# Patient Record
Sex: Female | Born: 1960 | Race: Black or African American | Hispanic: No | Marital: Single | Smoking: Never smoker
Health system: Southern US, Community
[De-identification: ages and names within clinical notes are randomized; demographics above are authoritative.]

## PROBLEM LIST (undated history)

## (undated) DIAGNOSIS — F32A Depression, unspecified: Secondary | ICD-10-CM

## (undated) DIAGNOSIS — D259 Leiomyoma of uterus, unspecified: Secondary | ICD-10-CM

## (undated) DIAGNOSIS — C50919 Malignant neoplasm of unspecified site of unspecified female breast: Secondary | ICD-10-CM

## (undated) DIAGNOSIS — Z9221 Personal history of antineoplastic chemotherapy: Secondary | ICD-10-CM

## (undated) DIAGNOSIS — C50411 Malignant neoplasm of upper-outer quadrant of right female breast: Secondary | ICD-10-CM

## (undated) DIAGNOSIS — F329 Major depressive disorder, single episode, unspecified: Secondary | ICD-10-CM

## (undated) HISTORY — DX: Malignant neoplasm of upper-outer quadrant of right female breast: C50.411

## (undated) HISTORY — DX: Major depressive disorder, single episode, unspecified: F32.9

## (undated) HISTORY — DX: Depression, unspecified: F32.A

## (undated) HISTORY — DX: Leiomyoma of uterus, unspecified: D25.9

## (undated) HISTORY — PX: NECK SURGERY: SHX720

---

## 1998-07-23 ENCOUNTER — Other Ambulatory Visit: Admission: RE | Admit: 1998-07-23 | Discharge: 1998-07-23 | Payer: Self-pay | Admitting: *Deleted

## 1999-10-05 ENCOUNTER — Other Ambulatory Visit: Admission: RE | Admit: 1999-10-05 | Discharge: 1999-10-05 | Payer: Self-pay | Admitting: Obstetrics & Gynecology

## 2000-12-07 ENCOUNTER — Other Ambulatory Visit: Admission: RE | Admit: 2000-12-07 | Discharge: 2000-12-07 | Payer: Self-pay | Admitting: Obstetrics & Gynecology

## 2002-01-31 ENCOUNTER — Encounter: Admission: RE | Admit: 2002-01-31 | Discharge: 2002-01-31 | Payer: Self-pay | Admitting: Obstetrics & Gynecology

## 2002-01-31 ENCOUNTER — Encounter: Payer: Self-pay | Admitting: Obstetrics & Gynecology

## 2003-03-27 ENCOUNTER — Encounter: Admission: RE | Admit: 2003-03-27 | Discharge: 2003-03-27 | Payer: Self-pay | Admitting: Obstetrics & Gynecology

## 2004-05-12 ENCOUNTER — Ambulatory Visit (HOSPITAL_COMMUNITY): Admission: RE | Admit: 2004-05-12 | Discharge: 2004-05-12 | Payer: Self-pay | Admitting: Obstetrics & Gynecology

## 2004-05-14 ENCOUNTER — Encounter: Admission: RE | Admit: 2004-05-14 | Discharge: 2004-05-14 | Payer: Self-pay | Admitting: Family Medicine

## 2004-07-12 ENCOUNTER — Ambulatory Visit (HOSPITAL_COMMUNITY): Admission: RE | Admit: 2004-07-12 | Discharge: 2004-07-13 | Payer: Self-pay | Admitting: Neurosurgery

## 2005-05-16 ENCOUNTER — Ambulatory Visit (HOSPITAL_COMMUNITY): Admission: RE | Admit: 2005-05-16 | Discharge: 2005-05-16 | Payer: Self-pay | Admitting: *Deleted

## 2006-03-08 ENCOUNTER — Ambulatory Visit: Payer: Self-pay | Admitting: Family Medicine

## 2006-03-08 LAB — CONVERTED CEMR LAB
AST: 17 units/L (ref 0–37)
BUN: 9 mg/dL (ref 6–23)
Basophils Absolute: 0 10*3/uL (ref 0.0–0.1)
Basophils Relative: 0.8 % (ref 0.0–1.0)
Bilirubin, Direct: 0.1 mg/dL (ref 0.0–0.3)
Calcium: 9.1 mg/dL (ref 8.4–10.5)
Chloride: 104 meq/L (ref 96–112)
Creatinine, Ser: 0.7 mg/dL (ref 0.4–1.2)
Eosinophils Absolute: 0 10*3/uL (ref 0.0–0.6)
Free T4: 1 ng/dL (ref 0.6–1.6)
HCT: 37.8 % (ref 36.0–46.0)
HDL: 45.9 mg/dL (ref >39.0)
LDL Cholesterol: 130 mg/dL — ABNORMAL HIGH (ref 0–99)
Monocytes Relative: 11.3 % — ABNORMAL HIGH (ref 3.0–11.0)
RBC: 4.42 M/uL (ref 3.87–5.11)
RDW: 14.1 % (ref 11.5–14.6)
T3, Free: 3.2 pg/mL (ref 2.3–4.2)
TSH: 0.65 microintl units/mL (ref 0.35–5.50)
Total CHOL/HDL Ratio: 4.1
Total Protein: 7.5 g/dL (ref 6.0–8.3)
Triglycerides: 65 mg/dL (ref 0–149)

## 2006-03-15 ENCOUNTER — Ambulatory Visit: Payer: Self-pay | Admitting: Family Medicine

## 2006-04-02 ENCOUNTER — Ambulatory Visit: Payer: Self-pay | Admitting: Family Medicine

## 2006-04-05 ENCOUNTER — Ambulatory Visit: Payer: Self-pay | Admitting: Family Medicine

## 2006-05-21 ENCOUNTER — Ambulatory Visit (HOSPITAL_COMMUNITY): Admission: RE | Admit: 2006-05-21 | Discharge: 2006-05-21 | Payer: Self-pay | Admitting: Family Medicine

## 2007-04-23 ENCOUNTER — Telehealth (INDEPENDENT_AMBULATORY_CARE_PROVIDER_SITE_OTHER): Payer: Self-pay | Admitting: *Deleted

## 2007-05-23 ENCOUNTER — Ambulatory Visit: Payer: Self-pay | Admitting: Family Medicine

## 2007-05-23 DIAGNOSIS — F329 Major depressive disorder, single episode, unspecified: Secondary | ICD-10-CM

## 2007-05-23 DIAGNOSIS — F32A Depression, unspecified: Secondary | ICD-10-CM | POA: Insufficient documentation

## 2007-05-28 ENCOUNTER — Ambulatory Visit (HOSPITAL_COMMUNITY): Admission: RE | Admit: 2007-05-28 | Discharge: 2007-05-28 | Payer: Self-pay | Admitting: Obstetrics & Gynecology

## 2007-06-17 ENCOUNTER — Ambulatory Visit: Payer: Self-pay | Admitting: Family Medicine

## 2007-06-17 DIAGNOSIS — R259 Unspecified abnormal involuntary movements: Secondary | ICD-10-CM | POA: Insufficient documentation

## 2008-03-11 DIAGNOSIS — IMO0001 Reserved for inherently not codable concepts without codable children: Secondary | ICD-10-CM

## 2008-03-17 ENCOUNTER — Ambulatory Visit: Payer: Self-pay | Admitting: Family Medicine

## 2008-05-29 ENCOUNTER — Ambulatory Visit (HOSPITAL_COMMUNITY): Admission: RE | Admit: 2008-05-29 | Discharge: 2008-05-29 | Payer: Self-pay | Admitting: Obstetrics & Gynecology

## 2008-11-09 ENCOUNTER — Ambulatory Visit: Payer: Self-pay | Admitting: Family Medicine

## 2008-11-09 LAB — CONVERTED CEMR LAB
ALT: 11 units/L (ref 0–35)
Albumin: 3.6 g/dL (ref 3.5–5.2)
Alkaline Phosphatase: 40 units/L (ref 39–117)
BUN: 8 mg/dL (ref 6–23)
Bilirubin Urine: NEGATIVE
Bilirubin, Direct: 0.1 mg/dL (ref 0.0–0.3)
CO2: 28 meq/L (ref 19–32)
Calcium: 8.6 mg/dL (ref 8.4–10.5)
Chloride: 104 meq/L (ref 96–112)
Cholesterol: 196 mg/dL (ref 0–200)
Eosinophils Absolute: 0 10*3/uL (ref 0.0–0.7)
GFR calc non Af Amer: 114.9 mL/min (ref >60)
Glucose, Bld: 90 mg/dL (ref 70–99)
HCT: 36.2 % (ref 36.0–46.0)
Lymphocytes Relative: 22.4 % (ref 12.0–46.0)
Lymphs Abs: 0.7 10*3/uL (ref 0.7–4.0)
Specific Gravity, Urine: 1.025 (ref 1.000–1.030)
Total Bilirubin: 0.5 mg/dL (ref 0.3–1.2)
Total CHOL/HDL Ratio: 5
WBC: 3.1 10*3/uL — ABNORMAL LOW (ref 4.5–10.5)
pH: 6.5 (ref 5.0–8.0)

## 2008-11-19 ENCOUNTER — Ambulatory Visit: Payer: Self-pay | Admitting: Family Medicine

## 2008-11-19 DIAGNOSIS — D259 Leiomyoma of uterus, unspecified: Secondary | ICD-10-CM

## 2008-11-19 DIAGNOSIS — K625 Hemorrhage of anus and rectum: Secondary | ICD-10-CM

## 2008-11-20 ENCOUNTER — Encounter (INDEPENDENT_AMBULATORY_CARE_PROVIDER_SITE_OTHER): Payer: Self-pay | Admitting: *Deleted

## 2008-12-16 ENCOUNTER — Encounter (INDEPENDENT_AMBULATORY_CARE_PROVIDER_SITE_OTHER): Payer: Self-pay | Admitting: *Deleted

## 2008-12-16 ENCOUNTER — Ambulatory Visit: Payer: Self-pay | Admitting: Gastroenterology

## 2008-12-16 DIAGNOSIS — K921 Melena: Secondary | ICD-10-CM

## 2008-12-16 DIAGNOSIS — K5900 Constipation, unspecified: Secondary | ICD-10-CM | POA: Insufficient documentation

## 2009-01-06 ENCOUNTER — Ambulatory Visit: Payer: Self-pay | Admitting: Gastroenterology

## 2009-06-15 ENCOUNTER — Ambulatory Visit (HOSPITAL_COMMUNITY): Admission: RE | Admit: 2009-06-15 | Discharge: 2009-06-15 | Payer: Self-pay | Admitting: Family Medicine

## 2009-06-15 LAB — HM MAMMOGRAPHY

## 2009-10-08 ENCOUNTER — Ambulatory Visit: Payer: Self-pay | Admitting: Family Medicine

## 2009-10-08 DIAGNOSIS — F988 Other specified behavioral and emotional disorders with onset usually occurring in childhood and adolescence: Secondary | ICD-10-CM | POA: Insufficient documentation

## 2009-10-14 ENCOUNTER — Telehealth: Payer: Self-pay | Admitting: Family Medicine

## 2009-10-15 ENCOUNTER — Telehealth: Payer: Self-pay | Admitting: Family Medicine

## 2009-10-20 ENCOUNTER — Encounter: Payer: Self-pay | Admitting: Family Medicine

## 2009-11-22 ENCOUNTER — Telehealth: Payer: Self-pay | Admitting: Family Medicine

## 2009-11-25 ENCOUNTER — Telehealth: Payer: Self-pay | Admitting: Family Medicine

## 2009-12-14 ENCOUNTER — Ambulatory Visit: Payer: Self-pay | Admitting: Family Medicine

## 2009-12-14 DIAGNOSIS — M5382 Other specified dorsopathies, cervical region: Secondary | ICD-10-CM | POA: Insufficient documentation

## 2009-12-17 ENCOUNTER — Encounter
Admission: RE | Admit: 2009-12-17 | Discharge: 2010-01-12 | Payer: Self-pay | Source: Home / Self Care | Attending: Family Medicine | Admitting: Family Medicine

## 2009-12-20 ENCOUNTER — Encounter: Payer: Self-pay | Admitting: Family Medicine

## 2009-12-28 ENCOUNTER — Ambulatory Visit: Payer: Self-pay | Admitting: Family Medicine

## 2010-01-19 ENCOUNTER — Telehealth: Payer: Self-pay | Admitting: Family Medicine

## 2010-01-19 ENCOUNTER — Encounter
Admission: RE | Admit: 2010-01-19 | Discharge: 2010-01-19 | Payer: Self-pay | Source: Home / Self Care | Attending: Family Medicine | Admitting: Family Medicine

## 2010-01-20 ENCOUNTER — Telehealth: Payer: Self-pay | Admitting: Family Medicine

## 2010-02-15 NOTE — Progress Notes (Signed)
Summary: Pt says [facility]. Will pick up written   Phone Note Call from Patient Call back at Home Phone [phone]   Caller: Patient Summary of Call: [facility] [address] and no script for Ce[facility]a had been recvd. Pls print out script for Celexa, and pt will pick up at same time she picks up script for the Adderall.  Initial call taken by: Lucy Antigua,  November 25, 2009 10:30 AM    Prescriptions: CELEXA 40 MG  TABS (CITALOPRAM HYDROBROMIDE) 1 tab @ bedtime  #100 Tablet x 3   Entered by:   Kern Reap CMA (AAMA)   Authorized by:   Roderick Pee MD   Signed by:   Kern Reap CMA (AAMA) on 11/25/2009   Method used:   Electronically to        [id number]       [address]       [place], [place]  [address]       Ph: [phone] or [phone]       Fax: [phone]   RxID:   [id number]

## 2010-02-15 NOTE — Progress Notes (Signed)
Summary: REFILL REQUEST  Phone Note Refill Request Message from:  Patient     [phone] on November 22, 2009 12:23 PM  Refills Requested: Medication #1:  CELEXA 40 MG  TABS 1 tab @ bedtime   [facility]  Medication #2:  AMPHETAMINE-DEXTROAMPHETAMINE 10 MG TABS Take 1 tablet by mouth two times a day   Notes: Pt can be reached at [phone] when Rx is ready for p/u.    Initial call taken by: Debbra Riding,  November 22, 2009 12:26 PM  Follow-up for Phone Call        tried to leave a message for the patient to see if she has called the pharmacy to see which dose is available for the Adderall. Follow-up by: Kern Reap CMA Duncan Dull),  November 23, 2009 10:30 AM  Additional Follow-up for Phone Call Additional follow up Details #1::        call [phone]. Additional Follow-up by: Warnell Forester,  November 23, 2009 11:05 AM    New/Updated Medications: AMPHETAMINE-DEXTROAMPHETAMINE 15 MG TABS (AMPHETAMINE-DEXTROAMPHETAMINE) take one tab by mouth once daily   fill in one month AMPHETAMINE-DEXTROAMPHETAMINE 15 MG TABS (AMPHETAMINE-DEXTROAMPHETAMINE) take one tab by mouth once daily   fill in two months Prescriptions: AMPHETAMINE-DEXTROAMPHETAMINE 15 MG TABS (AMPHETAMINE-DEXTROAMPHETAMINE) take one tab by mouth once daily   fill in two months  #30 x 0   Entered by:   Kern Reap CMA (AAMA)   Authorized by:   Roderick Pee MD   Signed by:   Kern Reap CMA (AAMA) on 11/23/2009   Method used:   Print then Give to Patient   RxID:   [id number] AMPHETAMINE-DEXTROAMPHETAMINE 15 MG TABS (AMPHETAMINE-DEXTROAMPHETAMINE) take one tab by mouth once daily   fill in one month  #30 x 0   Entered by:   Kern Reap CMA (AAMA)   Authorized by:   Roderick Pee MD   Signed by:   Kern Reap CMA (AAMA) on 11/23/2009   Method used:   Print then Give to Patient   RxID:   [id number] AMPHETAMINE-DEXTROAMPHETAMINE 15 MG TABS (AMPHETAMINE-DEXTROAMPHETAMINE)  take one tab by mouth once daily  #30 x 0   Entered by:   Kern Reap CMA (AAMA)   Authorized by:   Roderick Pee MD   Signed by:   Kern Reap CMA (AAMA) on 11/23/2009   Method used:   Print then Give to Patient   RxID:   [id number]

## 2010-02-15 NOTE — Progress Notes (Signed)
Summary: Pt said [facility]  Phone Note Call from Patient Call back at [facility] Phone [phone]   Caller: Patient Summary of Call: Pt called and she took [facility]. All pharmacy so far have said that they are out of stock and recommended a diff med be prescribed.  Initial call taken by: Lucy Antigua,  October 14, 2009 10:38 AM  Follow-up for Phone Call        Fleet Contras please call have her try [facility] or  another pharmacy Follow-up by: Roderick Pee MD,  October 14, 2009 11:06 AM  Additional Follow-up for Phone Call Additional follow up Details #1::        tried to call home number but machine is full.  she could also see if the adderall comes in a diffferent dose Additional Follow-up by: Kern Reap CMA Duncan Dull),  October 14, 2009 11:18 AM    Additional Follow-up for Phone Call Additional follow up Details #2::    patient checked and insurance will cover 5mg  Follow-up by: Kern Reap CMA Duncan Dull),  October 14, 2009 2:40 PM  New/Updated Medications: AMPHETAMINE-DEXTROAMPHETAMINE 5 MG TABS (AMPHETAMINE-DEXTROAMPHETAMINE) take one tab by mouth two times a day Prescriptions: AMPHETAMINE-DEXTROAMPHETAMINE 5 MG TABS (AMPHETAMINE-DEXTROAMPHETAMINE) take one tab by mouth two times a day  #60 x 0   Entered by:   Kern Reap CMA (AAMA)   Authorized by:   Roderick Pee MD   Signed by:   Kern Reap CMA (AAMA) on 10/14/2009   Method used:   Print then Give to Patient   RxID:   [id number]

## 2010-02-15 NOTE — Progress Notes (Signed)
Summary: refill  Phone Note Call from Patient   Summary of Call: still unable to get the dose of generic adderall  Follow-up for Phone Call        7.5,12.5,15 mg 10 brand name available Follow-up by: Kern Reap CMA Duncan Dull),  October 15, 2009 4:39 PM  Additional Follow-up for Phone Call Additional follow up Details #1::        rx for 5mg  brought in by patient and discarded. Additional Follow-up by: Kern Reap CMA Duncan Dull),  October 15, 2009 4:47 PM    New/Updated Medications: AMPHETAMINE-DEXTROAMPHETAMINE 15 MG TABS (AMPHETAMINE-DEXTROAMPHETAMINE) take one tab by mouth once daily Prescriptions: AMPHETAMINE-DEXTROAMPHETAMINE 15 MG TABS (AMPHETAMINE-DEXTROAMPHETAMINE) take one tab by mouth once daily  #30 x 0   Entered by:   Kern Reap CMA (AAMA)   Authorized by:   Roderick Pee MD   Signed by:   Kern Reap CMA (AAMA) on 10/15/2009   Method used:   Print then Give to Patient   RxID:   [id number]

## 2010-02-15 NOTE — Assessment & Plan Note (Signed)
Summary: CPX // RS rsc per doc/njr   Vital Signs:  Patient profile:   50 year old female Menstrual status:  regular Height:      63.25 inches Weight:      174 pounds BMI:     30.69 Temp:     98.5 degrees F oral BP sitting:   120 / 76  (left arm) Cuff size:   regular  Vitals Entered By: Kern Reap CMA Duncan Dull) (October 08, 2009 9:08 AM) CC: cpx Is Patient Diabetic? No   Primary Care Provider:  Eugenio Hoes. Tawanna Cooler, MD   CC:  cpx.  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  She's had no chronic health problems.  Last year.  We saw her because of some rectal bleeding.  Colonoscopy was normal except for some internal hemorrhoids.  As long as she avoids constipation, then she has no bleeding.  Advised to take a stool softener on a daily basis.  She has a large fibroid, which extends just below her umbilicus.  She is on BCPs via GYN.  They are encouraging her to have a hysterectomy however, she is asymptomatic.  In reviewing her medical history she has a long-standing history of very subtle ADD.  She states she has difficulty sitting and reading and focusing and concentrating.  She's been able to overcome this.  She went to high school college in graduate school and did well, but no she could have done better.  She now has a job where she has multiple tests performed on a daily basis and is having extreme difficulty focusing and concentrating.  We discussed options.  We will give her a trial of Adderall.  She gets routine eye care, dental care, and you mammography, pelvic, by GYN, tetanus booster today.  Seasonal flu shot today  Allergies (verified): No Known Drug Allergies  Past History:  Past medical, surgical, family and social histories (including risk factors)  reviewed, and no changes noted (except as noted below).  Past Medical History: Reviewed history from 12/16/2008 and no changes required. Depression Uterine fibroid  Past Surgical History: Reviewed history from 12/16/2008 and no changes required. Lumbar disk surgery  Family History: Reviewed history from 12/16/2008 and no changes required. Family History of Alcoholism/Addiction No FH of Colon Cancer:  Social History: Reviewed history from 12/16/2008 and no changes required. 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 No apparent alopecia or balding. Eyes:  No corneal or conjunctival inflammation noted. EOMI. Perrla. Funduscopic exam benign, without hemorrhages, exudates or papilledema. Vision grossly normal. Ears:  External ear exam shows no significant lesions or deformities.  Otoscopic examination reveals clear canals, tympanic membranes are intact bilaterally without bulging, retraction, inflammation or discharge. Hearing is grossly normal bilaterally. Nose:  External nasal examination shows no deformity or inflammation. Nasal mucosa are pink and moist without lesions or exudates. Mouth:  Oral mucosa and oropharynx without lesions or exudates.  Teeth in good repair. Neck:  No deformities, masses, or tenderness noted. Chest Wall:  No deformities, masses, or tenderness  noted. Breasts:  No mass, nodules, thickening, tenderness, bulging, retraction, inflamation, nipple discharge or skin changes noted.   Lungs:  Normal respiratory effort, chest expands symmetrically. Lungs are clear to auscultation, no crackles or wheezes. Heart:  Normal rate and regular  rhythm. S1 and S2 normal without gallop, murmur, click, rub or other extra sounds. Abdomen:  abdominal exam negative, except they can palpate her uterus is just below the umbilicus Msk:  No deformity or scoliosis noted of thoracic or lumbar spine.   Pulses:  R and L carotid,radial,femoral,dorsalis pedis and posterior tibial pulses are full and equal bilaterally Extremities:  No clubbing, cyanosis, edema, or deformity noted with normal full range of motion of all joints.   Neurologic:  No cranial nerve deficits noted. Station and gait are normal. Plantar reflexes are down-going bilaterally. DTRs are symmetrical throughout. Sensory, motor and coordinative functions appear intact. Cervical Nodes:  No lymphadenopathy noted Axillary Nodes:  No palpable lymphadenopathy Inguinal Nodes:  No significant adenopathy Psych:  Cognition and judgment appear intact. Alert and cooperative with normal attention span and concentration. No apparent delusions, illusions, hallucinations   Impression & Recommendations:  Problem # 1:  Preventive Health Care (ICD-V70.0) Assessment Unchanged  Problem # 2:  ATTENTION DEFICIT DISORDER, ADULT (ICD-314.00) Assessment: New  Complete Medication List: 1)  Celexa 40 Mg Tabs (Citalopram hydrobromide) .Marland Kitchen.. 1 tab @ bedtime 2)  Bee Pollen 580 Mg Caps (Bee pollen) .... Once daily 3)  Phillips Milk of Magnesia 7.75 % Susp (Magnesium hydroxide) .... Once or twice a week as needed 4)  Cryselle-28 0.3-30 Mg-mcg Tabs (Norgestrel-ethinyl estradiol) .... Take one tab once daily 5)  Amphetamine-dextroamphetamine 10 Mg Tabs (Amphetamine-dextroamphetamine) .... Take 1 tablet by mouth two times a day  Other Orders: Tdap => 8yrs IM (42595) Admin 1st Vaccine (63875) Flu Vaccine 58yrs + (64332) Admin of Any Addtl Vaccine (95188)  Patient Instructions: 1)  begin Adderall 10 mg twice daily.  Return in two weeks for follow-up. 2)  Take an Aspirin every day. 3)  Please schedule a  follow-up appointment in 1 year. Prescriptions: AMPHETAMINE-DEXTROAMPHETAMINE 10 MG TABS (AMPHETAMINE-DEXTROAMPHETAMINE) Take 1 tablet by mouth two times a day  #60 x 0   Entered and Authorized by:   Roderick Pee MD   Signed by:   Roderick Pee MD on 10/08/2009   Method used:   Print then Give to Patient   RxID:   [id number]    Immunizations Administered:  Tetanus Vaccine:    Vaccine Type: Tdap    Site: right deltoid    Mfr: GlaxoSmithKline    Dose: 0.5 ml    Route: IM    Given by: Kern Reap CMA (AAMA)    Exp. Date: 11/05/2011    Lot #: [id number]    Physician counseled: yes  Influenza Vaccine # 1:    Vaccine Type: Fluvax 3+    Site: left deltoid    Mfr: GlaxoSmithKline    Dose: 0.5 ml    Route: IM    Given by: Kern Reap CMA (AAMA)    Exp. Date: 07/16/2010    Lot #: [id number]    VIS given: 08/10/09 version given October 08, 2009.    Physician counseled: yes

## 2010-02-15 NOTE — Assessment & Plan Note (Signed)
Summary: arm issues//ccm   Vital Signs:  Patient profile:   50 year old female Menstrual status:  regular Weight:      172 pounds Temp:     98.0 degrees F oral BP sitting:   120 / 80  (left arm) Cuff size:   regular  Vitals Entered By: Kern Reap CMA Duncan Dull) (December 14, 2009 2:45 PM) CC: right arm pain   Primary Care Provider:  Eugenio Hoes. Tawanna Cooler, MD   CC:  right arm pain.  History of Present Illness: Misty Snyder is a 50 year old female, who comes in today for evaluation of pain in her right shoulder x 2 weeks.  She states about 5 years ago.  She had a cervical disk removed by Dr. Dutch Quint her neurosurgeon.  She states she told him at that time, but there was another disk involved.  She's been symptom-free until about 3 weeks ago.  It started gradually.  Now the pain is constant, dull, a 7 on a scale of one to 10 and is in her right shoulder.  She states she feels some tingling in her right hand  Allergies: No Known Drug Allergies  Past History:  Past medical, surgical, family and social histories (including risk factors) reviewed for relevance to current acute and chronic problems.  Past Medical History: Reviewed history from 12/16/2008 and no changes required. Depression Uterine fibroid  Past Surgical History: Reviewed history from 12/16/2008 and no changes required. Lumbar disk surgery  Family History: Reviewed history from 12/16/2008 and no changes required. Family History of Alcoholism/Addiction No FH of Colon Cancer:  Social History: Reviewed history from 12/16/2008 and no changes required. 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  No apparent alopecia or balding. Eyes:  No corneal or conjunctival inflammation noted. EOMI. Perrla. Funduscopic exam benign, without hemorrhages, exudates or papilledema. Vision grossly normal. Ears:  External ear exam shows no significant lesions or deformities.  Otoscopic examination reveals clear canals, tympanic membranes are intact bilaterally without bulging, retraction, inflammation or discharge. Hearing is grossly normal bilaterally. Nose:  External nasal examination shows no deformity or inflammation. Nasal mucosa are pink and moist without lesions or exudates. Mouth:  Oral mucosa and oropharynx without lesions or exudates.  Teeth in good repair. Neck:  No deformities, masses, or tenderness noted. Chest Wall:  No deformities, masses, or tenderness noted. Msk:  No deformity or scoliosis noted of thoracic or lumbar spine.   Pulses:  R and L carotid,radial,femoral,dorsalis pedis and posterior tibial pulses are full and equal bilaterally Extremities:  No clubbing, cyanosis, edema, or deformity noted with normal full range of motion of all joints.   Neurologic:  No cranial nerve deficits noted. Station and gait are normal. Plantar reflexes are down-going bilaterally. DTRs are symmetrical throughout. Sensory, motor and coordinative functions appear intact.   Problems:  Medical Problems Added: 1)  Dx of Cervical Disorder, Nos  (ICD-723.9)  Impression & Recommendations:  Problem # 1:  CERVICAL DISORDER, NOS (ICD-723.9) Assessment New  Orders: Physical Therapy Referral (PT)  Complete Medication List: 1)  Celexa 40 Mg Tabs (Citalopram hydrobromide) .Marland Kitchen.. 1 tab @ bedtime 2)  Bee Pollen 580 Mg Caps (Bee pollen) .... Once daily 3)  Phillips Milk of Magnesia 7.75 % Susp (Magnesium  hydroxide) .... Once or twice a week as needed 4)  Cryselle-28 0.3-30 Mg-mcg Tabs (Norgestrel-ethinyl estradiol) .... Take one tab once daily 5)  Amphetamine-dextroamphetamine 15 Mg Tabs  (Amphetamine-dextroamphetamine) .... Take one tab by mouth once daily 6)  Amphetamine-dextroamphetamine 15 Mg Tabs (Amphetamine-dextroamphetamine) .... Take one tab by mouth once daily   fill in one month 7)  Amphetamine-dextroamphetamine 15 Mg Tabs (Amphetamine-dextroamphetamine) .... Take one tab by mouth once daily   fill in two months 8)  Flexeril 10 Mg Tabs (Cyclobenzaprine hcl) .Marland Kitchen.. 1 tab @ bedtime 9)  Vicodin Es 7.5-750 Mg Tabs (Hydrocodone-acetaminophen) .Marland Kitchen.. 1 tab @ bedtime  Patient Instructions: 1)  takes 600 mg of Motrin twice daily with food and one half of a Vicodin and Flexeril at bedtime.  Also wear a soft cervical collar at bedtime. 2)  We will get you set up to start physical therapy ASAP. 3)  Return in two weeks for follow-up, sooner if any problems Prescriptions: VICODIN ES 7.5-750 MG TABS (HYDROCODONE-ACETAMINOPHEN) 1 tab @ bedtime  #30 x 1   Entered and Authorized by:   Roderick Pee MD   Signed by:   Roderick Pee MD on 12/14/2009   Method used:   Print then Give to Patient   RxID:   [id number] FLEXERIL 10 MG TABS (CYCLOBENZAPRINE HCL) 1 tab @ bedtime  #30 x 1   Entered and Authorized by:   Roderick Pee MD   Signed by:   Roderick Pee MD on 12/14/2009   Method used:   Print then Give to Patient   RxID:   [id number]    Orders Added: 1)  Physical Therapy Referral [PT] 2)  Est. Patient Level III [16010]

## 2010-02-15 NOTE — Miscellaneous (Signed)
Summary: Initial Summary for PT Services/[facility]  Initial Summary for PT Services/[facility]   Imported By: Maryln Gottron 12/22/2009 13:50:45  _____________________________________________________________________  External Attachment:    Type:   Image     Comment:   External Document

## 2010-02-15 NOTE — Medication Information (Signed)
Summary: Amphetamine Salt Combo Approved  Amphetamine Salt Combo Approved   Imported By: Maryln Gottron 10/25/2009 10:41:15  _____________________________________________________________________  External Attachment:    Type:   Image     Comment:   External Document

## 2010-02-17 NOTE — Progress Notes (Signed)
Summary: refill  Phone Note Refill Request   Refills Requested: Medication #1:  AMPHETAMINE-DEXTROAMPHETAMINE 15 MG TABS take one tab by mouth once daily    New/Updated Medications: AMPHETAMINE-DEXTROAMPHETAMINE 20 MG TABS (AMPHETAMINE-DEXTROAMPHETAMINE) take one tab once daily AMPHETAMINE-DEXTROAMPHETAMINE 20 MG TABS (AMPHETAMINE-DEXTROAMPHETAMINE) take one tab once daily  fill in one month AMPHETAMINE-DEXTROAMPHETAMINE 20 MG TABS (AMPHETAMINE-DEXTROAMPHETAMINE) take one tab once daily fill in two months Prescriptions: AMPHETAMINE-DEXTROAMPHETAMINE 20 MG TABS (AMPHETAMINE-DEXTROAMPHETAMINE) take one tab once daily fill in two months  #30 x 0   Entered by:   Kern Reap CMA (AAMA)   Authorized by:   Roderick Pee MD   Signed by:   Kern Reap CMA (AAMA) on 01/20/2010   Method used:   Print then Give to Patient   RxID:   [id number] AMPHETAMINE-DEXTROAMPHETAMINE 20 MG TABS (AMPHETAMINE-DEXTROAMPHETAMINE) take one tab once daily  fill in one month  #30 x 0   Entered by:   Kern Reap CMA (AAMA)   Authorized by:   Roderick Pee MD   Signed by:   Kern Reap CMA (AAMA) on 01/20/2010   Method used:   Print then Give to Patient   RxID:   [id number] AMPHETAMINE-DEXTROAMPHETAMINE 20 MG TABS (AMPHETAMINE-DEXTROAMPHETAMINE) take one tab once daily  #30 x 0   Entered by:   Kern Reap CMA (AAMA)   Authorized by:   Roderick Pee MD   Signed by:   Kern Reap CMA (AAMA) on 01/20/2010   Method used:   Print then Give to Patient   RxID:   [id number]

## 2010-02-17 NOTE — Progress Notes (Signed)
Summary: Change to Adderall dosage & Refill  Phone Note Call from Patient   Reason for Call: Refill Medication, Talk to Nurse, Talk to Doctor Summary of Call: Pt. is out of Adderall needs refill & wants to see if she can start being prescribed for 20mg  instead of 15mg . Please call her at [phone]. Thanks! Says the 15mg  is not working for her. [facility] Initial call taken by: Georgian Co,  January 19, 2010 8:28 AM  Follow-up for Phone Call        ok Follow-up by: Roderick Pee MD,  January 20, 2010 7:49 AM     Appended Document: Change to Adderall dosage & Refill ok

## 2010-02-25 ENCOUNTER — Telehealth: Payer: Self-pay | Admitting: Family Medicine

## 2010-02-25 DIAGNOSIS — M791 Myalgia, unspecified site: Secondary | ICD-10-CM

## 2010-02-25 MED ORDER — CYCLOBENZAPRINE HCL 10 MG PO TABS: 10.0000 mg | ORAL_TABLET | Freq: Every evening | ORAL | Status: DC | PRN

## 2010-02-25 MED ORDER — CYCLOBENZAPRINE HCL 10 MG PO TABS: 10.0000 mg | ORAL_TABLET | Freq: Every evening | ORAL | Status: AC | PRN

## 2010-02-25 NOTE — Telephone Encounter (Signed)
Pt needs refill on flexeril 10mg 

## 2010-04-11 ENCOUNTER — Telehealth: Payer: Self-pay | Admitting: Family Medicine

## 2010-04-11 NOTE — Telephone Encounter (Signed)
Pt requesting rx refill of flexeril 10mg  and vicodin 7.5/750 sent to [facility] [address]

## 2010-04-11 NOTE — Telephone Encounter (Signed)
Office visit for evaluation

## 2010-04-21 ENCOUNTER — Telehealth: Payer: Self-pay | Admitting: Family Medicine

## 2010-04-21 MED ORDER — HYDROCODONE-ACETAMINOPHEN 7.5-750 MG PO TABS: ORAL_TABLET | ORAL | Status: DC

## 2010-04-21 MED ORDER — CYCLOBENZAPRINE HCL 10 MG PO TABS: ORAL_TABLET | ORAL | Status: DC

## 2010-04-21 NOTE — Telephone Encounter (Signed)
Flexeril 10 mg, number 10, directions one half tab nightly no refills.  Vicodin ES, number 10, directions one half to one tablet at bedtime for severe pain.  No refills.  Office visit next week to assess

## 2010-04-21 NOTE — Telephone Encounter (Signed)
Okay to fill? 

## 2010-04-21 NOTE — Telephone Encounter (Signed)
Pt needs refill flexeril 10mg  and vicodin es 7.5-750mg . [facility] [address] [phone]

## 2010-04-26 ENCOUNTER — Ambulatory Visit (INDEPENDENT_AMBULATORY_CARE_PROVIDER_SITE_OTHER): Payer: BLUE CROSS/BLUE SHIELD | Admitting: Family Medicine

## 2010-04-26 DIAGNOSIS — F988 Other specified behavioral and emotional disorders with onset usually occurring in childhood and adolescence: Secondary | ICD-10-CM

## 2010-04-26 DIAGNOSIS — M5382 Other specified dorsopathies, cervical region: Secondary | ICD-10-CM

## 2010-04-26 MED ORDER — HYDROCODONE-ACETAMINOPHEN 7.5-750 MG PO TABS: ORAL_TABLET | ORAL | Status: DC

## 2010-04-26 MED ORDER — AMPHETAMINE-DEXTROAMPHETAMINE 20 MG PO TABS: ORAL_TABLET | ORAL | Status: DC

## 2010-04-26 MED ORDER — CYCLOBENZAPRINE HCL 10 MG PO TABS: ORAL_TABLET | ORAL | Status: DC

## 2010-04-26 NOTE — Patient Instructions (Signed)
Continue your current medications except increase the Adderall 20 mg twice daily.  Take a half of the Flexeril and/or Vicodin at bedtime as needed for pain and call Dr. Dalphine Handing office to make an appointment to see him for further evaluation.  Return in September for your annual exam

## 2010-04-26 NOTE — Progress Notes (Signed)
  Subjective:    Patient ID: Misty Snyder, female    DOB: Jun 17, 1960, 50 y.o.   MRN: [id number]  HPIStephanie is a 50 year old single female, who comes in to discuss two problems.  She has a long-standing history of cervical disk disease.  She's had lumbar disk surgery by Dr. Dutch Quint and now has take Flexeril and Vicodin intermittently for neck pain.  We sent her for physical therapy, which he said did not help.  Advised to go back and see Dr. Dutch Quint to have further evaluation.  She also has a history of adult ADD.  She's currently on the short acting Adderall 20 mg daily.  She would like to increase the dose to twice daily because it wears off very quickly.  We also talked about long-term medication    Review of Systems General musculoskeletal and neurologic review of systems otherwise negative    Objective:   Physical Exam    Well-developed female in no acute distress    Assessment & Plan:  Adult ADD, increase the Inderal 20 b.i.d.  Cervical disk disease, Flexeril, and Vicodin p.r.n. Neurosurgical consult for further evaluation.  Return for CPX in the fall

## 2010-05-03 ENCOUNTER — Other Ambulatory Visit: Payer: Self-pay | Admitting: Family Medicine

## 2010-05-03 DIAGNOSIS — Z1231 Encounter for screening mammogram for malignant neoplasm of breast: Secondary | ICD-10-CM

## 2010-06-03 NOTE — Assessment & Plan Note (Signed)
[facility] OFFICE NOTE   NAME:Dantes, ETHERINE MACKOWIAK                   MRN:          [id number]  DATE:03/08/2006                            DOB:          11/09/1960    Senie is a delightful 50 year old single female, gravida 0, para 0,  AB 0 comes in today new patient for evaluation of fatigue, depression,  mood swings, and hypersomnia, PMS-type symptoms all month long, question  thyroid disease.   The patient was hospitalized for back surgery in 2006, recovered without  neurological sequela.   SURGERIES:  None.   ILLNESSES:  None.   INJURIES:  None.   DRUG ALLERGIES:  None.   Does not smoke or drink any alcohol.  Does not use any drugs.   CURRENT MEDICATIONS:  Lo/Ovral BCP.   Menses are regular.  No problem.  Last Pap smear was March 02, 2006.  [facility].   REVIEW OF SYSTEMS:  HEAD, EARS, EYES, NOSE, AND THROAT:  Negative,  except she wears glasses for distance vision.  She gets regular dental  care.  CARDIOPULMONARY:  Negative.  GASTROINTESTINAL:  Negative.  GENITOURINARY:  Negative.  GYN:  She is a gravida 0, para 0.  Menses are  regular.  She is on birth control pills.  She is being seen on a regular  basis and gets a regular mammogram.  ENDOCRINE:  Negative.  Weight  negative.  MUSCULOSKELETAL:  Negative.  VASCULAR:  Negative.  ALLERGY:  Negative.  PSYCH:  The patient complains of fatigue, depression, mood  swings since she has taken over the care of her nephew.  This is her  brother's child.  Her brother is in jail for drug dealing until 2011.  She has assumed the care of the child, who is now 41 years old because  the biological mother is incapable of caring for the child.  She has had  him as a foster child for 2 years, and now she is in the process of  adoption.  She thinks this has triggered all her symptoms.  She is  currently a Geophysicist/field seismologist at [employer].  She is also  going to [employer].   FAMILY HISTORY:  Dad 82, has a history of alcoholism.  Mother is 31,  alive and well, 1 brother with a history of drug addiction.  As noted  above, he is in jail until 2011.  No sisters.   LAST TETANUS:  2002.   PHYSICAL EVALUATION:  Height 5 feet 2 inches.  Weight 173, BP 113/77,  pulse 70 and regular.  She is afebrile.  GENERAL:  She is a well-developed, well-nourished female in no acute  distress.  HEAD, EARS, EYES, NOSE, AND THROAT:  Negative.  NECK:  Supple.  Thyroid is not enlarged.  No carotid bruits.  CHEST:  Clear to auscultation.  CARDIAC:  Negative.  BREASTS:  Negative.  ABDOMEN:  Negative.  PELVIC:  Pelvic, etc., done in __________ negative.  Therefore, not  repeated.  EXTREMITIES:  Normal.  SKIN:  Normal.   IMPRESSION:  Fatigue, mood swings, depression, et Karie Soda, triggered by  care of her 68-year-old boy, whom she is now in the process of adopting.   PLAN:  We will get diagnostic laboratory studies, start on Celexa 20 mg  1/2 tablet nightly.  Follow up in 1 week.  45 minutes was spent with her  going through her H&P et Karie Soda.     Jeffrey A. Tawanna Cooler, MD  Electronically Signed    JAT/MedQ  DD: 03/08/2006  DT: 03/08/2006  Job #: [id number]

## 2010-06-03 NOTE — Op Note (Signed)
NAMESERIAH, BROTZMAN            ACCOUNT NO.:  [id number]   MEDICAL RECORD NO.:  [id number]          PATIENT TYPE:  OIB   LOCATION:  3006                         FACILITY:  [facility]   PHYSICIAN:  Kathaleen Maser. Pool, M.D.    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   PROCEDURE:  C5-6 anterior cervical discectomy and fusion with allograft  anterior plating.   SURGEON:  Kathaleen Maser. Pool, M.D.   Threasa HeadsYetta Barre.   ANESTHESIA:  General endotracheal anesthesia.   INDICATIONS FOR PROCEDURE:  Ms. Grunow is a 50 year old female with history  of a neck pain and upper extremity symptoms.  Work-up demonstrates evidence  of a large central disc herniation at C5-6 with marked spinal cord  compression.  Patient presents now for a C5-6 anterior cervical discectomy  and fusion, allograft anterior plating.   DESCRIPTION OF PROCEDURE:  Patient brought to the operating room in supine  position, after an adequate level of anesthesia achieved, patient was supine  with her neck slightly extended to help with Halter traction.  Patient's  anterior cervical region is prepped and draped sterilely.  A 10 blade was  used to make a linear skin incision overlying the C5-6 inner space.  This  was carried down sharply to the platysma.  Platysma was then divided  vertically and dissection proceeded along the medial border of the  sternocleidomastoid muscle and carotid sheath.  Trachea and esophagus were  mobilized and tracked toward the left.  Prevertebral fascia was __________  anterior spinal column.  Longus colli muscle was then elevated bilaterally  using electrocautery.  Deep self-retaining retractor was placed,  intraoperative fluoroscopy was used and the C5-6 level was  confirmed.  Disc  space was then incised with a 15 blade in a rectangular fashion.  A wide  disc space cleanout was then achieved using pituitary rongeurs forward and  backward ____________ Kerrison rongeurs and high speed drill.  All elements  of the disc were removed down to the level of the posterior annulus.  The  microscope was brought into the field and used throughout the remainder of  the discectomy.  Remaining aspects of the annulus and osteophytes removed  using the high speed drill down to the level of the posterior longitudinal  ligament.  Posterior longitudinal ligament was then elevated and resected in  piecemeal fashion using Kerrison rongeurs.  Underlying thecal sac was then  identified.  A large amount of free disc herniation was encountered and  completely resected.  A wide central decompression was then was then  performed by undercutting the bodies of C5 and C6 using Kerrison rongeurs.  Decompression then proceeded on each neural foramen.  Wide anterior  foraminotomy was then performed along the course of exiting nerve roots  bilaterally.  At this point, a very thorough decompression had been  achieved.  There was no obvious injury to  thecal sac and nerve roots.  Wound  was then irrigated with antibiotic solution.  Gelfoam was placed for  hemostasis and found to be good.  A 6 mm LifeNet allograft wedge was then  impacted into place and recessed approximately 1 mm from the anterior  cervical margin.  A 25 mm Atlantis anterior cervical plate was placed over  the C5 and C6 levels.  This then attached under fluoroscopic guidance using  13 mm variable angle screws, two each of both levels.  All four screws given  a final tightening and found to be solidly in bone.  Locking screws then  engaged.  Final images reveal good position of the bone grafts and hardware  and with proper alignment of the spine.  Wound was then irrigated with  antibiotic solution.  Hemostasis was insured  with bipolar cautery.  Wound  was then closed in typical fashion.  Steri-Strips and sterile dressing were  applied.  There were no apparent complications. The patient tolerated the  procedure well and she returns to the recovery room postoperatively.       HAP/MEDQ  D:  07/12/2004  T:  07/12/2004  Job:  [id number]

## 2010-06-20 ENCOUNTER — Ambulatory Visit (HOSPITAL_COMMUNITY)
Admission: RE | Admit: 2010-06-20 | Discharge: 2010-06-20 | Disposition: A | Discharge status: Home / Self Care | Payer: BLUE CROSS/BLUE SHIELD | Source: Ambulatory Visit | Attending: Family Medicine | Admitting: Family Medicine

## 2010-06-20 DIAGNOSIS — Z1231 Encounter for screening mammogram for malignant neoplasm of breast: Secondary | ICD-10-CM

## 2010-07-18 ENCOUNTER — Other Ambulatory Visit: Payer: Self-pay | Admitting: Family Medicine

## 2010-07-18 DIAGNOSIS — M5382 Other specified dorsopathies, cervical region: Secondary | ICD-10-CM

## 2010-07-18 NOTE — Telephone Encounter (Signed)
Pt req refill of cyclobenzaprine (FLEXERIL) 10 MG tablet and citalopram (CELEXA) 40 MG tablet , Please call pt when ready for pick up.

## 2010-07-19 MED ORDER — CYCLOBENZAPRINE HCL 10 MG PO TABS: ORAL_TABLET | ORAL | Status: DC

## 2010-07-19 MED ORDER — CITALOPRAM HYDROBROMIDE 40 MG PO TABS: 40.0000 mg | ORAL_TABLET | Freq: Every day | ORAL | Status: DC

## 2010-07-19 NOTE — Telephone Encounter (Signed)
Okay to refill it until next physical exam

## 2010-07-19 NOTE — Telephone Encounter (Signed)
rx ready for pick up and patient  Is aware

## 2010-08-09 ENCOUNTER — Telehealth: Payer: Self-pay | Admitting: Family Medicine

## 2010-08-09 DIAGNOSIS — F988 Other specified behavioral and emotional disorders with onset usually occurring in childhood and adolescence: Secondary | ICD-10-CM

## 2010-08-09 MED ORDER — AMPHETAMINE-DEXTROAMPHETAMINE 20 MG PO TABS: 20.0000 mg | ORAL_TABLET | Freq: Two times a day (BID) | ORAL | Status: DC

## 2010-08-09 NOTE — Telephone Encounter (Signed)
rx ready for pick up.  Tried to call but message box is full.

## 2010-08-09 NOTE — Telephone Encounter (Signed)
Pt. Requesting refill onamphetamine-dextroamphetamine (ADDERALL) 20 MG tablet

## 2010-10-11 ENCOUNTER — Encounter: Payer: BLUE CROSS/BLUE SHIELD | Admitting: Family Medicine

## 2010-10-12 ENCOUNTER — Telehealth: Payer: Self-pay | Admitting: Family Medicine

## 2010-10-12 NOTE — Telephone Encounter (Signed)
We are unable to refill your medication,........... In the future only turn in one prescription at a time

## 2010-10-12 NOTE — Telephone Encounter (Signed)
Pt received a 3 months worth of scripts of amphetamine-dextroamphetamine (ADDERALL) 20 MG tablet on 08/09/10 pt dropped script off at pharmacy and they kept all 3. Pt went back to get refill and pharmacy said they had no refills for pt. Pt is requesting remaining 2 months of scripts. Please contact pt.

## 2010-10-12 NOTE — Telephone Encounter (Signed)
Spoke with pharmacy and rx is on hold so they will fill it today

## 2010-11-16 ENCOUNTER — Other Ambulatory Visit (INDEPENDENT_AMBULATORY_CARE_PROVIDER_SITE_OTHER): Payer: BLUE CROSS/BLUE SHIELD

## 2010-11-16 DIAGNOSIS — M5382 Other specified dorsopathies, cervical region: Secondary | ICD-10-CM

## 2010-11-16 DIAGNOSIS — F988 Other specified behavioral and emotional disorders with onset usually occurring in childhood and adolescence: Secondary | ICD-10-CM

## 2010-11-16 LAB — CBC WITH DIFFERENTIAL/PLATELET
Basophils Absolute: 0 10*3/uL (ref 0.0–0.1)
Eosinophils Relative: 0.6 % (ref 0.0–5.0)
Hemoglobin: 12.6 g/dL (ref 12.0–15.0)
Lymphs Abs: 1 10*3/uL (ref 0.7–4.0)
Neutrophils Relative %: 65.2 % (ref 43.0–77.0)
Platelets: 257 10*3/uL (ref 150.0–400.0)
RBC: 4.44 Mil/uL (ref 3.87–5.11)
RDW: 15.6 % — ABNORMAL HIGH (ref 11.5–14.6)
WBC: 4.1 10*3/uL — ABNORMAL LOW (ref 4.5–10.5)

## 2010-11-16 LAB — LIPID PANEL
Cholesterol: 189 mg/dL (ref 0–200)
LDL Cholesterol: 111 mg/dL — ABNORMAL HIGH (ref 0–99)

## 2010-11-16 LAB — POCT URINALYSIS DIPSTICK
Nitrite, UA: NEGATIVE
Urobilinogen, UA: 2

## 2010-11-16 LAB — BASIC METABOLIC PANEL
Calcium: 8.7 mg/dL (ref 8.4–10.5)
Chloride: 103 mEq/L (ref 96–112)
GFR: 100.57 mL/min (ref >60.00)
Glucose, Bld: 80 mg/dL (ref 70–99)

## 2010-11-16 LAB — HEPATIC FUNCTION PANEL: ALT: 11 U/L (ref 0–35)

## 2010-11-28 ENCOUNTER — Ambulatory Visit (INDEPENDENT_AMBULATORY_CARE_PROVIDER_SITE_OTHER): Payer: BLUE CROSS/BLUE SHIELD | Admitting: Family Medicine

## 2010-11-28 ENCOUNTER — Encounter: Payer: Self-pay | Admitting: Family Medicine

## 2010-11-28 DIAGNOSIS — F988 Other specified behavioral and emotional disorders with onset usually occurring in childhood and adolescence: Secondary | ICD-10-CM

## 2010-11-28 DIAGNOSIS — D259 Leiomyoma of uterus, unspecified: Secondary | ICD-10-CM

## 2010-11-28 MED ORDER — AMPHETAMINE-DEXTROAMPHETAMINE 20 MG PO TABS: 20.0000 mg | ORAL_TABLET | Freq: Two times a day (BID) | ORAL | Status: DC

## 2010-11-28 MED ORDER — CITALOPRAM HYDROBROMIDE 40 MG PO TABS: 40.0000 mg | ORAL_TABLET | Freq: Every day | ORAL | Status: DC

## 2010-11-28 NOTE — Progress Notes (Signed)
  Subjective:    Patient ID: Misty Snyder, female    DOB: 1960/09/20, 50 y.o.   MRN: [id number]  HPI Misty Snyder is a 50 year old female, who comes in today for evaluation of ADD,  She's had a long standing history of adult ADD for which he takes Adderall 20 mg b.i.d.  She also has a history of mild depression, for which she takes Celexa 40 mg nightly.  She then goes on to tell me about her GYN history.  She has a history of an enlarged fibroid uterus and [facility]  She is not happy with family planning and wants a referral to a GYN.  Referred to Dr. [name].  She gets routine eye care, dental care, does not do BSE monthly, but gets an annual mammogram.  Recent colonoscopy for GI bleeding showed internal hemorrhoids.  No cancer.  Tetanus 2011 seasonal flu shot 2012.   Review of Systems  Constitutional: Negative.   HENT: Negative.   Eyes: Negative.   Respiratory: Negative.   Cardiovascular: Negative.   Gastrointestinal: Negative.   Genitourinary: Negative.   Musculoskeletal: Negative.   Skin: Positive for rash.  Neurological: Negative.   Hematological: Negative.   Psychiatric/Behavioral: Negative.        Objective:   Physical Exam  Constitutional: She appears well-developed and well-nourished.  HENT:  Head: Normocephalic and atraumatic.  Right Ear: External ear normal.  Left Ear: External ear normal.  Nose: Nose normal.  Mouth/Throat: Oropharynx is clear and moist.  Eyes: EOM are normal. Pupils are equal, round, and reactive to light.  Neck: Normal range of motion. Neck supple. No thyromegaly present.  Cardiovascular: Normal rate, regular rhythm, normal heart sounds and intact distal pulses.  Exam reveals no gallop and no friction rub.   No murmur heard. Pulmonary/Chest: Effort normal and breath sounds normal.  Abdominal: Soft. Bowel sounds are normal. She exhibits no distension and no mass. There is no tenderness. There is no  rebound.       Her uterus is palpable through the abdomen.  It's just at the umbilicus.  This has been a chronic problem  Musculoskeletal: Normal range of motion.  Lymphadenopathy:    She has no cervical adenopathy.  Neurological: She is alert. She has normal reflexes. No cranial nerve deficit. She exhibits normal muscle tone. Coordination normal.  Skin: Skin is warm and dry.  Psychiatric: She has a normal mood and affect. Her behavior is normal. Judgment and thought content normal.          Assessment & Plan:  Healthy female.  History of adult ADD continue Adderall 20 mg b.i.d.  History of mild depression.  Continue Celexa 40 mg nightly  History of enlarged uterus dysfunction uterine bleeding perimenopausal symptoms.  Refer to Dr. [name].  GYN

## 2010-11-28 NOTE — Patient Instructions (Signed)
I would recommend Dr. Ilda Mori that with Misty Snyder in GYN for consultation regarding your GYN problems.  Continued the Adderall 20 mg twice daily.  Remember call two weeks from being out of his third prescription for refills.  Returned November 2013 for your annual checkup.  Continue the Celexa 40 mg a day.  At that time.

## 2011-04-10 ENCOUNTER — Telehealth: Payer: Self-pay | Admitting: Family Medicine

## 2011-04-10 DIAGNOSIS — F988 Other specified behavioral and emotional disorders with onset usually occurring in childhood and adolescence: Secondary | ICD-10-CM

## 2011-04-10 MED ORDER — AMPHETAMINE-DEXTROAMPHETAMINE 20 MG PO TABS: 20.0000 mg | ORAL_TABLET | Freq: Two times a day (BID) | ORAL | Status: DC

## 2011-04-10 NOTE — Telephone Encounter (Signed)
Patient called stating that she need a refill on her adderall. Please assist.

## 2011-04-10 NOTE — Telephone Encounter (Signed)
Rx ready for pick up and patient is aware 

## 2011-05-06 ENCOUNTER — Other Ambulatory Visit: Payer: Self-pay | Admitting: Family Medicine

## 2011-05-25 ENCOUNTER — Telehealth: Payer: Self-pay | Admitting: Family Medicine

## 2011-05-25 DIAGNOSIS — F988 Other specified behavioral and emotional disorders with onset usually occurring in childhood and adolescence: Secondary | ICD-10-CM

## 2011-05-25 NOTE — Telephone Encounter (Signed)
Patient is currently taking 20 mg is this okay to increase?

## 2011-05-25 NOTE — Telephone Encounter (Signed)
Pt would like to increase dose of amphetamine-dextroamphetamine (ADDERALL) 20 MG tablet Please contact

## 2011-05-25 NOTE — Telephone Encounter (Signed)
Fleet Contras please call okay to increase dose to 20 mg daily prescriptions x3 months as usual

## 2011-05-25 NOTE — Telephone Encounter (Signed)
Increased to 25 mg daily

## 2011-05-26 MED ORDER — AMPHETAMINE-DEXTROAMPHETAMINE 30 MG PO TABS: 30.0000 mg | ORAL_TABLET | Freq: Every evening | ORAL | Status: DC

## 2011-05-26 MED ORDER — AMPHETAMINE-DEXTROAMPHETAMINE 20 MG PO TABS: 20.0000 mg | ORAL_TABLET | Freq: Every morning | ORAL | Status: DC

## 2011-05-26 NOTE — Telephone Encounter (Signed)
Rx ready for pick up and patient is aware 

## 2011-05-26 NOTE — Telephone Encounter (Signed)
Adderall 30 mg in the AM and Adderall 20 mg in the PM for 30 days per Dr Tawanna Cooler.  Patient is aware.

## 2011-06-13 ENCOUNTER — Other Ambulatory Visit: Payer: Self-pay | Admitting: Family Medicine

## 2011-06-13 ENCOUNTER — Other Ambulatory Visit: Payer: Self-pay | Admitting: *Deleted

## 2011-06-13 DIAGNOSIS — Z1231 Encounter for screening mammogram for malignant neoplasm of breast: Secondary | ICD-10-CM

## 2011-06-13 MED ORDER — AMPHETAMINE-DEXTROAMPHETAMINE 30 MG PO TABS: 30.0000 mg | ORAL_TABLET | Freq: Every day | ORAL | Status: DC

## 2011-06-13 MED ORDER — AMPHETAMINE-DEXTROAMPHETAMINE 30 MG PO TABS: 30.0000 mg | ORAL_TABLET | Freq: Every evening | ORAL | Status: DC

## 2011-07-05 ENCOUNTER — Ambulatory Visit (HOSPITAL_COMMUNITY)
Admission: RE | Admit: 2011-07-05 | Discharge: 2011-07-05 | Disposition: A | Discharge status: Home / Self Care | Payer: BC Managed Care – PPO | Source: Ambulatory Visit | Attending: Family Medicine | Admitting: Family Medicine

## 2011-07-05 DIAGNOSIS — Z1231 Encounter for screening mammogram for malignant neoplasm of breast: Secondary | ICD-10-CM | POA: Insufficient documentation

## 2011-08-03 ENCOUNTER — Other Ambulatory Visit: Payer: Self-pay | Admitting: Family Medicine

## 2011-08-03 NOTE — Telephone Encounter (Signed)
Pt is requesting flexeril call into [address]. This will be a new rx to [facility]. Pt will stay with [facility] [address] is closer pt staying with grandmother temporarily

## 2011-08-03 NOTE — Telephone Encounter (Signed)
Fleet Contras please call Alexandra we are switching to Valium 2 mg dispense 30 tabs directions 1 each bedtime when necessary refills x2

## 2011-08-04 MED ORDER — DIAZEPAM 2 MG PO TABS: 2.0000 mg | ORAL_TABLET | Freq: Every evening | ORAL | Status: AC | PRN

## 2011-08-04 NOTE — Telephone Encounter (Signed)
New rx sent in and patient is aware

## 2011-08-18 ENCOUNTER — Telehealth: Payer: Self-pay | Admitting: Family Medicine

## 2011-08-18 DIAGNOSIS — F988 Other specified behavioral and emotional disorders with onset usually occurring in childhood and adolescence: Secondary | ICD-10-CM

## 2011-08-18 DIAGNOSIS — F329 Major depressive disorder, single episode, unspecified: Secondary | ICD-10-CM

## 2011-08-18 MED ORDER — AMPHETAMINE-DEXTROAMPHETAMINE 30 MG PO TABS: 30.0000 mg | ORAL_TABLET | Freq: Every day | ORAL | Status: DC

## 2011-08-18 MED ORDER — CITALOPRAM HYDROBROMIDE 40 MG PO TABS: 40.0000 mg | ORAL_TABLET | Freq: Every day | ORAL | Status: DC

## 2011-08-18 MED ORDER — AMPHETAMINE-DEXTROAMPHETAMINE 30 MG PO TABS: 30.0000 mg | ORAL_TABLET | Freq: Every evening | ORAL | Status: DC

## 2011-08-18 NOTE — Telephone Encounter (Signed)
Rx ready for pick up. 

## 2011-08-18 NOTE — Telephone Encounter (Signed)
Patient called stating that she has misplaced or threw away her adderall and celexa. Please advise.

## 2011-12-12 ENCOUNTER — Other Ambulatory Visit: Payer: BLUE CROSS/BLUE SHIELD

## 2011-12-13 ENCOUNTER — Other Ambulatory Visit (INDEPENDENT_AMBULATORY_CARE_PROVIDER_SITE_OTHER): Payer: BC Managed Care – PPO

## 2011-12-13 DIAGNOSIS — Z0000 Encounter for general adult medical examination without abnormal findings: Secondary | ICD-10-CM

## 2011-12-13 LAB — CBC WITH DIFFERENTIAL/PLATELET
Basophils Relative: 0.3 % (ref 0.0–3.0)
Eosinophils Absolute: 0 10*3/uL (ref 0.0–0.7)
Eosinophils Relative: 0.7 % (ref 0.0–5.0)
Lymphocytes Relative: 24.1 % (ref 12.0–46.0)
Neutrophils Relative %: 66 % (ref 43.0–77.0)
Platelets: 255 10*3/uL (ref 150.0–400.0)
RBC: 4.49 Mil/uL (ref 3.87–5.11)
WBC: 4.1 10*3/uL — ABNORMAL LOW (ref 4.5–10.5)

## 2011-12-13 LAB — HEPATIC FUNCTION PANEL
ALT: 30 U/L (ref 0–35)
Alkaline Phosphatase: 59 U/L (ref 39–117)
Bilirubin, Direct: 0.1 mg/dL (ref 0.0–0.3)
Total Bilirubin: 0.6 mg/dL (ref 0.3–1.2)
Total Protein: 7.8 g/dL (ref 6.0–8.3)

## 2011-12-13 LAB — LIPID PANEL
Cholesterol: 214 mg/dL — ABNORMAL HIGH (ref 0–200)
HDL: 61.8 mg/dL (ref >39.00)
Total CHOL/HDL Ratio: 3
VLDL: 10.8 mg/dL (ref 0.0–40.0)

## 2011-12-13 LAB — POCT URINALYSIS DIPSTICK
Bilirubin, UA: NEGATIVE
Ketones, UA: NEGATIVE
Leukocytes, UA: NEGATIVE
Protein, UA: NEGATIVE
Spec Grav, UA: 1.015

## 2011-12-13 LAB — BASIC METABOLIC PANEL
BUN: 11 mg/dL (ref 6–23)
Calcium: 9 mg/dL (ref 8.4–10.5)
Creatinine, Ser: 0.6 mg/dL (ref 0.4–1.2)

## 2011-12-19 ENCOUNTER — Encounter: Payer: BLUE CROSS/BLUE SHIELD | Admitting: Family Medicine

## 2011-12-27 ENCOUNTER — Other Ambulatory Visit (HOSPITAL_COMMUNITY)
Admission: RE | Admit: 2011-12-27 | Discharge: 2011-12-27 | Disposition: A | Discharge status: Home / Self Care | Payer: BC Managed Care – PPO | Source: Ambulatory Visit | Attending: Family Medicine | Admitting: Family Medicine

## 2011-12-27 ENCOUNTER — Encounter: Payer: Self-pay | Admitting: Family Medicine

## 2011-12-27 ENCOUNTER — Ambulatory Visit (INDEPENDENT_AMBULATORY_CARE_PROVIDER_SITE_OTHER): Payer: BC Managed Care – PPO | Admitting: Family Medicine

## 2011-12-27 VITALS — BP 110/70 | Temp 98.3°F | Ht 65.0 in | Wt 170.0 lb

## 2011-12-27 DIAGNOSIS — Z0000 Encounter for general adult medical examination without abnormal findings: Secondary | ICD-10-CM

## 2011-12-27 DIAGNOSIS — Z01419 Encounter for gynecological examination (general) (routine) without abnormal findings: Secondary | ICD-10-CM | POA: Insufficient documentation

## 2011-12-27 DIAGNOSIS — F988 Other specified behavioral and emotional disorders with onset usually occurring in childhood and adolescence: Secondary | ICD-10-CM

## 2011-12-27 DIAGNOSIS — D259 Leiomyoma of uterus, unspecified: Secondary | ICD-10-CM

## 2011-12-27 DIAGNOSIS — F329 Major depressive disorder, single episode, unspecified: Secondary | ICD-10-CM

## 2011-12-27 MED ORDER — AMPHETAMINE-DEXTROAMPHETAMINE 30 MG PO TABS: 30.0000 mg | ORAL_TABLET | Freq: Every day | ORAL | Status: DC

## 2011-12-27 MED ORDER — AMITRIPTYLINE HCL 25 MG PO TABS: 25.0000 mg | ORAL_TABLET | Freq: Every day | ORAL | Status: DC

## 2011-12-27 MED ORDER — AMPHETAMINE-DEXTROAMPHETAMINE 30 MG PO TABS: 30.0000 mg | ORAL_TABLET | Freq: Every evening | ORAL | Status: DC

## 2011-12-27 NOTE — Progress Notes (Signed)
  Subjective:    Patient ID: Misty Snyder, female    DOB: 11-17-1960, 51 y.o.   MRN: [id number]  HPI Misty Snyder is a 51 year old female nonsmoker who comes in today for physical examination because she hasn't felt well in 6 months  Her last period was in March 2013. Since that time she's had decreased energy hot flashes insomnia some mood changes. She's working full-time and going to school and the Designer, industrial/product at [employer].  She takes 30 mg of Adderall daily for ADD.  She had no GYN problems prior to menopause except for uterine fibroids.  She gets routine eye care, dental care, and you mammography, colonoscopy 2005 normal. She does not do BSE monthly.   Review of Systems  Constitutional: Negative.   HENT: Negative.   Eyes: Negative.   Respiratory: Negative.   Cardiovascular: Negative.   Gastrointestinal: Negative.   Genitourinary: Negative.   Musculoskeletal: Negative.   Neurological: Negative.   Hematological: Negative.   Psychiatric/Behavioral: Negative.        Objective:   Physical Exam  Constitutional: She appears well-developed and well-nourished.  HENT:  Head: Normocephalic and atraumatic.  Right Ear: External ear normal.  Left Ear: External ear normal.  Nose: Nose normal.  Mouth/Throat: Oropharynx is clear and moist.  Eyes: EOM are normal. Pupils are equal, round, and reactive to light.  Neck: Normal range of motion. Neck supple. No thyromegaly present.  Cardiovascular: Normal rate, regular rhythm, normal heart sounds and intact distal pulses.  Exam reveals no gallop and no friction rub.   No murmur heard. Pulmonary/Chest: Effort normal and breath sounds normal.  Abdominal: Soft. Bowel sounds are normal. She exhibits no distension and no mass. There is no tenderness. There is no rebound.  Genitourinary: Vagina normal. Guaiac negative stool. No vaginal discharge found.       Enlarged uterus secondary to uterine fibroids asymptomatic   Musculoskeletal: Normal range of motion.  Lymphadenopathy:    She has no cervical adenopathy.  Neurological: She is alert. She has normal reflexes. No cranial nerve deficit. She exhibits normal muscle tone. Coordination normal.  Skin: Skin is warm and dry.  Psychiatric: She has a normal mood and affect. Her behavior is normal. Judgment and thought content normal.          Assessment & Plan:  Healthy female  Uterine fibroids asymptomatic  Postmenopausal with symptoms of decreased energy hot flashes and insomnia discuss options,,, patient elects to try some low-dose Elavil for the sleep dysfunction and not take any hormone replacement therapy at this juncture  Adult ADD continue Adderall 30 mg daily.

## 2011-12-27 NOTE — Patient Instructions (Signed)
Begin the Elavil 25 mg,,,,,,,,,,, one tablet 30 minutes before bedtime  Skin program as outlined  Do a thorough breast exam monthly  Followup in 3 months sooner if any problems

## 2012-01-02 ENCOUNTER — Other Ambulatory Visit: Payer: BLUE CROSS/BLUE SHIELD

## 2012-01-09 ENCOUNTER — Encounter: Payer: BLUE CROSS/BLUE SHIELD | Admitting: Family Medicine

## 2012-03-28 ENCOUNTER — Ambulatory Visit: Payer: BC Managed Care – PPO | Admitting: Family Medicine

## 2012-04-30 ENCOUNTER — Telehealth: Payer: Self-pay | Admitting: Family Medicine

## 2012-04-30 DIAGNOSIS — F988 Other specified behavioral and emotional disorders with onset usually occurring in childhood and adolescence: Secondary | ICD-10-CM

## 2012-04-30 MED ORDER — AMPHETAMINE-DEXTROAMPHETAMINE 30 MG PO TABS: 30.0000 mg | ORAL_TABLET | Freq: Every day | ORAL | Status: DC

## 2012-04-30 MED ORDER — AMPHETAMINE-DEXTROAMPHETAMINE 30 MG PO TABS: 30.0000 mg | ORAL_TABLET | Freq: Every evening | ORAL | Status: DC

## 2012-04-30 NOTE — Telephone Encounter (Signed)
Patient called stating that she need a refill of her adderall 30 mg 1poqd. Please assist.  °

## 2012-04-30 NOTE — Telephone Encounter (Signed)
Rx ready for pick up and patient is aware 

## 2012-05-07 ENCOUNTER — Ambulatory Visit (INDEPENDENT_AMBULATORY_CARE_PROVIDER_SITE_OTHER): Payer: BC Managed Care – PPO | Admitting: Family Medicine

## 2012-05-07 ENCOUNTER — Encounter: Payer: Self-pay | Admitting: Family Medicine

## 2012-05-07 VITALS — BP 110/80 | HR 88 | Temp 98.6°F | Resp 16 | Ht 62.0 in | Wt 176.0 lb

## 2012-05-07 DIAGNOSIS — G4701 Insomnia due to medical condition: Secondary | ICD-10-CM

## 2012-05-07 DIAGNOSIS — N951 Menopausal and female climacteric states: Secondary | ICD-10-CM

## 2012-05-07 MED ORDER — AMITRIPTYLINE HCL 50 MG PO TABS: 50.0000 mg | ORAL_TABLET | Freq: Every day | ORAL | Status: DC

## 2012-05-07 NOTE — Patient Instructions (Signed)
Increase the Elavil to 50 mg  If in one month your still not sleeping well then increase it to 75 mg  If that dose does not work or you begin have side effects from the medication and call and leave a voicemail for [place] and we will discuss the next option

## 2012-05-07 NOTE — Progress Notes (Signed)
  Subjective:    Patient ID: Misty Snyder, female    DOB: March 09, 1960, 52 y.o.   MRN: [id number]  HPI Misty Snyder is a 52 year old female nonsmoker who comes in today for followup and insomnia  We saw her a couple months ago she has a lot of menopausal symptoms dry skin hot flashes and sleep dysfunction. We started her on Elavil 25 mg each bedtime. She states is helping a little bit but not all that much. She's had no major side effects from the medication.     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

## 2012-06-05 ENCOUNTER — Other Ambulatory Visit: Payer: Self-pay | Admitting: Family Medicine

## 2012-06-05 DIAGNOSIS — Z1231 Encounter for screening mammogram for malignant neoplasm of breast: Secondary | ICD-10-CM

## 2012-06-20 ENCOUNTER — Telehealth: Payer: Self-pay | Admitting: Family Medicine

## 2012-06-20 DIAGNOSIS — G4701 Insomnia due to medical condition: Secondary | ICD-10-CM

## 2012-06-20 MED ORDER — AMITRIPTYLINE HCL 50 MG PO TABS: 50.0000 mg | ORAL_TABLET | Freq: Every day | ORAL | Status: DC

## 2012-06-20 NOTE — Telephone Encounter (Signed)
Pt lost rx for amitriptyline 50  Mg. Please call [facility]

## 2012-07-12 ENCOUNTER — Ambulatory Visit (HOSPITAL_COMMUNITY)
Admission: RE | Admit: 2012-07-12 | Discharge: 2012-07-12 | Disposition: A | Discharge status: Home / Self Care | Payer: BC Managed Care – PPO | Source: Ambulatory Visit | Attending: Family Medicine | Admitting: Family Medicine

## 2012-07-12 DIAGNOSIS — Z1231 Encounter for screening mammogram for malignant neoplasm of breast: Secondary | ICD-10-CM

## 2012-09-27 ENCOUNTER — Other Ambulatory Visit: Payer: Self-pay | Admitting: Family Medicine

## 2012-09-30 ENCOUNTER — Telehealth: Payer: Self-pay | Admitting: Family Medicine

## 2012-09-30 NOTE — Telephone Encounter (Signed)
Pt called to let MD know her sleep mediation wasn't working. When RN called her number back/vm has not been set up yet.

## 2012-10-01 NOTE — Telephone Encounter (Signed)
Per Dr Tawanna Cooler, patient should go see Dr Emerson Monte.  Unable to leave message on cell or home number.

## 2012-10-22 ENCOUNTER — Telehealth: Payer: Self-pay | Admitting: Family Medicine

## 2012-10-22 DIAGNOSIS — F988 Other specified behavioral and emotional disorders with onset usually occurring in childhood and adolescence: Secondary | ICD-10-CM

## 2012-10-22 MED ORDER — AMPHETAMINE-DEXTROAMPHETAMINE 30 MG PO TABS: 30.0000 mg | ORAL_TABLET | Freq: Every day | ORAL | Status: DC

## 2012-10-22 MED ORDER — AMPHETAMINE-DEXTROAMPHETAMINE 30 MG PO TABS: 30.0000 mg | ORAL_TABLET | Freq: Every evening | ORAL | Status: DC

## 2012-10-22 NOTE — Telephone Encounter (Signed)
Pt is calling to request a 3 month supply of her amphetamine-dextroamphetamine (ADDERALL) 30 MG tablet. Please assist.

## 2012-10-22 NOTE — Telephone Encounter (Signed)
Rx ready for pick up.  Unable to leave message because voice mail is not set up at this time.

## 2012-10-29 ENCOUNTER — Ambulatory Visit: Payer: BC Managed Care – PPO | Admitting: Family Medicine

## 2013-06-30 ENCOUNTER — Other Ambulatory Visit: Payer: Self-pay | Admitting: Family Medicine

## 2013-06-30 DIAGNOSIS — Z1231 Encounter for screening mammogram for malignant neoplasm of breast: Secondary | ICD-10-CM

## 2013-07-14 ENCOUNTER — Ambulatory Visit (HOSPITAL_COMMUNITY)
Admission: RE | Admit: 2013-07-14 | Discharge: 2013-07-14 | Disposition: A | Discharge status: Home / Self Care | Payer: BC Managed Care – PPO | Source: Ambulatory Visit | Attending: Family Medicine | Admitting: Family Medicine

## 2013-07-14 DIAGNOSIS — Z1231 Encounter for screening mammogram for malignant neoplasm of breast: Secondary | ICD-10-CM | POA: Insufficient documentation

## 2014-01-16 HISTORY — PX: MASTECTOMY: SHX3

## 2014-02-09 ENCOUNTER — Other Ambulatory Visit: Payer: Self-pay | Admitting: *Deleted

## 2014-02-09 DIAGNOSIS — Z0000 Encounter for general adult medical examination without abnormal findings: Secondary | ICD-10-CM

## 2014-02-10 ENCOUNTER — Other Ambulatory Visit (INDEPENDENT_AMBULATORY_CARE_PROVIDER_SITE_OTHER): Payer: BC Managed Care – PPO

## 2014-02-10 DIAGNOSIS — Z0000 Encounter for general adult medical examination without abnormal findings: Secondary | ICD-10-CM

## 2014-02-10 LAB — CBC WITH DIFFERENTIAL/PLATELET
BASOS ABS: 0 10*3/uL (ref 0.0–0.1)
BASOS PCT: 0.5 % (ref 0.0–3.0)
Eosinophils Absolute: 0 10*3/uL (ref 0.0–0.7)
Eosinophils Relative: 1 % (ref 0.0–5.0)
HCT: 40.4 % (ref 36.0–46.0)
HEMOGLOBIN: 13.4 g/dL (ref 12.0–15.0)
Lymphocytes Relative: 25.7 % (ref 12.0–46.0)
Lymphs Abs: 1.1 10*3/uL (ref 0.7–4.0)
MCHC: 33.1 g/dL (ref 30.0–36.0)
MCV: 82.5 fl (ref 78.0–100.0)
MONOS PCT: 9.9 % (ref 3.0–12.0)
Monocytes Absolute: 0.4 10*3/uL (ref 0.1–1.0)
Neutro Abs: 2.7 10*3/uL (ref 1.4–7.7)
Neutrophils Relative %: 62.9 % (ref 43.0–77.0)
PLATELETS: 266 10*3/uL (ref 150.0–400.0)
RBC: 4.9 Mil/uL (ref 3.87–5.11)
RDW: 16.1 % — AB (ref 11.5–15.5)
WBC: 4.3 10*3/uL (ref 4.0–10.5)

## 2014-02-10 LAB — TSH: TSH: 1.73 u[IU]/mL (ref 0.35–4.50)

## 2014-02-11 LAB — COMPREHENSIVE METABOLIC PANEL
ALBUMIN: 4.1 g/dL (ref 3.5–5.2)
ALK PHOS: 66 U/L (ref 39–117)
ALT: 13 U/L (ref 0–35)
AST: 16 U/L (ref 0–37)
BUN: 10 mg/dL (ref 6–23)
CHLORIDE: 104 meq/L (ref 96–112)
CO2: 29 meq/L (ref 19–32)
Calcium: 9.3 mg/dL (ref 8.4–10.5)
Creatinine, Ser: 0.7 mg/dL (ref 0.40–1.20)
GFR: 112.5 mL/min (ref >60.00)
GLUCOSE: 96 mg/dL (ref 70–99)
Potassium: 4 mEq/L (ref 3.5–5.1)
Sodium: 140 mEq/L (ref 135–145)
TOTAL PROTEIN: 7.9 g/dL (ref 6.0–8.3)
Total Bilirubin: 0.3 mg/dL (ref 0.2–1.2)

## 2014-02-11 LAB — POCT URINALYSIS DIPSTICK
Bilirubin, UA: NEGATIVE
Glucose, UA: NEGATIVE
KETONES UA: NEGATIVE
NITRITE UA: NEGATIVE
PROTEIN UA: NEGATIVE
Spec Grav, UA: 1.02
Urobilinogen, UA: 0.2
pH, UA: 6

## 2014-02-11 LAB — LIPID PANEL
Cholesterol: 191 mg/dL (ref 0–200)
HDL: 50.2 mg/dL (ref >39.00)
LDL Cholesterol: 117 mg/dL — ABNORMAL HIGH (ref 0–99)
NONHDL: 140.8
TRIGLYCERIDES: 118 mg/dL (ref 0.0–149.0)
Total CHOL/HDL Ratio: 4
VLDL: 23.6 mg/dL (ref 0.0–40.0)

## 2014-02-16 ENCOUNTER — Encounter: Payer: Self-pay | Admitting: Family Medicine

## 2014-02-16 ENCOUNTER — Ambulatory Visit (INDEPENDENT_AMBULATORY_CARE_PROVIDER_SITE_OTHER): Payer: BC Managed Care – PPO | Admitting: Family Medicine

## 2014-02-16 VITALS — BP 124/74 | HR 104 | Temp 98.0°F | Ht 63.0 in | Wt 176.2 lb

## 2014-02-16 DIAGNOSIS — F988 Other specified behavioral and emotional disorders with onset usually occurring in childhood and adolescence: Secondary | ICD-10-CM

## 2014-02-16 DIAGNOSIS — F32A Depression, unspecified: Secondary | ICD-10-CM

## 2014-02-16 DIAGNOSIS — F909 Attention-deficit hyperactivity disorder, unspecified type: Secondary | ICD-10-CM

## 2014-02-16 DIAGNOSIS — Z23 Encounter for immunization: Secondary | ICD-10-CM

## 2014-02-16 DIAGNOSIS — G4701 Insomnia due to medical condition: Secondary | ICD-10-CM

## 2014-02-16 DIAGNOSIS — F329 Major depressive disorder, single episode, unspecified: Secondary | ICD-10-CM

## 2014-02-16 MED ORDER — AMPHETAMINE-DEXTROAMPHETAMINE 30 MG PO TABS: 30.0000 mg | ORAL_TABLET | Freq: Every evening | ORAL | Status: DC

## 2014-02-16 MED ORDER — CITALOPRAM HYDROBROMIDE 40 MG PO TABS: ORAL_TABLET | ORAL | Status: DC

## 2014-02-16 MED ORDER — AMPHETAMINE-DEXTROAMPHETAMINE 30 MG PO TABS: 30.0000 mg | ORAL_TABLET | Freq: Every day | ORAL | Status: DC

## 2014-02-16 NOTE — Patient Instructions (Signed)
Call Dr. Tomasita Crumble [facility] [phone] and asked to be seen by either her one of her associates for evaluation of the sleep dysfunction  Continue current medications  Call 2 weeks from being out of your third prescription for refills  Avoid caffeine  Walk 30 minutes daily

## 2014-02-16 NOTE — Progress Notes (Signed)
   Subjective:    Patient ID: Misty Snyder, female    DOB: [birth year], 54 y.o.   MRN: [id number]  HPI Misty Snyder is a 54 year old female who comes in today for general physical examination because of a history of adult ADD and depression  She takes Adderall 30 mg daily and doing well. She also takes Celexa 40 mg daily at bedtime for history of mild depression  Last year we saw her and advised to see a specialist for evaluation the insomnia. She never went. She still has insomnia.  She gets routine eye care, dental care, BSE monthly no, regular mammography S, recent colonoscopy normal  Vaccinations up-to-date  LMP 2 years ago Pap smear 2 years ago normal no history of any cervical disorder she declines a yearly Pap smear.   Review of Systems  Constitutional: Negative.   HENT: Negative.   Eyes: Negative.   Respiratory: Negative.   Cardiovascular: Negative.   Gastrointestinal: Negative.   Endocrine: Negative.   Genitourinary: Negative.   Musculoskeletal: Negative.   Skin: Negative.   Allergic/Immunologic: Negative.   Neurological: Negative.   Hematological: Negative.   Psychiatric/Behavioral: Negative.        Objective:   Physical Exam  Constitutional: She appears well-developed and well-nourished.  HENT:  Head: Normocephalic and atraumatic.  Right Ear: External ear normal.  Left Ear: External ear normal.  Nose: Nose normal.  Mouth/Throat: Oropharynx is clear and moist.  Eyes: EOM are normal. Pupils are equal, round, and reactive to light.  Neck: Normal range of motion. Neck supple. No JVD present. No tracheal deviation present. No thyromegaly present.  Cardiovascular: Normal rate, regular rhythm, normal heart sounds and intact distal pulses.  Exam reveals no gallop and no friction rub.   No murmur heard. Pulmonary/Chest: Effort normal and breath sounds normal. No stridor. No respiratory distress. She has no wheezes. She has no rales. She exhibits no tenderness.    Abdominal: Soft. Bowel sounds are normal. She exhibits no distension and no mass. There is no tenderness. There is no rebound and no guarding.  Genitourinary:  Bilateral breast exam normal  Musculoskeletal: Normal range of motion.  Lymphadenopathy:    She has no cervical adenopathy.  Neurological: She is alert. She has normal reflexes. No cranial nerve deficit. She exhibits normal muscle tone. Coordination normal.  Skin: Skin is warm and dry. No rash noted. No erythema. No pallor.  Psychiatric: She has a normal mood and affect. Her behavior is normal. Judgment and thought content normal.  Nursing note and vitals reviewed.         Assessment & Plan:  Healthy female  Adult ADD,,,,,,,, continue Adderall  Insomnia,,,,,,,, again referred to Dr. Caprice Beaver in group  History of mild depression,,,,,,,,,, continue Celexa

## 2014-02-16 NOTE — Progress Notes (Signed)
Pre visit review using our clinic review tool, if applicable. No additional management support is needed unless otherwise documented below in the visit note. 

## 2014-02-16 NOTE — Addendum Note (Signed)
Addended by: Townsend Roger D on: 02/16/2014 05:27 PM   Modules accepted: Orders

## 2014-04-21 ENCOUNTER — Other Ambulatory Visit: Payer: Self-pay | Admitting: Family Medicine

## 2014-04-21 DIAGNOSIS — Z1231 Encounter for screening mammogram for malignant neoplasm of breast: Secondary | ICD-10-CM

## 2014-05-04 ENCOUNTER — Ambulatory Visit: Payer: BC Managed Care – PPO | Admitting: Family Medicine

## 2014-05-05 ENCOUNTER — Encounter: Payer: Self-pay | Admitting: Family Medicine

## 2014-05-05 ENCOUNTER — Ambulatory Visit (INDEPENDENT_AMBULATORY_CARE_PROVIDER_SITE_OTHER): Payer: BC Managed Care – PPO | Admitting: Family Medicine

## 2014-05-05 VITALS — BP 112/72 | Temp 97.9°F | Wt 182.0 lb

## 2014-05-05 DIAGNOSIS — N6311 Unspecified lump in the right breast, upper outer quadrant: Secondary | ICD-10-CM

## 2014-05-05 DIAGNOSIS — N63 Unspecified lump in breast: Secondary | ICD-10-CM | POA: Diagnosis not present

## 2014-05-05 NOTE — Progress Notes (Signed)
   Subjective:    Patient ID: Misty Snyder, female    DOB: 15-Nov-1960, 54 y.o.   MRN: [id number]  HPI Misty Snyder is a delightful 54 year old female nonsmoker who comes in today because of a lump in her right breast  She noticed a lump in her right breast about a week ago. No history of trauma. She's never had breast lumps in the past. Her last mammogram was June 2015 normal.  LMP 4 years ago uncomplicated  No postmenopausal symptoms  Family history negative for breast cancer however she tells me her mother had breast cysts.   Review of Systems    review of systems otherwise negative Objective:   Physical Exam  Well-developed well-nourished female no acute distress vital signs stable she's afebrile examination the breasts show symmetry no skin changes no nipple discharge or rash. Exam shows a rubbery soft tender lesion 2 inches from the nipple on the right. 10:00 position      Assessment & Plan:  Cystic lesion right breast............ ultrasound for confirmation

## 2014-05-05 NOTE — Progress Notes (Signed)
Pre visit review using our clinic review tool, if applicable. No additional management support is needed unless otherwise documented below in the visit note. 

## 2014-05-05 NOTE — Patient Instructions (Signed)
We will set you up an ultrasound to confirm indeed that is a cyst  Remember to do a thorough breast exam monthly as outlined

## 2014-05-11 ENCOUNTER — Other Ambulatory Visit: Payer: Self-pay | Admitting: Family Medicine

## 2014-05-11 DIAGNOSIS — N6311 Unspecified lump in the right breast, upper outer quadrant: Secondary | ICD-10-CM

## 2014-05-15 ENCOUNTER — Ambulatory Visit
Admission: RE | Admit: 2014-05-15 | Discharge: 2014-05-15 | Disposition: A | Discharge status: Home / Self Care | Payer: BC Managed Care – PPO | Source: Ambulatory Visit | Attending: Family Medicine | Admitting: Family Medicine

## 2014-05-15 ENCOUNTER — Other Ambulatory Visit: Payer: Self-pay | Admitting: Family Medicine

## 2014-05-15 DIAGNOSIS — C50919 Malignant neoplasm of unspecified site of unspecified female breast: Secondary | ICD-10-CM

## 2014-05-15 DIAGNOSIS — N6311 Unspecified lump in the right breast, upper outer quadrant: Secondary | ICD-10-CM

## 2014-05-15 DIAGNOSIS — R599 Enlarged lymph nodes, unspecified: Secondary | ICD-10-CM

## 2014-05-15 HISTORY — DX: Malignant neoplasm of unspecified site of unspecified female breast: C50.919

## 2014-05-18 ENCOUNTER — Other Ambulatory Visit: Payer: Self-pay | Admitting: Family Medicine

## 2014-05-18 DIAGNOSIS — C50911 Malignant neoplasm of unspecified site of right female breast: Secondary | ICD-10-CM

## 2014-05-19 ENCOUNTER — Other Ambulatory Visit: Payer: Self-pay

## 2014-05-19 ENCOUNTER — Other Ambulatory Visit: Payer: Self-pay | Admitting: Family Medicine

## 2014-05-19 DIAGNOSIS — C50911 Malignant neoplasm of unspecified site of right female breast: Secondary | ICD-10-CM

## 2014-05-20 ENCOUNTER — Ambulatory Visit
Admission: RE | Admit: 2014-05-20 | Discharge: 2014-05-20 | Disposition: A | Discharge status: Home / Self Care | Payer: BC Managed Care – PPO | Source: Ambulatory Visit | Attending: Family Medicine | Admitting: Family Medicine

## 2014-05-20 DIAGNOSIS — C50911 Malignant neoplasm of unspecified site of right female breast: Secondary | ICD-10-CM

## 2014-05-20 MED ORDER — GADOBENATE DIMEGLUMINE 529 MG/ML IV SOLN
17.0000 mL | Freq: Once | INTRAVENOUS | Status: AC | PRN
Start: 2014-05-20 — End: 2014-05-20
  Administered 2014-05-20: 17 mL via INTRAVENOUS

## 2014-05-21 ENCOUNTER — Other Ambulatory Visit: Payer: Self-pay | Admitting: Family Medicine

## 2014-05-21 DIAGNOSIS — R928 Other abnormal and inconclusive findings on diagnostic imaging of breast: Secondary | ICD-10-CM

## 2014-05-23 ENCOUNTER — Ambulatory Visit: Payer: Self-pay | Admitting: Surgery

## 2014-05-23 DIAGNOSIS — C50911 Malignant neoplasm of unspecified site of right female breast: Secondary | ICD-10-CM

## 2014-05-23 NOTE — H&P (Signed)
History of Present Illness Misty Snyder. Misty Pellman MD; 05/23/2014 7:33 PM) The patient is a 54 year old female who presents with breast cancer. Referred by Dr. Stevie Kern for evaluation of right breast cancer. This is a 54 year old family in relatively good health who presents with a palpable mass in her right breast. She had a mammogram in June of 2015 that was read as normal. She felt a mass in her upper outer right breast about one month ago. No sign of inflammation or infection. She underwent diagnostic right mammogram and ultrasound on 05/15/14 that showed a firm mass at 10:00. This mass was irregular and hypoechoic, measuring 2.6 x 1.4 x 2 cm. A lymph node was seen in the low right axilla with mildly thickened cortex measuring 1.3 x 0.9 x 1.0 cm. She underwent ultrasound-guided biopsy of the mass and the lymph node on 05/15/14. 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 The lymph node biopsy was negative. Left breast tomo mammogram was negative. She underwent MRI of both breasts on 05/20/14 that confirmed the presence of the 2.7 x 2.7 x 3.3 cm mass that was shown to be invasive ductal carcinoma. However, anterior and lateral to the mass, there is approximately 3 cm of non-mass enhancement. In the upper inner right breast there is an 8 x 8 x 8 mm irregular enhancing masss. In the right lower inner breast, there is a 6 mm enhancing mass. These are scheduled to be biopsied next week. There are multiple cortically thickened right axillary lymph nodes. There are also multiple nonspecific enhancing masses within the liver.   CLINICAL DATA: 54 year old female with a palpable abnormality in the upper-outer right breast.  EXAM: DIGITAL DIAGNOSTIC RIGHT MAMMOGRAM WITH 3D TOMOSYNTHESIS AND CAD  RIGHT BREAST ULTRASOUND  COMPARISON: Previous exams.  ACR Breast Density Category c: The breast tissue is heterogeneously dense, which may obscure small  masses.  FINDINGS: A spot compression tangential view was performed over the palpable site of concern in the upper-outer right breast with a possible mass/ asymmetry seen measuring approximately 2.5 cm, best seen on the tomosynthesis MLO view.  Mammographic images were processed with CAD.  Physical examination of the upper-outer right breast at site of palpable concern reveals a firm mass at the approximate 10 o'clock position.  Targeted ultrasound of the right breast was performed demonstrating an irregular hypoechoic mass at 10 o'clock 8 cm from the nipple measuring 2.6 x 1.4 x 2 cm. This corresponds with the site of palpable concern in the mammographic abnormality. A lymph node with a mildly thickened cortex is seen in the low right axilla measuring 1.3 x 0.9 x 1 cm.  IMPRESSION: 1. Suspicious right breast mass.  2. Indeterminate right axillary lymph node.  RECOMMENDATION: Ultrasound-guided biopsy of the suspicious mass in the upper-outer right breast as well as of the indeterminate right axillary lymph node is recommended. This will subsequently be performed and dictated separately.  I have discussed the findings and recommendations with the patient. Results were also provided in writing at the conclusion of the visit. If applicable, a reminder letter will be sent to the patient regarding the next appointment.  BI-RADS CATEGORY 4: Suspicious.   Electronically Signed By: Everlean Alstrom M.D. On: 05/15/2014 09:10  CLINICAL DATA: Suspicious mass in the upper-outer right breast.  EXAM: ULTRASOUND GUIDED RIGHT BREAST CORE NEEDLE BIOPSY  COMPARISON: Previous exam(s).  PROCEDURE: I met with the patient and we discussed the procedure of ultrasound-guided biopsy, including benefits  and alternatives. We discussed the high likelihood of a successful procedure. We discussed the risks of the procedure including infection, bleeding, tissue injury, clip migration,  and inadequate sampling. Informed written consent was given. The usual time-out protocol was performed immediately prior to the procedure.  Using sterile technique and 2% Lidocaine as local anesthetic, under direct ultrasound visualization, a 12 gauge vacuum-assisted device was used to perform biopsy of the suspicious mass in the upper-outer right breast at 10 o'clockusing a lateral to medial approach. At the conclusion of the procedure, a ribbon tissue marker clip was deployed into the biopsy cavity. Follow-up 2-view mammogram was performed and dictated separately.  Using sterile technique and 2% Lidocaine as local anesthetic, under direct ultrasound visualization, a 12 gauge vacuum-assisted device was used to perform biopsy of a lymph node with a mildly thickened cortex in the lower right axillausing a lateral to medial approach.  IMPRESSION: 1. Ultrasound-guided biopsy of the suspicious mass in the upper-outer right breast at 10 o'clock. No apparent complications.  2. Ultrasound-guided biopsy of an indeterminate lymph node in the low right axilla.   Electronically Signed By: Everlean Alstrom M.D. On: 05/15/2014 09:45  CLINICAL DATA: Suspicious mass in the upper-outer right breast.  EXAM: ULTRASOUND GUIDED RIGHT BREAST CORE NEEDLE BIOPSY  COMPARISON: Previous exam(s).  PROCEDURE: I met with the patient and we discussed the procedure of ultrasound-guided biopsy, including benefits and alternatives. We discussed the high likelihood of a successful procedure. We discussed the risks of the procedure including infection, bleeding, tissue injury, clip migration, and inadequate sampling. Informed written consent was given. The usual time-out protocol was performed immediately prior to the procedure.  Using sterile technique and 2% Lidocaine as local anesthetic, under direct ultrasound visualization, a 12 gauge vacuum-assisted device was used to perform biopsy of the  suspicious mass in the upper-outer right breast at 10 o'clockusing a lateral to medial approach. At the conclusion of the procedure, a ribbon tissue marker clip was deployed into the biopsy cavity. Follow-up 2-view mammogram was performed and dictated separately.  Using sterile technique and 2% Lidocaine as local anesthetic, under direct ultrasound visualization, a 12 gauge vacuum-assisted device was used to perform biopsy of a lymph node with a mildly thickened cortex in the lower right axillausing a lateral to medial approach.  IMPRESSION: 1. Ultrasound-guided biopsy of the suspicious mass in the upper-outer right breast at 10 o'clock. No apparent complications.  2. Ultrasound-guided biopsy of an indeterminate lymph node in the low right axilla.   Electronically Signed By: Everlean Alstrom M.D. On: 05/15/2014 09:45   CLINICAL DATA: Patient with recent diagnosis right breast invasive ductal carcinoma.  EXAM: BILATERAL BREAST MRI WITH AND WITHOUT CONTRAST  TECHNIQUE: Multiplanar, multisequence MR images of both breasts were obtained prior to and following the intravenous administration of 17 ml of MultiHance.  THREE-DIMENSIONAL MR IMAGE RENDERING ON INDEPENDENT WORKSTATION:  Three-dimensional MR images were rendered by post-processing of the original MR data on an independent workstation. The three-dimensional MR images were interpreted, and findings are reported in the following complete MRI report for this study. Three dimensional images were evaluated at the independent DynaCad workstation  COMPARISON: Previous exam(s).  FINDINGS: Breast composition: c. Heterogeneous fibroglandular tissue.  Background parenchymal enhancement: Moderate.  Right breast: Within upper-outer right breast middle to posterior depth there is a 2.7 x 2.7 x 3.3 cm heterogeneously enhancing mass compatible with recently biopsied right breast carcinoma. Extending anterior and lateral  to the mass is approximately 3 cm of non mass enhancement.  This may potentially represent post biopsy changes and/or associated disease.  Within the upper inner right breast anterior depth there is an 8 x 8 x 8 mm irregular enhancing mass.  Within the lower inner right breast middle depth there is a 6 x 6 x 6 mm oval enhancing mass.  Left breast: Scattered foci of enhancement are demonstrated. No suspicious enhancing mass.  Lymph nodes: There are multiple cortically thickened right axillary lymph nodes. The most cortically thickened lymph node measures 1.2 cm.  Ancillary findings: Multiple T2 bright masses are demonstrated throughout the liver measuring up to 10 mm.  IMPRESSION: 1. Irregular enhancing mass within the upper-outer right breast compatible with biopsy-proven carcinoma. Surrounding non mass enhancement potentially secondary to biopsy changes or associated disease. 2. Indeterminate 8 mm irregular enhancing mass within the upper inner right breast. 3. Indeterminate enhancing mass within the lower inner right breast. 4. Multiple cortically thickened right axillary lymph nodes. A previous right axillary lymph node biopsy has been performed. 5. Multiple nonspecific T2 bright masses within the liver. These may potentially represent cysts however are indeterminate on non dedicated imaging.  RECOMMENDATION: 1. MRI guided core needle biopsy of the enhancing masses within the upper inner and lower inner right breast. 2. Consider dedicated evaluation of the liver with pre and post contrast enhanced MRI.  BI-RADS CATEGORY 4: Suspicious.   Electronically Signed By: Lovey Newcomer M.D. On: 05/20/2014 16:08   Menarche - age 67 Nulliparous Hormones - OCP x 22 years Menopause age 43 Family history - negative Other Problems Elbert Ewings, CMA; 05/22/2014 11:22 AM) Breast Cancer Lump In Breast  Past Surgical History Elbert Ewings, CMA; 05/22/2014 11:22 AM) Breast  Biopsy Right. Spinal Surgery - Neck  Diagnostic Studies History Elbert Ewings, CMA; 05/22/2014 11:22 AM) Colonoscopy 5-10 years ago Mammogram within last year Pap Smear 1-5 years ago  Allergies Elbert Ewings, CMA; 05/22/2014 11:22 AM) No Known Drug Allergies 05/22/2014  Medication History Elbert Ewings, CMA; 05/22/2014 11:23 AM) Amphetamine-Dextroamphetamine (30MG Tablet, Oral) Active. Citalopram Hydrobromide (20MG Tablet, Oral) Active. HydrOXYzine Pamoate (50MG Capsule, Oral) Active. Adderall (30MG Tablet, Oral) Active. Medications Reconciled  Social History Elbert Ewings, [place]; 05/22/2014 11:22 AM) No alcohol use No caffeine use No drug use Tobacco use Never smoker.  Family History Elbert Ewings, [place]; 05/22/2014 11:22 AM) First Degree Relatives No pertinent family history  Pregnancy / Birth History Elbert Ewings, [place]; 05/22/2014 11:22 AM) Age at menarche 5 years. Age of menopause 51-55 Contraceptive History Oral contraceptives. Gravida 0 Para 0 Regular periods     Review of Systems Elbert Ewings CMA; 05/22/2014 11:22 AM) General Present- Weight Gain. Not Present- Appetite Loss, Chills, Fatigue, Fever, Night Sweats and Weight Loss. Skin Present- Dryness. Not Present- Change in Wart/Mole, Hives, Jaundice, New Lesions, Non-Healing Wounds, Rash and Ulcer. HEENT Present- Wears glasses/contact lenses. Not Present- Earache, Hearing Loss, Hoarseness, Nose Bleed, Oral Ulcers, Ringing in the Ears, Seasonal Allergies, Sinus Pain, Sore Throat, Visual Disturbances and Yellow Eyes. Breast Present- Breast Mass. Not Present- Breast Pain, Nipple Discharge and Skin Changes. Gastrointestinal Present- Constipation and Hemorrhoids. Not Present- Abdominal Pain, Bloating, Bloody Stool, Change in Bowel Habits, Chronic diarrhea, Difficulty Swallowing, Excessive gas, Gets full quickly at meals, Indigestion, Nausea, Rectal Pain and Vomiting. Female Genitourinary Not Present- Frequency,  Nocturia, Painful Urination, Pelvic Pain and Urgency. Musculoskeletal Present- Back Pain. Not Present- Joint Pain, Joint Stiffness, Muscle Pain, Muscle Weakness and Swelling of Extremities. Neurological Not Present- Decreased Memory, Fainting, Headaches, Numbness, Seizures, Tingling, Tremor, Trouble walking and Weakness. Psychiatric  Not Present- Anxiety, Bipolar, Change in Sleep Pattern, Depression, Fearful and Frequent crying. Endocrine Present- Hot flashes. Not Present- Cold Intolerance, Excessive Hunger, Hair Changes, Heat Intolerance and New Diabetes. Hematology Not Present- Easy Bruising, Excessive bleeding, Gland problems, HIV and Persistent Infections.  Vitals Elbert Ewings CMA; 05/22/2014 11:24 AM) 05/22/2014 11:23 AM Weight: 181 lb Height: 63in Body Surface Area: 1.91 m Body Mass Index: 32.06 kg/m Temp.: 97.99F(Oral)  Pulse: 74 (Regular)  Resp.: 17 (Unlabored)  BP: 122/78 (Sitting, Left Arm, Standard)     Physical Exam Rodman Key K. Mckennon Zwart MD; 05/23/2014 5:31 PM)  The physical exam findings are as follows: Note:WDWN in NAD HEENT: EOMI, sclera anicteric Neck: No masses, no thyromegaly Breasts: symmetric; no nipple retraction or discharge; no axillary lymphadenopathy No sign of left breast mass Palpable right breast mass - upper outer quadrant near axilla - 3 cm; no overlying skin changes Lungs: CTA bilaterally; normal respiratory effort CV: Regular rate and rhythm; no murmurs Abd: +bowel sounds, soft, non-tender, no masses Ext: Well-perfused; no edema Skin: Warm, dry; no sign of jaundice    Assessment & Plan Rodman Key K. Shawnika Pepin MD; 05/23/2014 7:33 PM)  CANCER OF RIGHT BREAST, STAGE 1, ESTROGEN RECEPTOR NEGATIVE (174.9  C50.911)  Current Plans Pt Education - [employer] Free Text Education/Instructions: discussed with patient and provided information. Note:I spent a considerable amount of time with the patient and her mother, cousin, and friend. We discussed her  diagnosis and the significance of the triple-negative status. The lymph nodes appear abnormal on ultrasound and MRI, although the biopsy was negative. These certainly need to be investigated further. The two lesions seen on MRI are to be biopsied next week. We will await the results from those biopsies before determining a surgical plan. It is possible that if she has more extensive disease, she may be a candidate for neoadjuvant chemotherapy. Alternatively, she may benefit from mastectomy with sentinel lymph node biopsy. We will await the biopsy results and discuss her case at Breast Conference this week. If she opts for mastectomy (with or without reconstruction), then I have discussed the procedure with the patient. Potential risks, benefits, alternative treatments, and expected outcomes have been explained. All of the patient's questions at this time have been answered. The likelihood of reaching the patient's treatment goal is good. The patient understand the proposed surgical procedure. We will discuss this further after the biopsies.   Misty Snyder. Georgette Dover, MD, [facility]  General/ Trauma Surgery  05/23/2014 7:34 PM

## 2014-05-25 ENCOUNTER — Ambulatory Visit
Admission: RE | Admit: 2014-05-25 | Discharge: 2014-05-25 | Disposition: A | Discharge status: Home / Self Care | Payer: BC Managed Care – PPO | Source: Ambulatory Visit | Attending: Family Medicine | Admitting: Family Medicine

## 2014-05-25 DIAGNOSIS — R928 Other abnormal and inconclusive findings on diagnostic imaging of breast: Secondary | ICD-10-CM

## 2014-05-27 ENCOUNTER — Ambulatory Visit: Payer: Self-pay | Admitting: Surgery

## 2014-05-27 ENCOUNTER — Other Ambulatory Visit: Payer: Self-pay | Admitting: Surgery

## 2014-05-27 ENCOUNTER — Telehealth: Payer: Self-pay | Admitting: *Deleted

## 2014-05-27 DIAGNOSIS — C50911 Malignant neoplasm of unspecified site of right female breast: Secondary | ICD-10-CM

## 2014-05-27 NOTE — Telephone Encounter (Signed)
Received referral from [facility].  Called pt and confirmed 05/29/14 appt w/ her.  Unable to mail before appt letter - gave verbal.  Unable to mail welcoming packet - gave directions and instructions.  Unable to mail intake form -  Placed a note for one to be given at time of check in.  Emailed Engineer, civil (consulting) at [employer] to make her aware.  Placed a copy of records in Dr. Geralyn Flash box and took one to HIM to scan.

## 2014-05-29 ENCOUNTER — Ambulatory Visit: Payer: BC Managed Care – PPO

## 2014-05-29 ENCOUNTER — Telehealth: Payer: Self-pay | Admitting: Hematology and Oncology

## 2014-05-29 ENCOUNTER — Ambulatory Visit (HOSPITAL_BASED_OUTPATIENT_CLINIC_OR_DEPARTMENT_OTHER): Payer: BC Managed Care – PPO | Admitting: Hematology and Oncology

## 2014-05-29 VITALS — BP 119/65 | HR 80 | Temp 98.9°F | Resp 18 | Ht 63.0 in | Wt 184.6 lb

## 2014-05-29 DIAGNOSIS — C50411 Malignant neoplasm of upper-outer quadrant of right female breast: Secondary | ICD-10-CM | POA: Insufficient documentation

## 2014-05-29 DIAGNOSIS — Z853 Personal history of malignant neoplasm of breast: Secondary | ICD-10-CM | POA: Insufficient documentation

## 2014-05-29 DIAGNOSIS — Z171 Estrogen receptor negative status [ER-]: Secondary | ICD-10-CM | POA: Diagnosis not present

## 2014-05-29 NOTE — Progress Notes (Signed)
[place] NOTE  Patient Care Team: Dorena Cookey, MD as PCP - General  CHIEF COMPLAINTS/PURPOSE OF CONSULTATION:  Newly diagnosed breast cancer  HISTORY OF PRESENTING ILLNESS:  Misty Snyder 54 y.o. female is here because of recent diagnosis of right breast cancer. Patient felt a mass in the upper outer quadrant of the right breast about 1-2 months ago and underwent a diagnostic right breast mammogram and ultrasound 05/15/2014 that showed a 2.6 cm mass along with enlarged lymph nodes in the axilla. She underwent ultrasound-guided biopsy of the mass in the lymph: 05/15/2014. The tumor came back as invasive ductal carcinoma triple negative with a Ki-67 of 90%. Liver biopsy was negative. She denies underwent MRI of the breast and 05/20/2014 that showed a 3.3 cm mass with an additional 3 cm non-mass enhancement. There are additional 2 other masses 8 and 6 mm in size. She saw Dr.Tsui who discussed the different treatment options including lumpectomy versus mastectomy. In order to breath to breast conserving surgery and additional enhancement as well as 2 additional masses will need to be biopsied. Patient does not want to go through any further biopsy site chosen to undergo mastectomy. She was presented at the multidisciplinary tumor board and she is here today to discuss a treatment plan accompanied by her family.  I reviewed her records extensively and collaborated the history with the patient.  SUMMARY OF ONCOLOGIC HISTORY:   Breast cancer of upper-outer quadrant of right female breast   05/15/2014 Initial Biopsy Right breast biopsy 10:00: Invasive ductal carcinoma grade 3, ER 0% PR 0% HER-2 negative, Ki-67 90%   05/20/2014 Breast MRI Right breast mass: 2.7 x 2.7 x 3.3 cm along with 3 cm non-mass enhancement, upper inner quadrant 8 mm mass, lower inner quadrant 6 mm mass, right axillary lymph nodes 1.2 cm, T2 bright liver lesions   MEDICAL HISTORY:  Past Medical History   Diagnosis Date  . Depression   . Uterine fibroid     SURGICAL HISTORY: Past Surgical History  Procedure Laterality Date  . Lumbar disc surgery      SOCIAL HISTORY: History   Social History  . Marital Status: Single    Spouse Name: N/A  . Number of Children: N/A  . Years of Education: N/A   Occupational History  . Not on file.   Social History Main Topics  . Smoking status: Never Smoker   . Smokeless tobacco: Not on file  . Alcohol Use: Not on file  . Drug Use: Not on file  . Sexual Activity: Not on file   Other Topics Concern  . Not on file   Social History Narrative    FAMILY HISTORY: Family History  Problem Relation Age of Onset  . Alcohol abuse Other     ALLERGIES:  has no allergies on file.  MEDICATIONS:  Current Outpatient Prescriptions  Medication Sig Dispense Refill  . amphetamine-dextroamphetamine (ADDERALL) 30 MG tablet Take 1 tablet by mouth daily. 30 tablet 0  . citalopram (CELEXA) 40 MG tablet TAKE 1 TABLET (40 MG TOTAL) BY MOUTH DAILY. 90 tablet 3  . HYDROcodone-acetaminophen (VICODIN ES) 7.5-750 MG per tablet Half tab at bedtime as needed 30 tablet 2  . hydrOXYzine (ATARAX/VISTARIL) 50 MG tablet Take 50 mg by mouth 3 (three) times daily as needed (for sleep).     No current facility-administered medications for this visit.    REVIEW OF SYSTEMS:   Constitutional: Denies fevers, chills or abnormal night sweats Eyes: Denies  blurriness of vision, double vision or watery eyes Ears, nose, mouth, throat, and face: Denies mucositis or sore throat Respiratory: Denies cough, dyspnea or wheezes Cardiovascular: Denies palpitation, chest discomfort or lower extremity swelling Gastrointestinal:  Denies nausea, heartburn or change in bowel habits Skin: Denies abnormal skin rashes Lymphatics: Denies new lymphadenopathy or easy bruising Neurological:Denies numbness, tingling or new weaknesses Behavioral/Psych: Mood is stable, no new changes  Breast:  Palpable lump in the right breast All other systems were reviewed with the patient and are negative.  PHYSICAL EXAMINATION: ECOG PERFORMANCE STATUS: 1 - Symptomatic but completely ambulatory  Filed Vitals:   05/29/14 1315  BP: 119/65  Pulse: 80  Temp: 98.9 F (37.2 C)  Resp: 18   Filed Weights   05/29/14 1315  Weight: 184 lb 9.6 oz (83.734 kg)    GENERAL:alert, no distress and comfortable SKIN: skin color, texture, turgor are normal, no rashes or significant lesions EYES: normal, conjunctiva are pink and non-injected, sclera clear OROPHARYNX:no exudate, no erythema and lips, buccal mucosa, and tongue normal  NECK: supple, thyroid normal size, non-tender, without nodularity LYMPH:  no palpable lymphadenopathy in the cervical, axillary or inguinal LUNGS: clear to auscultation and percussion with normal breathing effort HEART: regular rate & rhythm and no murmurs and no lower extremity edema ABDOMEN:abdomen soft, non-tender and normal bowel sounds Musculoskeletal:no cyanosis of digits and no clubbing  PSYCH: alert & oriented x 3 with fluent speech NEURO: no focal motor/sensory deficits BREAST: Palpable lump in the right breast. No palpable axillary or supraclavicular lymphadenopathy (exam performed in the presence of a chaperone)   LABORATORY DATA:  I have reviewed the data as listed Lab Results  Component Value Date   WBC 4.3 02/10/2014   HGB 13.4 02/10/2014   HCT 40.4 02/10/2014   MCV 82.5 02/10/2014   PLT 266.0 02/10/2014   Lab Results  Component Value Date   NA 140 02/10/2014   K 4.0 02/10/2014   CL 104 02/10/2014   CO2 29 02/10/2014    RADIOGRAPHIC STUDIES: I have personally reviewed the radiological reports and agreed with the findings in the report. Results summarized as above  ASSESSMENT AND PLAN:  Breast cancer of upper-outer quadrant of right female breast Right breast invasive ductal carcinoma 2.7 x 2.7 x 3.3 cm mass with 3 cm non-mass enhancement, 0.8  cm, 0.6 cm additional oval masses in the right breast, multiple enlarged right axillary lymph nodes biopsy benign, T2 liver changes, MRI liver being planned. T2 N0 M0 stage II a clinical stage ER 0%, PR 0%, HER-2 negative, Ki-67 90%  Pathology and radiology counseling:Discussed with the patient, the details of pathology including the type of breast cancer,the clinical staging, the significance of ER, PR and HER-2/neu receptors and the implications for treatment. After reviewing the pathology in detail, we proceeded to discuss the different treatment options between surgery, radiation, chemotherapy, antiestrogen therapies.  Recommendation: 1. Mastectomy with sentinel lymph node biopsy 2. Adjuvant chemotherapy with dose dense Adriamycin and Cytoxan followed by Abraxane weekly 12 3. Radiation therapy may or may not be necessary. I will recommend preop radiation consultation because patient has multiple questions regarding reconstruction and how radiation would affect that. 4. Genetic counseling 5. Liver MRI for further evaluation of the liver  I discussed the case with Dr. Molli Posey, who will plan her surgery. I will see her one week postoperatively to discuss final treatment plan. She will get port placement during surgery.  Chemotherapy counseling:I have discussed the risks and benefits of chemotherapy  including the risks of nausea/ vomiting, risk of infection from low WBC count, fatigue due to chemo or anemia, bruising or bleeding due to low platelets, mouth sores, loss/ change in taste and decreased appetite. Liver and kidney function will be monitored through out chemotherapy as abnormalities in liver and kidney function may be a side effect of treatment. Cardiac dysfunction due to Adriamycin was discussed in detail. Risk of permanent bone marrow dysfunction and leukemia due to chemo were also discussed.  Return to clinic 1 week after surgery.   All questions were answered. The patient knows to call  the clinic with any problems, questions or concerns.    Rulon Eisenmenger, MD 3:32 PM

## 2014-05-29 NOTE — Telephone Encounter (Signed)
Called and left a message with genetics appointment,echo to linda to precert,also left message for her to call for her mri,the nurse/or navigator will advise of surgery date and then we will make a follow up appointment

## 2014-05-29 NOTE — Assessment & Plan Note (Signed)
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 T2 N0 M0 stage II a clinical stage ER 0%, PR 0%, HER-2 negative, Ki-67 90%  Pathology and radiology counseling:Discussed with the patient, the details of pathology including the type of breast cancer,the clinical staging, the significance of ER, PR and HER-2/neu receptors and the implications for treatment. After reviewing the pathology in detail, we proceeded to discuss the different treatment options between surgery, radiation, chemotherapy, antiestrogen therapies.  Recommendation: 1. Mastectomy with sentinel lymph node biopsy 2. Adjuvant chemotherapy with dose dense Adriamycin and Cytoxan followed by Abraxane weekly 12 3. Radiation therapy may or may not be necessary. I will recommend preop radiation consultation because patient has multiple questions regarding reconstruction and how radiation would affect that. 4. Genetic counseling 5. Liver MRI for further evaluation of the liver  I discussed the case with Dr. Molli Posey, who will plan her surgery. I will see her one week postoperatively to discuss final treatment plan. She will get port placement during surgery.  Chemotherapy counseling:I have discussed the risks and benefits of chemotherapy including the risks of nausea/ vomiting, risk of infection from low WBC count, fatigue due to chemo or anemia, bruising or bleeding due to low platelets, mouth sores, loss/ change in taste and decreased appetite. Liver and kidney function will be monitored through out chemotherapy as abnormalities in liver and kidney function may be a side effect of treatment. Cardiac dysfunction due to Adriamycin was discussed in detail. Risk of permanent bone marrow dysfunction and leukemia due to chemo were also  discussed.  Return to clinic 1 week after surgery.

## 2014-06-01 ENCOUNTER — Telehealth: Payer: Self-pay | Admitting: Hematology and Oncology

## 2014-06-01 ENCOUNTER — Encounter: Payer: Self-pay | Admitting: *Deleted

## 2014-06-01 NOTE — Telephone Encounter (Signed)
Called and spoke with patient and she is aware of her echo  anne

## 2014-06-01 NOTE — Progress Notes (Signed)
Spoke to pt concerning post op and adjuvant chemotherapy s/s. Informed pt we will schedule her with Dr. Lindi Adie for f/u 1 wk post op to discuss chemo regimen and side effects. Pt would like to discuss post op recovery with Dr. Vonna Kotyk nurse. I have left message with her to discuss pts questions. Encourage pt to call with further needs or questions. Received verbal understanding.

## 2014-06-01 NOTE — Progress Notes (Unsigned)
Spoke with patient on 05/29/14 with her appointment with Dr. Lindi Adie.  She is a bit overwhelmed.  Discussed navigation resources and gave her contact information.  Encouraged her to call with any needs or concerns.

## 2014-06-02 ENCOUNTER — Telehealth: Payer: Self-pay | Admitting: Hematology and Oncology

## 2014-06-02 ENCOUNTER — Ambulatory Visit
Admission: RE | Admit: 2014-06-02 | Discharge: 2014-06-02 | Disposition: A | Discharge status: Home / Self Care | Payer: BC Managed Care – PPO | Source: Ambulatory Visit | Attending: Hematology and Oncology | Admitting: Hematology and Oncology

## 2014-06-02 DIAGNOSIS — C50411 Malignant neoplasm of upper-outer quadrant of right female breast: Secondary | ICD-10-CM

## 2014-06-02 MED ORDER — GADOBENATE DIMEGLUMINE 529 MG/ML IV SOLN
16.0000 mL | Freq: Once | INTRAVENOUS | Status: AC | PRN
  Administered 2014-06-02: 16 mL via INTRAVENOUS

## 2014-06-02 NOTE — Progress Notes (Addendum)
Location of Breast Cancer: Right Breast   10 o'clock position Upper Outer quadrant   Histology per Pathology Report: 05/15/14: Right Breast Biopsy=InvasiveDuctal carcinoma  Receptor Status: ER( neg 0%), PR (neg 0%   ), Her2-neu (  Neg, Ki-67 90%)  Did patient present with symptoms (if so, please note symptoms) or was this found on screening mammography?: patient felt herself a mass right breast  Past/Anticipated interventions by surgeon, if any: Dr. Molli Posey   Past/Anticipated interventions by medical oncology, if any: Chemotherapy : Dr. Lindi Adie  05/29/14 =recommendation genetic counseling,Liver MRI evaluation of the liver,Chemo education 06/08/14'@1000am' , follow up Dr. Lindi Adie 06/17/14  Lymphedema issues, if any:   NO  Pain issues, if any:    SAFETY ISSUES: NO  Prior radiation? NO  Pacemaker/ICD? NO  Possible current pregnancy? NO  Is the patient on methotrexate? NO  Current Complaints / other details:  Single,menarache age 54 GXPO,  Hormones oral  Contraceptives  x22 years,menopause age [age], Maternal grandmother cancer, maternal grandfather prostate cancer Non smoker, no alcohol no drug use,  Allergies: NKA    Jeyden Coffelt, Felicita Gage, RN 06/02/2014,10:59 AM

## 2014-06-02 NOTE — Telephone Encounter (Signed)
Appointments made and a note has been placed for patient to get a new schedule 5/18

## 2014-06-03 ENCOUNTER — Ambulatory Visit (HOSPITAL_BASED_OUTPATIENT_CLINIC_OR_DEPARTMENT_OTHER): Payer: BC Managed Care – PPO | Admitting: Genetic Counselor

## 2014-06-03 ENCOUNTER — Other Ambulatory Visit: Payer: BC Managed Care – PPO

## 2014-06-03 ENCOUNTER — Encounter: Payer: Self-pay | Admitting: Genetic Counselor

## 2014-06-03 ENCOUNTER — Ambulatory Visit (HOSPITAL_COMMUNITY): Payer: BC Managed Care – PPO | Attending: Hematology and Oncology

## 2014-06-03 DIAGNOSIS — Z809 Family history of malignant neoplasm, unspecified: Secondary | ICD-10-CM

## 2014-06-03 DIAGNOSIS — Z315 Encounter for genetic counseling: Secondary | ICD-10-CM

## 2014-06-03 DIAGNOSIS — C50411 Malignant neoplasm of upper-outer quadrant of right female breast: Secondary | ICD-10-CM | POA: Diagnosis not present

## 2014-06-03 NOTE — Progress Notes (Signed)
Patient Name: Misty Snyder Patient Age: 54 y.o. Encounter Date: 06/03/2014  Referring Physician: Nicholas Lose, MD  Primary Care Provider: Joycelyn Man, MD   Misty Snyder, a 54 y.o. female, is being seen at the [facility] due to a personal history of breast cancer. She presents to clinic today to discuss the possibility of a hereditary predisposition to cancer and discuss whether genetic testing is warranted.  HISTORY OF PRESENT ILLNESS: Misty Snyder was recently diagnosed with right breast cancer at the age of 79. She is scheduled for a right mastectomy on 06/17/14. She stated the results of genetic testing will NOT alter her surgical plans. The breast tumor was ER negative, PR negative, and HER2 negative.    Breast cancer of upper-outer quadrant of right female breast   05/15/2014 Initial Biopsy Right breast biopsy 10:00: Invasive ductal carcinoma grade 3, ER 0% PR 0% HER-2 negative, Ki-67 90%   05/20/2014 Breast MRI Right breast mass: 2.7 x 2.7 x 3.3 cm along with 3 cm non-mass enhancement, upper inner quadrant 8 mm mass, lower inner quadrant 6 mm mass, right axillary lymph nodes 1.2 cm, T2 bright liver lesions    Past Medical History  Diagnosis Date  . Depression   . Uterine fibroid   . Malignant neoplasm of upper-outer quadrant of right female breast     Past Surgical History  Procedure Laterality Date  . Lumbar disc surgery      History   Social History  . Marital Status: Single    Spouse Name: N/A  . Number of Children: N/A  . Years of Education: N/A   Social History Main Topics  . Smoking status: Never Smoker   . Smokeless tobacco: Not on file  . Alcohol Use: Not on file  . Drug Use: Not on file  . Sexual Activity: Not on file   Other Topics Concern  . Not on file   Social History Narrative     FAMILY HISTORY:   During the visit, a 4-generation pedigree was obtained. Family tree will be sent for scanning and will be in EPIC  under the Media tab.  Significant diagnoses include the following:  Family History  Problem Relation Age of Onset  . Alcohol abuse Other   . Cancer Maternal Grandmother     Dx 73s; prostate  . Cancer Maternal Grandfather     Dx 39s; unknown type    Additionally, Misty Snyder has no children. Her only brother (age 59) is cancer-free. Her mother (age 66) had a TAH/BSO prior to menopause. Her mother had no full siblings, but did have 3 maternal half-sisters who did not have cancer. Misty Snyder knows very little about her biological father and his family.  Misty Snyder' ancestry is African American. There is no known Jewish ancestry and no consanguinity.  ASSESSMENT AND PLAN: Misty Snyder is a 54 y.o. female with a personal history of triple negative breast cancer. This history is not highly suggestive of a hereditary predisposition to cancer,but she has very little information about her paternal relatives and her mother had a TAH/BSO prior to menopause. Genetic testing is indicated given her triple negative breast cancer. We reviewed the characteristics, features and inheritance patterns of hereditary cancer syndromes. We discussed genetic testing, including the process of testing, insurance coverage and implications of results. A negative result will be very reassuring.  Misty Snyder wished to pursue genetic testing and a blood sample will be sent to [facility] for analysis of  the 17 genes on the BreastNext gene panel. We discussed the implications of a positive, negative and/ or Variant of Uncertain Significance (VUS) result. Results should be available in approximately 4 weeks, at which point we will contact her and address implications for her as well as address genetic testing for at-risk family members, if needed.    We encouraged Misty Snyder to remain in contact with Cancer Genetics annually so that we can update the family history and inform her of any changes in cancer genetics and testing that  may be of benefit for this family. Ms.  Snyder' questions were answered to her satisfaction today.   Thank you for the referral and allowing [place] to share in the care of your patient.   The patient was seen for a total of 30 minutes, greater than 50% of which was spent face-to-face counseling. This patient was discussed with the overseeing provider who agrees with the above.   Steele Berg, MS, [place] Certified Genetic Counselor phone: [phone] [email or web address]

## 2014-06-04 ENCOUNTER — Encounter: Payer: Self-pay | Admitting: Radiation Oncology

## 2014-06-08 ENCOUNTER — Encounter: Payer: Self-pay | Admitting: Radiation Oncology

## 2014-06-08 ENCOUNTER — Ambulatory Visit
Admission: RE | Admit: 2014-06-08 | Discharge: 2014-06-08 | Disposition: A | Discharge status: Home / Self Care | Payer: BC Managed Care – PPO | Source: Ambulatory Visit | Attending: Radiation Oncology | Admitting: Radiation Oncology

## 2014-06-08 ENCOUNTER — Encounter: Payer: Self-pay | Admitting: General Practice

## 2014-06-08 ENCOUNTER — Other Ambulatory Visit: Payer: BC Managed Care – PPO

## 2014-06-08 VITALS — BP 124/71 | HR 82 | Temp 97.6°F | Resp 20 | Ht 63.0 in | Wt 184.7 lb

## 2014-06-08 DIAGNOSIS — C50411 Malignant neoplasm of upper-outer quadrant of right female breast: Secondary | ICD-10-CM | POA: Diagnosis present

## 2014-06-08 HISTORY — DX: Malignant neoplasm of unspecified site of unspecified female breast: C50.919

## 2014-06-08 NOTE — Progress Notes (Signed)
Radiation Oncology         [phone] ________________________________  Name: Misty Snyder MRN: [id number]  Date: 06/08/2014  DOB: [birth year]  RA:QTMA,UQJFHLK ALLEN, MD  Misty Lose, MD     REFERRING PHYSICIAN: Nicholas Lose, MD   DIAGNOSIS: The encounter diagnosis was Breast cancer of upper-outer quadrant of right female breast.    HISTORY OF PRESENT ILLNESS::Misty Snyder is a 54 y.o. female who is seen for an initial consultation visit regarding the patient's diagnosis of breast cancer of upper-outer quadrant of right female breast. The patient presented with no associated pain or symptoms and notes overall good health.  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 Biopsy of lymph node rt axilla negative.   Right breast mass: 2.7 x 2.7 x 3.3 cm along with 3 cm non-mass enhancement, upper inner quadrant 8 mm mass, lower inner quadrant 6 mm mass, right axillary lymphs enlarged. Patient denies further biopsies, has decided to proceed with mastectomy.  PREVIOUS RADIATION THERAPY: No   PAST MEDICAL HISTORY:  has a past medical history of Depression; Uterine fibroid; Malignant neoplasm of upper-outer quadrant of right female breast; and Breast cancer (05/15/14).     PAST SURGICAL HISTORY: Past Surgical History  Procedure Laterality Date  . Lumbar disc surgery    . Neck surgery       FAMILY HISTORY: family history includes Alcohol abuse in her other; Cancer in her maternal grandfather and maternal grandmother.   SOCIAL HISTORY:  reports that she has never smoked. She has never used smokeless tobacco. She reports that she does not drink alcohol.   ALLERGIES: Review of patient's allergies indicates no known allergies.   MEDICATIONS:  Current Outpatient Prescriptions  Medication Sig Dispense Refill  . amphetamine-dextroamphetamine (ADDERALL) 30 MG tablet Take 1 tablet by mouth daily. 30 tablet 0  .  citalopram (CELEXA) 40 MG tablet TAKE 1 TABLET (40 MG TOTAL) BY MOUTH DAILY. 90 tablet 3  . hydrOXYzine (ATARAX/VISTARIL) 50 MG tablet Take 50 mg by mouth 3 (three) times daily as needed (for sleep).    Marland Kitchen HYDROcodone-acetaminophen (VICODIN ES) 7.5-750 MG per tablet Half tab at bedtime as needed (Patient not taking: Reported on 06/08/2014) 30 tablet 2   No current facility-administered medications for this encounter.     REVIEW OF SYSTEMS:  A 15 point review of systems is documented in the electronic medical record. This was obtained by the nursing staff. However, I reviewed this with the patient to discuss relevant findings and make appropriate changes.  Pertinent items are noted in HPI.    PHYSICAL EXAM:  height is _0  (1.6 m) and weight is 184 lb 11.2 oz (83.779 kg). Her oral temperature is 97.6 F (36.4 C). Her blood pressure is 124/71 and her pulse is 82. Her respiration is 20.   ECOG = 1  0 - Asymptomatic (Fully active, able to carry on all predisease activities without restriction)  1 - Symptomatic but completely ambulatory (Restricted in physically strenuous activity but ambulatory and able to carry out work of a light or sedentary nature. For example, light housework, office work)  2 - Symptomatic, <50% in bed during the day (Ambulatory and capable of all self care but unable to carry out any work activities. Up and about more than 50% of waking hours)  3 - Symptomatic, >50% in bed, but not bedbound (Capable of only limited self-care, confined to bed or chair 50% or more  of waking hours)  4 - Bedbound (Completely disabled. Cannot carry on any self-care. Totally confined to bed or chair)  5 - Death   Eustace Pen MM, Creech RH, Tormey DC, et al. [phone]). "Toxicity and response criteria of the [facility]". [place] Oncol. 5 (6): 649-55    General: Well-developed, in no acute distress HEENT: Normocephalic, atraumatic; oral cavity clear Neck: Supple without any  lymphadenopathy Cardiovascular: Regular rate and rhythm Respiratory: Clear to auscultation bilaterally GI: Soft, nontender, normal bowel sounds Extremities: No edema present Neuro: No focal deficits Breast: Palpable mass in upper outer quadrant of right breast, no palpable axillary adenopathy, unremarkable breast exam on left side   LABORATORY DATA:  Lab Results  Component Value Date   WBC 4.3 02/10/2014   HGB 13.4 02/10/2014   HCT 40.4 02/10/2014   MCV 82.5 02/10/2014   PLT 266.0 02/10/2014   Lab Results  Component Value Date   NA 140 02/10/2014   K 4.0 02/10/2014   CL 104 02/10/2014   CO2 29 02/10/2014   Lab Results  Component Value Date   ALT 13 02/10/2014   AST 16 02/10/2014   ALKPHOS 66 02/10/2014   BILITOT 0.3 02/10/2014      RADIOGRAPHY: Mr Liver W Wo Contrast  06/02/2014   CLINICAL DATA:  Indeterminate lesions in the liver on breast MRI. Patient with breast carcinoma.  EXAM: MRI ABDOMEN WITHOUT AND WITH CONTRAST  TECHNIQUE: Multiplanar multisequence MR imaging of the abdomen was performed both before and after the administration of intravenous contrast.  CONTRAST:  47m MULTIHANCE GADOBENATE DIMEGLUMINE 529 MG/ML IV SOLN  COMPARISON:  Breast MRI 05/20/2014  FINDINGS: Lower chest:  Lung bases are clear.  Hepatobiliary: Multiple round lesions within the left and right hepatic lobe which are well-circumscribed and hyperintense on T2 weighted imaging. The majority these lesions are in the left hepatic lobe. These lesions are small with the largest lesions measuring 15 mm. For example 2 adjacent lesions on image 16, series 9 measure 10 and 7 mm. These lesions demonstrate no post-contrast enhancement (series 15 and 17) and are therefore consistent with benign hepatic cysts.  No biliary duct dilatation. The gallbladder is normal. Common bile duct normal.  Pancreas: Normal pancreatic parenchymal intensity. No ductal dilatation or inflammation.  Spleen: Normal spleen.   Adrenals/urinary tract: Adrenal glands and kidneys are normal.  Stomach/Bowel: Stomach and limited of the small bowel is unremarkable  Vascular/Lymphatic: Abdominal aortic normal caliber. No retroperitoneal periportal lymphadenopathy.  Musculoskeletal: No aggressive osseous lesion  IMPRESSION: Multiple benign hepatic cysts.   Electronically Signed   By: SSuzy BouchardM.D.   On: 06/02/2014 13:24   Mr Breast Bilateral W Wo Contrast  05/20/2014   CLINICAL DATA:  Patient with recent diagnosis right breast invasive ductal carcinoma.  EXAM: BILATERAL BREAST MRI WITH AND WITHOUT CONTRAST  TECHNIQUE: Multiplanar, multisequence MR images of both breasts were obtained prior to and following the intravenous administration of 17 ml of MultiHance.  THREE-DIMENSIONAL MR IMAGE RENDERING ON INDEPENDENT WORKSTATION:  Three-dimensional MR images were rendered by post-processing of the original MR data on an independent workstation. The three-dimensional MR images were interpreted, and findings are reported in the following complete MRI report for this study. Three dimensional images were evaluated at the independent DynaCad workstation  COMPARISON:  Previous exam(s).  FINDINGS: Breast composition: c.  Heterogeneous fibroglandular tissue.  Background parenchymal enhancement: Moderate.  Right breast: Within upper-outer right breast middle to posterior depth there is a 2.7 x  2.7 x 3.3 cm heterogeneously enhancing mass compatible with recently biopsied right breast carcinoma. Extending anterior and lateral to the mass is approximately 3 cm of non mass enhancement. This may potentially represent post biopsy changes and/or associated disease.  Within the upper inner right breast anterior depth there is an 8 x 8 x 8 mm irregular enhancing mass.  Within the lower inner right breast middle depth there is a 6 x 6 x 6 mm oval enhancing mass.  Left breast: Scattered foci of enhancement are demonstrated. No suspicious enhancing mass.  Lymph  nodes: There are multiple cortically thickened right axillary lymph nodes. The most cortically thickened lymph node measures 1.2 cm.  Ancillary findings: Multiple T2 bright masses are demonstrated throughout the liver measuring up to 10 mm.  IMPRESSION: 1. Irregular enhancing mass within the upper-outer right breast compatible with biopsy-proven carcinoma. Surrounding non mass enhancement potentially secondary to biopsy changes or associated disease. 2. Indeterminate 8 mm irregular enhancing mass within the upper inner right breast. 3. Indeterminate enhancing mass within the lower inner right breast. 4. Multiple cortically thickened right axillary lymph nodes. A previous right axillary lymph node biopsy has been performed. 5. Multiple nonspecific T2 bright masses within the liver. These may potentially represent cysts however are indeterminate on non dedicated imaging.  RECOMMENDATION: 1. MRI guided core needle biopsy of the enhancing masses within the upper inner and lower inner right breast. 2. Consider dedicated evaluation of the liver with pre and post contrast enhanced MRI.  BI-RADS CATEGORY  4: Suspicious.   Electronically Signed   By: Lovey Newcomer M.D.   On: 05/20/2014 16:08   Mm Digital Diagnostic Unilat R  05/18/2014   ADDENDUM REPORT: 05/18/2014 13:08  ADDENDUM: Pathology results: Pathology results from the ultrasound-guided biopsy of the mass in the upper-outer right breast at 10 o'clock revealed grade 3 invasive ductal carcinoma. Ultrasound-guided biopsy of the lymph node in the low right axilla was benign. This is concordant with the imaging findings. The patient has been notified of the results. She is doing well and denies any biopsy site complications.  She has an appointment with Dr. Georgette Dover 05/22/2014 at 10:45 a.m. A breast MRI is scheduled for 05/20/2014 at 8:45 a.m. The patient has been instructed to call the [facility] with any questions or concerns.   Electronically Signed   By: Everlean Alstrom M.D.   On: 05/18/2014 13:08   05/18/2014   CLINICAL DATA:  Post ultrasound-guided biopsy of a suspicious right breast mass as well as of an indeterminate right axillary lymph node.  EXAM: DIAGNOSTIC RIGHT MAMMOGRAM POST ULTRASOUND BIOPSY  COMPARISON:  Previous exam(s).  FINDINGS: Mammographic images were obtained following ultrasound guided biopsy of the suspicious mass in the upper-outer right breast at 10 o'clock. A ribbon shaped biopsy marking clip is present in the targeted region of the right breast mass.  IMPRESSION: Appropriate positioning of ribbon shaped biopsy marking clip post ultrasound-guided biopsy of a suspicious mass in the upper-outer right breast at 10 o'clock.  Final Assessment: Post Procedure Mammograms for Marker Placement  Electronically Signed: By: Everlean Alstrom M.D. On: 05/15/2014 10:23   US Breast Ltd Uni Right Inc Axilla  05/15/2014   CLINICAL DATA:  54 year old female with a palpable abnormality in the upper-outer right breast.  EXAM: DIGITAL DIAGNOSTIC RIGHT MAMMOGRAM WITH 3D TOMOSYNTHESIS AND CAD  RIGHT BREAST ULTRASOUND  COMPARISON:  Previous exams.  ACR Breast Density Category c: The breast tissue is heterogeneously dense, which may obscure small  masses.  FINDINGS: A spot compression tangential view was performed over the palpable site of concern in the upper-outer right breast with a possible mass/ asymmetry seen measuring approximately 2.5 cm, best seen on the tomosynthesis MLO view.  Mammographic images were processed with CAD.  Physical examination of the upper-outer right breast at site of palpable concern reveals a firm mass at the approximate 10 o'clock position.  Targeted ultrasound of the right breast was performed demonstrating an irregular hypoechoic mass at 10 o'clock 8 cm from the nipple measuring 2.6 x 1.4 x 2 cm. This corresponds with the site of palpable concern in the mammographic abnormality. A lymph node with a mildly thickened cortex is seen in the low  right axilla measuring 1.3 x 0.9 x 1 cm.  IMPRESSION: 1. Suspicious right breast mass.  2. Indeterminate right axillary lymph node.  RECOMMENDATION: Ultrasound-guided biopsy of the suspicious mass in the upper-outer right breast as well as of the indeterminate right axillary lymph node is recommended. This will subsequently be performed and dictated separately.  I have discussed the findings and recommendations with the patient. Results were also provided in writing at the conclusion of the visit. If applicable, a reminder letter will be sent to the patient regarding the next appointment.  BI-RADS CATEGORY  4: Suspicious.   Electronically Signed   By: Everlean Alstrom M.D.   On: 05/15/2014 09:10   Mm Diag Breast Tomo Uni Left  05/20/2014   CLINICAL DATA:  Patient with recent diagnosis invasive ductal carcinoma right breast. For mammographic evaluation of the left breast prior to MRI.  EXAM: DIGITAL DIAGNOSTIC LEFT MAMMOGRAM WITH 3D TOMOSYNTHESIS AND CAD  COMPARISON:  [facility]  ACR Breast Density Category c: The breast tissue is heterogeneously dense, which may obscure small masses.  FINDINGS: No concerning masses, calcifications or architectural distortion identified within the left breast.  Mammographic images were processed with CAD.  IMPRESSION: No mammographic evidence for malignancy.  RECOMMENDATION: Treatment plan for known right breast malignancy.  I have discussed the findings and recommendations with the patient. Results were also provided in writing at the conclusion of the visit. If applicable, a reminder letter will be sent to the patient regarding the next appointment.  BI-RADS CATEGORY  1: Negative.   Electronically Signed   By: Lovey Newcomer M.D.   On: 05/20/2014 09:00   Mm Diag Breast Tomo Uni Right  05/15/2014   CLINICAL DATA:  54 year old female with a palpable abnormality in the upper-outer right breast.  EXAM: DIGITAL DIAGNOSTIC RIGHT MAMMOGRAM WITH 3D TOMOSYNTHESIS AND CAD  RIGHT BREAST  ULTRASOUND  COMPARISON:  Previous exams.  ACR Breast Density Category c: The breast tissue is heterogeneously dense, which may obscure small masses.  FINDINGS: A spot compression tangential view was performed over the palpable site of concern in the upper-outer right breast with a possible mass/ asymmetry seen measuring approximately 2.5 cm, best seen on the tomosynthesis MLO view.  Mammographic images were processed with CAD.  Physical examination of the upper-outer right breast at site of palpable concern reveals a firm mass at the approximate 10 o'clock position.  Targeted ultrasound of the right breast was performed demonstrating an irregular hypoechoic mass at 10 o'clock 8 cm from the nipple measuring 2.6 x 1.4 x 2 cm. This corresponds with the site of palpable concern in the mammographic abnormality. A lymph node with a mildly thickened cortex is seen in the low right axilla measuring 1.3 x 0.9 x 1 cm.  IMPRESSION: 1. Suspicious  right breast mass.  2. Indeterminate right axillary lymph node.  RECOMMENDATION: Ultrasound-guided biopsy of the suspicious mass in the upper-outer right breast as well as of the indeterminate right axillary lymph node is recommended. This will subsequently be performed and dictated separately.  I have discussed the findings and recommendations with the patient. Results were also provided in writing at the conclusion of the visit. If applicable, a reminder letter will be sent to the patient regarding the next appointment.  BI-RADS CATEGORY  4: Suspicious.   Electronically Signed   By: Everlean Alstrom M.D.   On: 05/15/2014 09:10   [place] Rt Breast Bx W Loc Dev 1st Lesion Img Bx Spec US Guide  05/15/2014   CLINICAL DATA:  Suspicious mass in the upper-outer right breast.  EXAM: ULTRASOUND GUIDED RIGHT BREAST CORE NEEDLE BIOPSY  COMPARISON:  Previous exam(s).  PROCEDURE: I met with the patient and we discussed the procedure of ultrasound-guided biopsy, including benefits and alternatives.  We discussed the high likelihood of a successful procedure. We discussed the risks of the procedure including infection, bleeding, tissue injury, clip migration, and inadequate sampling. Informed written consent was given. The usual time-out protocol was performed immediately prior to the procedure.  Using sterile technique and 2% Lidocaine as local anesthetic, under direct ultrasound visualization, a 12 gauge vacuum-assisted device was used to perform biopsy of the suspicious mass in the upper-outer right breast at 10 o'clockusing a lateral to medial approach. At the conclusion of the procedure, a ribbon tissue marker clip was deployed into the biopsy cavity. Follow-up 2-view mammogram was performed and dictated separately.  Using sterile technique and 2% Lidocaine as local anesthetic, under direct ultrasound visualization, a 12 gauge vacuum-assisted device was used to perform biopsy of a lymph node with a mildly thickened cortex in the lower right axillausing a lateral to medial approach.  IMPRESSION: 1. Ultrasound-guided biopsy of the suspicious mass in the upper-outer right breast at 10 o'clock. No apparent complications.  2. Ultrasound-guided biopsy of an indeterminate lymph node in the low right axilla.   Electronically Signed   By: Everlean Alstrom M.D.   On: 05/15/2014 09:45   [place] Rt Breast Bx W Loc Dev Ea Add Lesion Img Bx Spec US Guide  05/15/2014   CLINICAL DATA:  Suspicious mass in the upper-outer right breast.  EXAM: ULTRASOUND GUIDED RIGHT BREAST CORE NEEDLE BIOPSY  COMPARISON:  Previous exam(s).  PROCEDURE: I met with the patient and we discussed the procedure of ultrasound-guided biopsy, including benefits and alternatives. We discussed the high likelihood of a successful procedure. We discussed the risks of the procedure including infection, bleeding, tissue injury, clip migration, and inadequate sampling. Informed written consent was given. The usual time-out protocol was performed immediately  prior to the procedure.  Using sterile technique and 2% Lidocaine as local anesthetic, under direct ultrasound visualization, a 12 gauge vacuum-assisted device was used to perform biopsy of the suspicious mass in the upper-outer right breast at 10 o'clockusing a lateral to medial approach. At the conclusion of the procedure, a ribbon tissue marker clip was deployed into the biopsy cavity. Follow-up 2-view mammogram was performed and dictated separately.  Using sterile technique and 2% Lidocaine as local anesthetic, under direct ultrasound visualization, a 12 gauge vacuum-assisted device was used to perform biopsy of a lymph node with a mildly thickened cortex in the lower right axillausing a lateral to medial approach.  IMPRESSION: 1. Ultrasound-guided biopsy of the suspicious mass in the upper-outer right breast at 10  o'clock. No apparent complications.  2. Ultrasound-guided biopsy of an indeterminate lymph node in the low right axilla.   Electronically Signed   By: Everlean Alstrom M.D.   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   PLAN: Patient is preparing for mastectomy on June 1st.  Await post-operative pathology to determine if radiation treatment will be needed. We discussed in detail what conditions will warrant radiation treatment. The patient is at higher risk than average for possible conversion into radiation treatment, largely based on  multiple enlarged nodes present.  The patient is at this time not leaning towards immediate reconstruction.      I spent 60 minutes face to face with the patient and more than 50% of that time was spent in counseling and/or coordination of care.    ________________________________   Jodelle Gross, MD, PhD   **Disclaimer: This note was dictated with voice recognition software. Similar sounding words can inadvertently be transcribed and this note may contain transcription errors which may not have been corrected upon publication of note.**   This document serves as a record of services personally performed by Kyung Rudd, MD. It was created on his behalf by Derek Mound, a trained medical scribe. The creation of this record is based on the scribe's personal observations and the provider's statements to them. This document has been checked and approved by the attending provider.

## 2014-06-08 NOTE — Progress Notes (Signed)
Please see the Nurse Progress Note in the MD Initial Consult Encounter for this patient. 

## 2014-06-08 NOTE — Progress Notes (Signed)
Met with Misty Snyder and her support person, who is a very close friend, at chemo class this morning.  Introduced [facility] team/resources, answering friend's many questions about programs.  Encouraged pt and friend to explore activities together for support and meaning-making.  Provided [facility] information; pt not ready for referral at this time, but verbalized appreciation and consideration.  Provided pastoral presence, reflective listening, and encouragement to reach out any time.  Plan to follow up by phone or note, but please also page as needs arise.  Thank you.  [place], [place]

## 2014-06-08 NOTE — Progress Notes (Signed)
Location of Breast Cancer: Right Breast 10 o'clock position Upper Outer quadrant   Histology per Pathology Report: 05/15/14: Right Breast Biopsy=InvasiveDuctal carcinoma  Receptor Status: ER( neg 0%), PR (neg 0% ), Her2-neu ( Neg, Ki-67 90%)  Did patient present with symptoms (if so, please note symptoms) or was this found on screening mammography?: patient felt herself a mass right breast  Past/Anticipated interventions by surgeon, if any: Dr. Tsui   Past/Anticipated interventions by medical oncology, if any: Chemotherapy : Dr. Gudena 06/03/14 =recommendation genetic counseling,Liver MRI evaluation of the liver,06/02/14; ,Chemo education 06/08/14 @ 1000 am,  06/08/14@1000am, follow up Dr. Gudena 06/17/14  Lymphedema issues, if any: NO  Pain issues, if any: None  SAFETY ISSUES: NO  Prior radiation? NO  Pacemaker/ICD? NO  Possible current pregnancy? NO  Is the patient on methotrexate? NO  Current Complaints / other details: Single,menarache age 12 GXPO, Hormones oral Contraceptives x22 years,menopause age [age], Maternal grandmother cancer, maternal grandfather prostate cancer Non smoker, no alcohol no drug use,  Allergies: NKA   [place], Janice Bruner, RN 06/02/2014,10:59 AM            

## 2014-06-08 NOTE — Addendum Note (Signed)
Encounter addended by: Kyung Rudd, MD on: 06/08/2014  2:42 PM<BR>     Documentation filed: Follow-up Section, LOS Section

## 2014-06-09 ENCOUNTER — Encounter (HOSPITAL_COMMUNITY): Payer: Self-pay

## 2014-06-09 ENCOUNTER — Encounter (HOSPITAL_COMMUNITY)
Admission: RE | Admit: 2014-06-09 | Discharge: 2014-06-09 | Disposition: A | Discharge status: Home / Self Care | Payer: BC Managed Care – PPO | Source: Ambulatory Visit | Attending: Surgery | Admitting: Surgery

## 2014-06-09 DIAGNOSIS — C50911 Malignant neoplasm of unspecified site of right female breast: Secondary | ICD-10-CM | POA: Diagnosis present

## 2014-06-09 DIAGNOSIS — F329 Major depressive disorder, single episode, unspecified: Secondary | ICD-10-CM | POA: Diagnosis not present

## 2014-06-09 LAB — CBC
HEMATOCRIT: 38 % (ref 36.0–46.0)
Hemoglobin: 12.3 g/dL (ref 12.0–15.0)
MCH: 26.5 pg (ref 26.0–34.0)
MCHC: 32.4 g/dL (ref 30.0–36.0)
MCV: 81.9 fL (ref 78.0–100.0)
PLATELETS: 251 10*3/uL (ref 150–400)
RBC: 4.64 MIL/uL (ref 3.87–5.11)
RDW: 15.5 % (ref 11.5–15.5)
WBC: 4.2 10*3/uL (ref 4.0–10.5)

## 2014-06-09 NOTE — Pre-Procedure Instructions (Addendum)
[facility]  06/09/2014      [id number] - [place] [address] [place] [address] Phone: [phone] Fax: [phone]  [id number], [place] [address] [name] [address] Phone: [phone] Fax: [phone]    Your procedure is scheduled on 06/17/14  [facility] to [facility] short stay admitting at 800 A.M.  Call this number if you have problems the morning of surgery:  [phone]   Remember:  Do not eat food or drink liquids after midnight.  Take these medicines the morning of surgery with A SIP OF WATER celexa      STOP all herbel meds, nsaids (aleve,naproxen,advil,ibuprofen) 5 days prior to surgery starting 06/12/14 including aspirin, vitamins    Do not wear jewelry, make-up or nail polish.  Do not wear lotions, powders, or perfumes.  You may wear deodorant.  Do not shave 48 hours prior to surgery.  Men may shave face and neck.  Do not bring valuables to the [facility].  [facility] is not responsible for any belongings or valuables.  Contacts, dentures or bridgework may not be worn into surgery.  Leave your suitcase in the car.  After surgery it may be brought to your room.  For patients admitted to the [facility], discharge time will be determined by your treatment team.  Patients discharged the day of surgery will not be allowed to drive home.   Name and phone number of your driver:    Special instructions:   Special Instructions: [place] - Preparing for Surgery  Before surgery, you can play an important role.  Because skin is not sterile, your skin needs to be as free of germs as possible.  You can reduce the number of germs on you skin by washing with CHG (chlorahexidine gluconate) soap before surgery.  CHG is an antiseptic cleaner which kills germs and bonds with the skin to continue killing germs even after washing.  Please DO NOT use if you have an allergy to CHG or  antibacterial soaps.  If your skin becomes reddened/irritated stop using the CHG and inform your nurse when you arrive at Short Stay.  Do not shave (including legs and underarms) for at least 48 hours prior to the first CHG shower.  You may shave your face.  Please follow these instructions carefully:   1.  Shower with CHG Soap the night before surgery and the morning of Surgery.  2.  If you choose to wash your hair, wash your hair first as usual with your normal shampoo.  3.  After you shampoo, rinse your hair and body thoroughly to remove the Shampoo.  4.  Use CHG as you would any other liquid soap.  You can apply chg directly  to the skin and wash gently with scrungie or a clean washcloth.  5.  Apply the CHG Soap to your body ONLY FROM THE NECK DOWN.  Do not use on open wounds or open sores.  Avoid contact with your eyes ears, mouth and genitals (private parts).  Wash genitals (private parts)       with your normal soap.  6.  Wash thoroughly, paying special attention to the area where your surgery will be performed.  7.  Thoroughly rinse your body with warm water from the neck down.  8.  DO NOT shower/wash with your normal soap after using and rinsing off the CHG Soap.  9.  Pat yourself  dry with a clean towel.            10.  Wear clean pajamas.            11.  Place clean sheets on your bed the night of your first shower and do not sleep with pets.  Day of Surgery  Do not apply any lotions/deodorants the morning of surgery.  Please wear clean clothes to the [facility]surgery center.  Please read over the following fact sheets that you were given. Pain Booklet, Coughing and Deep Breathing and Surgical Site Infection Prevention

## 2014-06-12 ENCOUNTER — Encounter: Payer: Self-pay | Admitting: General Practice

## 2014-06-12 ENCOUNTER — Encounter: Payer: Self-pay | Admitting: *Deleted

## 2014-06-12 NOTE — Progress Notes (Signed)
Spiritual Care Note  Met Lilas in chemo class on Monday.  Followed up by phone today to check in and offer further encouragement.  Saphira is determined and upbeat, proclaiming, "God'll bring me through this," and "I've told 'em to add me to the list of survivors."  She works full time and is due to complete a degree in criminal justice (already has a degree in speech) at [employer] in December.  She is working with her summer professor and a cousin in her class to keep up with lectures and assignments.  She has self-care plans through the weekend, including hosting her family for a meal.  Per Colletta [place], keeping busy helps her stay focused and positive.  Provided reflective listening, highlighting her strengths, and reminded her of ongoing chaplain availability for additional support.  She verbalized appreciation for the call and thoughtfulness, noting that "everyone [at [facility]] has been so friendly and encouraging through this."  [address], [place]

## 2014-06-12 NOTE — Progress Notes (Signed)
[place] Psychosocial Distress Screening Clinical Social Work  Clinical Social Work was referred by distress screening protocol.  The patient scored a 7 on the Psychosocial Distress Thermometer which indicates severe distress. Clinical Social Worker phoned pt to assess for distress and other psychosocial needs. CSW left message introducing Support Team and availability for support, resources and additional assistance as needed. CSW provided number for return call as well. Pt has connected with Chaplain for additional support as well. CSW to be available as needed at future appointments.   ON[facility] DISTRESS SCREENING 06/08/2014  Screening Type Initial Screening  Distress experienced in past week (1-10) 7  Practical problem type Insurance  Emotional problem type Nervousness/Anxiety;Adjusting to illness;Adjusting to appearance changes  Information Concerns Type Lack of info about diagnosis  Physician notified of physical symptoms Yes  Referral to clinical social work Yes     Clinical Social Worker follow up needed: Yes.    If yes, follow up plan: See above Loren Racer, [place] Worker [place]  [facility] Phone: [phone] Fax: [phone]

## 2014-06-16 MED ORDER — CEFAZOLIN SODIUM-DEXTROSE 2-3 GM-% IV SOLR
2.0000 g | INTRAVENOUS | Status: DC
  Filled 2014-06-16: qty 50

## 2014-06-17 ENCOUNTER — Encounter (HOSPITAL_COMMUNITY)
Admission: RE | Admit: 2014-06-17 | Discharge: 2014-06-17 | Disposition: A | Discharge status: Home / Self Care | Payer: BC Managed Care – PPO | Source: Ambulatory Visit | Attending: Surgery | Admitting: Surgery

## 2014-06-17 ENCOUNTER — Encounter (HOSPITAL_COMMUNITY): Payer: Self-pay | Admitting: *Deleted

## 2014-06-17 ENCOUNTER — Encounter (HOSPITAL_COMMUNITY)
Admission: RE | Disposition: A | Discharge status: Home / Self Care | Payer: Self-pay | Source: Ambulatory Visit | Attending: Surgery

## 2014-06-17 ENCOUNTER — Ambulatory Visit (HOSPITAL_COMMUNITY)
Admission: RE | Admit: 2014-06-17 | Discharge: 2014-06-19 | Disposition: A | Discharge status: Home / Self Care | Payer: BC Managed Care – PPO | Source: Ambulatory Visit | Attending: Surgery | Admitting: Surgery

## 2014-06-17 ENCOUNTER — Inpatient Hospital Stay (HOSPITAL_COMMUNITY): Payer: BC Managed Care – PPO | Admitting: Anesthesiology

## 2014-06-17 ENCOUNTER — Ambulatory Visit (HOSPITAL_COMMUNITY): Payer: BC Managed Care – PPO

## 2014-06-17 ENCOUNTER — Inpatient Hospital Stay (HOSPITAL_COMMUNITY): Payer: BC Managed Care – PPO

## 2014-06-17 DIAGNOSIS — F329 Major depressive disorder, single episode, unspecified: Secondary | ICD-10-CM | POA: Insufficient documentation

## 2014-06-17 DIAGNOSIS — C50919 Malignant neoplasm of unspecified site of unspecified female breast: Secondary | ICD-10-CM | POA: Diagnosis present

## 2014-06-17 DIAGNOSIS — Z452 Encounter for adjustment and management of vascular access device: Secondary | ICD-10-CM

## 2014-06-17 DIAGNOSIS — C50911 Malignant neoplasm of unspecified site of right female breast: Principal | ICD-10-CM | POA: Insufficient documentation

## 2014-06-17 DIAGNOSIS — Z419 Encounter for procedure for purposes other than remedying health state, unspecified: Secondary | ICD-10-CM

## 2014-06-17 HISTORY — PX: MASTECTOMY W/ SENTINEL NODE BIOPSY: SHX2001

## 2014-06-17 HISTORY — PX: PORTACATH PLACEMENT: SHX2246

## 2014-06-17 LAB — CBC
HEMATOCRIT: 38.3 % (ref 36.0–46.0)
Hemoglobin: 12.4 g/dL (ref 12.0–15.0)
MCH: 26.7 pg (ref 26.0–34.0)
MCHC: 32.4 g/dL (ref 30.0–36.0)
MCV: 82.5 fL (ref 78.0–100.0)
Platelets: 241 10*3/uL (ref 150–400)
RBC: 4.64 MIL/uL (ref 3.87–5.11)
RDW: 15.6 % — ABNORMAL HIGH (ref 11.5–15.5)
WBC: 8.1 10*3/uL (ref 4.0–10.5)

## 2014-06-17 LAB — CREATININE, SERUM
Creatinine, Ser: 0.68 mg/dL (ref 0.44–1.00)
GFR calc non Af Amer: >60 mL/min (ref >60)

## 2014-06-17 SURGERY — MASTECTOMY WITH SENTINEL LYMPH NODE BIOPSY
Anesthesia: Regional | Site: Chest | Laterality: Right

## 2014-06-17 MED ORDER — HEPARIN SOD (PORK) LOCK FLUSH 100 UNIT/ML IV SOLN
INTRAVENOUS | Status: DC | PRN
  Administered 2014-06-17: 500 [IU]

## 2014-06-17 MED ORDER — FENTANYL CITRATE (PF) 100 MCG/2ML IJ SOLN
INTRAMUSCULAR | Status: DC | PRN
  Administered 2014-06-17: 50 ug via INTRAVENOUS
  Administered 2014-06-17 (×6): 25 ug via INTRAVENOUS
  Administered 2014-06-17: 50 ug via INTRAVENOUS

## 2014-06-17 MED ORDER — DEXAMETHASONE SODIUM PHOSPHATE 4 MG/ML IJ SOLN
INTRAMUSCULAR | Status: DC | PRN
  Administered 2014-06-17: 4 mg via INTRAVENOUS

## 2014-06-17 MED ORDER — HEPARIN SOD (PORK) LOCK FLUSH 100 UNIT/ML IV SOLN
INTRAVENOUS | Status: AC
  Filled 2014-06-17: qty 5

## 2014-06-17 MED ORDER — ONDANSETRON HCL 4 MG/2ML IJ SOLN
4.0000 mg | Freq: Four times a day (QID) | INTRAMUSCULAR | Status: DC | PRN
  Administered 2014-06-17 – 2014-06-18 (×3): 4 mg via INTRAVENOUS
  Filled 2014-06-17 (×2): qty 2

## 2014-06-17 MED ORDER — OXYCODONE-ACETAMINOPHEN 5-325 MG PO TABS
ORAL_TABLET | ORAL | Status: AC
  Administered 2014-06-17: 2 via ORAL
  Filled 2014-06-17: qty 2

## 2014-06-17 MED ORDER — PHENYLEPHRINE HCL 10 MG/ML IJ SOLN
INTRAMUSCULAR | Status: DC | PRN
  Administered 2014-06-17: 40 ug via INTRAVENOUS
  Administered 2014-06-17 (×3): 80 ug via INTRAVENOUS
  Administered 2014-06-17: 40 ug via INTRAVENOUS
  Administered 2014-06-17 (×2): 80 ug via INTRAVENOUS
  Administered 2014-06-17 (×2): 40 ug via INTRAVENOUS

## 2014-06-17 MED ORDER — OXYCODONE-ACETAMINOPHEN 5-325 MG PO TABS
1.0000 | ORAL_TABLET | ORAL | Status: DC | PRN
  Administered 2014-06-17 – 2014-06-18 (×3): 2 via ORAL
  Filled 2014-06-17 (×3): qty 2

## 2014-06-17 MED ORDER — HYDROMORPHONE HCL 1 MG/ML IJ SOLN
INTRAMUSCULAR | Status: AC
  Administered 2014-06-17: 0.5 mg via INTRAVENOUS
  Filled 2014-06-17: qty 1

## 2014-06-17 MED ORDER — MIDAZOLAM HCL 2 MG/2ML IJ SOLN
1.0000 mg | INTRAMUSCULAR | Status: DC | PRN
  Administered 2014-06-17 (×2): 2 mg via INTRAVENOUS

## 2014-06-17 MED ORDER — MIDAZOLAM HCL 2 MG/2ML IJ SOLN
INTRAMUSCULAR | Status: AC
  Filled 2014-06-17: qty 2

## 2014-06-17 MED ORDER — AMPHETAMINE-DEXTROAMPHETAMINE 10 MG PO TABS
30.0000 mg | ORAL_TABLET | Freq: Every day | ORAL | Status: DC
  Filled 2014-06-17 (×2): qty 3

## 2014-06-17 MED ORDER — KETOROLAC TROMETHAMINE 30 MG/ML IJ SOLN
INTRAMUSCULAR | Status: AC
  Administered 2014-06-17: 30 mg via INTRAVENOUS
  Filled 2014-06-17: qty 1

## 2014-06-17 MED ORDER — ONDANSETRON HCL 4 MG/2ML IJ SOLN: 4.0000 mg | Freq: Four times a day (QID) | INTRAMUSCULAR | Status: DC | PRN

## 2014-06-17 MED ORDER — PHENYLEPHRINE 40 MCG/ML (10ML) SYRINGE FOR IV PUSH (FOR BLOOD PRESSURE SUPPORT)
PREFILLED_SYRINGE | INTRAVENOUS | Status: AC
  Filled 2014-06-17: qty 10

## 2014-06-17 MED ORDER — ONDANSETRON HCL 4 MG/2ML IJ SOLN
INTRAMUSCULAR | Status: DC | PRN
  Administered 2014-06-17: 4 mg via INTRAVENOUS

## 2014-06-17 MED ORDER — FENTANYL CITRATE (PF) 100 MCG/2ML IJ SOLN
INTRAMUSCULAR | Status: AC
  Administered 2014-06-17: 50 ug via INTRAVENOUS
  Filled 2014-06-17: qty 2

## 2014-06-17 MED ORDER — ENOXAPARIN SODIUM 40 MG/0.4ML ~~LOC~~ SOLN
40.0000 mg | SUBCUTANEOUS | Status: DC
  Administered 2014-06-18: 40 mg via SUBCUTANEOUS
  Filled 2014-06-17: qty 0.4

## 2014-06-17 MED ORDER — BUPIVACAINE-EPINEPHRINE (PF) 0.5% -1:200000 IJ SOLN
INTRAMUSCULAR | Status: DC | PRN
  Administered 2014-06-17: 30 mL via PERINEURAL

## 2014-06-17 MED ORDER — CHLORHEXIDINE GLUCONATE 4 % EX LIQD: 1.0000 "application " | Freq: Once | CUTANEOUS | Status: DC

## 2014-06-17 MED ORDER — ONDANSETRON HCL 4 MG PO TABS: 4.0000 mg | ORAL_TABLET | Freq: Four times a day (QID) | ORAL | Status: DC | PRN

## 2014-06-17 MED ORDER — HYDROMORPHONE HCL 1 MG/ML IJ SOLN
1.0000 mg | INTRAMUSCULAR | Status: DC | PRN
  Administered 2014-06-17 – 2014-06-18 (×4): 1 mg via INTRAVENOUS
  Filled 2014-06-17 (×4): qty 1

## 2014-06-17 MED ORDER — TECHNETIUM TC 99M SULFUR COLLOID FILTERED: 1.0000 | Freq: Once | INTRAVENOUS | Status: AC | PRN

## 2014-06-17 MED ORDER — BUPIVACAINE-EPINEPHRINE 0.25% -1:200000 IJ SOLN
INTRAMUSCULAR | Status: DC | PRN
  Administered 2014-06-17: 30 mL

## 2014-06-17 MED ORDER — PROPOFOL 10 MG/ML IV BOLUS
INTRAVENOUS | Status: DC | PRN
  Administered 2014-06-17: 190 mg via INTRAVENOUS

## 2014-06-17 MED ORDER — SODIUM CHLORIDE 0.9 % IR SOLN
Status: DC | PRN
  Administered 2014-06-17: 10:00:00

## 2014-06-17 MED ORDER — LACTATED RINGERS IV SOLN
INTRAVENOUS | Status: DC
  Administered 2014-06-17 (×3): via INTRAVENOUS

## 2014-06-17 MED ORDER — POTASSIUM CHLORIDE IN NACL 20-0.9 MEQ/L-% IV SOLN
INTRAVENOUS | Status: DC
  Administered 2014-06-17 – 2014-06-19 (×3): via INTRAVENOUS
  Filled 2014-06-17 (×5): qty 1000

## 2014-06-17 MED ORDER — BUPIVACAINE-EPINEPHRINE (PF) 0.25% -1:200000 IJ SOLN
INTRAMUSCULAR | Status: AC
  Filled 2014-06-17: qty 30

## 2014-06-17 MED ORDER — CEFAZOLIN SODIUM-DEXTROSE 2-3 GM-% IV SOLR
INTRAVENOUS | Status: DC | PRN
  Administered 2014-06-17: 2 g via INTRAVENOUS

## 2014-06-17 MED ORDER — OXYCODONE HCL 5 MG/5ML PO SOLN: 5.0000 mg | Freq: Once | ORAL | Status: DC | PRN

## 2014-06-17 MED ORDER — MIDAZOLAM HCL 2 MG/2ML IJ SOLN
INTRAMUSCULAR | Status: AC
Start: 2014-06-17 — End: 2014-06-17
  Administered 2014-06-17: 2 mg via INTRAVENOUS
  Filled 2014-06-17: qty 2

## 2014-06-17 MED ORDER — PROPOFOL 10 MG/ML IV BOLUS
INTRAVENOUS | Status: AC
  Filled 2014-06-17: qty 20

## 2014-06-17 MED ORDER — KETOROLAC TROMETHAMINE 30 MG/ML IJ SOLN
30.0000 mg | Freq: Four times a day (QID) | INTRAMUSCULAR | Status: DC
  Administered 2014-06-17 – 2014-06-19 (×8): 30 mg via INTRAVENOUS
  Filled 2014-06-17 (×7): qty 1

## 2014-06-17 MED ORDER — AMPHETAMINE-DEXTROAMPHETAMINE 30 MG PO TABS: 30.0000 mg | ORAL_TABLET | Freq: Every day | ORAL | Status: DC

## 2014-06-17 MED ORDER — HYDROMORPHONE HCL 1 MG/ML IJ SOLN
0.2500 mg | INTRAMUSCULAR | Status: DC | PRN
  Administered 2014-06-17 (×3): 0.5 mg via INTRAVENOUS

## 2014-06-17 MED ORDER — LIDOCAINE HCL (CARDIAC) 20 MG/ML IV SOLN
INTRAVENOUS | Status: DC | PRN
  Administered 2014-06-17: 60 mg via INTRAVENOUS

## 2014-06-17 MED ORDER — FENTANYL CITRATE (PF) 100 MCG/2ML IJ SOLN
50.0000 ug | INTRAMUSCULAR | Status: DC | PRN
  Administered 2014-06-17: 50 ug via INTRAVENOUS

## 2014-06-17 MED ORDER — OXYCODONE HCL 5 MG PO TABS: 5.0000 mg | ORAL_TABLET | Freq: Once | ORAL | Status: DC | PRN

## 2014-06-17 MED ORDER — ONDANSETRON HCL 4 MG/2ML IJ SOLN
INTRAMUSCULAR | Status: AC
  Administered 2014-06-17: 4 mg via INTRAVENOUS
  Filled 2014-06-17: qty 2

## 2014-06-17 MED ORDER — METHYLENE BLUE 1 % INJ SOLN
INTRAMUSCULAR | Status: AC
  Filled 2014-06-17: qty 10

## 2014-06-17 MED ORDER — FENTANYL CITRATE (PF) 250 MCG/5ML IJ SOLN
INTRAMUSCULAR | Status: AC
  Filled 2014-06-17: qty 5

## 2014-06-17 MED ORDER — HYDROXYZINE HCL 25 MG PO TABS: 50.0000 mg | ORAL_TABLET | Freq: Three times a day (TID) | ORAL | Status: DC | PRN

## 2014-06-17 MED ORDER — CEFAZOLIN SODIUM 1-5 GM-% IV SOLN
1.0000 g | Freq: Four times a day (QID) | INTRAVENOUS | Status: AC
  Administered 2014-06-17 – 2014-06-18 (×3): 1 g via INTRAVENOUS
  Filled 2014-06-17 (×3): qty 50

## 2014-06-17 MED ORDER — 0.9 % SODIUM CHLORIDE (POUR BTL) OPTIME
TOPICAL | Status: DC | PRN
  Administered 2014-06-17: 1000 mL

## 2014-06-17 MED ORDER — SODIUM CHLORIDE 0.9 % IJ SOLN
INTRAMUSCULAR | Status: DC | PRN
  Administered 2014-06-17: 10:00:00 via INTRAMUSCULAR

## 2014-06-17 MED ORDER — CITALOPRAM HYDROBROMIDE 20 MG PO TABS
40.0000 mg | ORAL_TABLET | Freq: Every day | ORAL | Status: DC
  Filled 2014-06-17 (×2): qty 2

## 2014-06-17 SURGICAL SUPPLY — 84 items
APL SKNCLS STERI-STRIP NONHPOA (GAUZE/BANDAGES/DRESSINGS) ×2
APPLIER CLIP 9.375 MED OPEN (MISCELLANEOUS)
APR CLP MED 9.3 20 MLT OPN (MISCELLANEOUS)
BAG DECANTER FOR FLEXI CONT (MISCELLANEOUS) ×4 IMPLANT
BENZOIN TINCTURE PRP APPL 2/3 (GAUZE/BANDAGES/DRESSINGS) ×4 IMPLANT
BINDER BREAST LRG (GAUZE/BANDAGES/DRESSINGS) IMPLANT
BINDER BREAST XLRG (GAUZE/BANDAGES/DRESSINGS) ×2 IMPLANT
BLADE SURG 11 STRL SS (BLADE) ×4 IMPLANT
BLADE SURG 15 STRL LF DISP TIS (BLADE) ×2 IMPLANT
BLADE SURG 15 STRL SS (BLADE) ×4
CANISTER SUCTION 2500CC (MISCELLANEOUS) ×4 IMPLANT
CHLORAPREP W/TINT 10.5 ML (MISCELLANEOUS) ×4 IMPLANT
CHLORAPREP W/TINT 26ML (MISCELLANEOUS) ×4 IMPLANT
CLIP APPLIE 9.375 MED OPEN (MISCELLANEOUS) IMPLANT
CLOSURE WOUND 1/2 X4 (GAUZE/BANDAGES/DRESSINGS) ×1
CONT SPEC 4OZ CLIKSEAL STRL BL (MISCELLANEOUS) ×6 IMPLANT
COVER PROBE W GEL 5X96 (DRAPES) ×4 IMPLANT
COVER SURGICAL LIGHT HANDLE (MISCELLANEOUS) ×4 IMPLANT
COVER TRANSDUCER ULTRASND GEL (DRAPE) IMPLANT
CRADLE DONUT ADULT HEAD (MISCELLANEOUS) ×4 IMPLANT
DRAIN CHANNEL 19F RND (DRAIN) ×4 IMPLANT
DRAPE C-ARM 42X72 X-RAY (DRAPES) ×4 IMPLANT
DRAPE LAPAROSCOPIC ABDOMINAL (DRAPES) ×6 IMPLANT
DRAPE UTILITY XL STRL (DRAPES) ×8 IMPLANT
DRSG PAD ABDOMINAL 8X10 ST (GAUZE/BANDAGES/DRESSINGS) ×6 IMPLANT
DRSG TEGADERM 2-3/8X2-3/4 SM (GAUZE/BANDAGES/DRESSINGS) ×2 IMPLANT
DRSG TEGADERM 4X4.75 (GAUZE/BANDAGES/DRESSINGS) ×4 IMPLANT
ELECT BLADE 4.0 EZ CLEAN MEGAD (MISCELLANEOUS) ×4
ELECT CAUTERY BLADE 6.4 (BLADE) ×4 IMPLANT
ELECT REM PT RETURN 9FT ADLT (ELECTROSURGICAL) ×4
ELECTRODE BLDE 4.0 EZ CLN MEGD (MISCELLANEOUS) ×2 IMPLANT
ELECTRODE REM PT RTRN 9FT ADLT (ELECTROSURGICAL) ×2 IMPLANT
EVACUATOR SILICONE 100CC (DRAIN) ×4 IMPLANT
GAUZE SPONGE 2X2 8PLY STRL LF (GAUZE/BANDAGES/DRESSINGS) ×2 IMPLANT
GAUZE SPONGE 4X4 12PLY STRL (GAUZE/BANDAGES/DRESSINGS) ×4 IMPLANT
GAUZE SPONGE 4X4 16PLY XRAY LF (GAUZE/BANDAGES/DRESSINGS) ×4 IMPLANT
GAUZE XEROFORM 5X9 LF (GAUZE/BANDAGES/DRESSINGS) ×2 IMPLANT
GEL ULTRASOUND 20GR AQUASONIC (MISCELLANEOUS) IMPLANT
GLOVE BIO SURGEON STRL SZ7 (GLOVE) ×8 IMPLANT
GLOVE BIOGEL PI IND STRL 6 (GLOVE) IMPLANT
GLOVE BIOGEL PI IND STRL 6.5 (GLOVE) IMPLANT
GLOVE BIOGEL PI IND STRL 7.5 (GLOVE) ×2 IMPLANT
GLOVE BIOGEL PI IND STRL 8 (GLOVE) IMPLANT
GLOVE BIOGEL PI INDICATOR 6 (GLOVE) ×2
GLOVE BIOGEL PI INDICATOR 6.5 (GLOVE) ×2
GLOVE BIOGEL PI INDICATOR 7.5 (GLOVE) ×4
GLOVE BIOGEL PI INDICATOR 8 (GLOVE) ×6
GOWN STRL REUS W/ TWL LRG LVL3 (GOWN DISPOSABLE) ×4 IMPLANT
GOWN STRL REUS W/TWL LRG LVL3 (GOWN DISPOSABLE) ×16
INTRODUCER COOK 11FR (CATHETERS) IMPLANT
KIT BASIN OR (CUSTOM PROCEDURE TRAY) ×4 IMPLANT
KIT PORT POWER 8FR ISP CVUE (Catheter) ×2 IMPLANT
KIT PORT POWER 9.6FR MRI PREA (Catheter) IMPLANT
KIT ROOM TURNOVER OR (KITS) ×4 IMPLANT
NDL 18GX1X1/2 (RX/OR ONLY) (NEEDLE) ×2 IMPLANT
NDL HYPO 25GX1X1/2 BEV (NEEDLE) ×2 IMPLANT
NEEDLE 18GX1X1/2 (RX/OR ONLY) (NEEDLE) ×4 IMPLANT
NEEDLE HYPO 25GX1X1/2 BEV (NEEDLE) ×8 IMPLANT
NS IRRIG 1000ML POUR BTL (IV SOLUTION) ×4 IMPLANT
PACK GENERAL/GYN (CUSTOM PROCEDURE TRAY) ×2 IMPLANT
PACK SURGICAL SETUP 50X90 (CUSTOM PROCEDURE TRAY) ×4 IMPLANT
PAD ABD 8X10 STRL (GAUZE/BANDAGES/DRESSINGS) ×4 IMPLANT
PAD ARMBOARD 7.5X6 YLW CONV (MISCELLANEOUS) ×4 IMPLANT
PENCIL BUTTON HOLSTER BLD 10FT (ELECTRODE) ×4 IMPLANT
SET INTRODUCER 12FR PACEMAKER (SHEATH) IMPLANT
SET SHEATH INTRODUCER 10FR (MISCELLANEOUS) IMPLANT
SHEATH COOK PEEL AWAY SET 9F (SHEATH) IMPLANT
SPECIMEN JAR X LARGE (MISCELLANEOUS) ×4 IMPLANT
SPONGE GAUZE 2X2 STER 10/PKG (GAUZE/BANDAGES/DRESSINGS) ×2
SPONGE GAUZE 4X4 12PLY STER LF (GAUZE/BANDAGES/DRESSINGS) ×2 IMPLANT
SPONGE LAP 18X18 X RAY DECT (DISPOSABLE) ×2 IMPLANT
STAPLER VISISTAT 35W (STAPLE) ×4 IMPLANT
STRIP CLOSURE SKIN 1/2X4 (GAUZE/BANDAGES/DRESSINGS) ×3 IMPLANT
SUT ETHILON 2 0 FS 18 (SUTURE) ×4 IMPLANT
SUT MNCRL AB 4-0 PS2 18 (SUTURE) ×4 IMPLANT
SUT PROLENE 2 0 SH DA (SUTURE) ×4 IMPLANT
SUT VIC AB 3-0 SH 18 (SUTURE) ×4 IMPLANT
SUT VIC AB 3-0 SH 27 (SUTURE) ×4
SUT VIC AB 3-0 SH 27X BRD (SUTURE) ×2 IMPLANT
SYR 20ML ECCENTRIC (SYRINGE) ×8 IMPLANT
SYR 5ML LUER SLIP (SYRINGE) ×4 IMPLANT
SYR CONTROL 10ML LL (SYRINGE) ×6 IMPLANT
TOWEL OR 17X24 6PK STRL BLUE (TOWEL DISPOSABLE) ×4 IMPLANT
TOWEL OR 17X26 10 PK STRL BLUE (TOWEL DISPOSABLE) ×4 IMPLANT

## 2014-06-17 NOTE — Anesthesia Procedure Notes (Addendum)
Anesthesia Regional Block:  Pectoralis block  Pre-Anesthetic Checklist: ,, timeout performed, Correct Patient, Correct Site, Correct Laterality, Correct Procedure, Correct Position, site marked, Risks and benefits discussed,  Surgical consent,  Pre-op evaluation,  At surgeon's request and post-op pain management  Laterality: Right  Prep: chloraprep       Needles:  Injection technique: Single-shot  Needle Type: Echogenic Needle     Needle Length: 9cm 9 cm Needle Gauge: 21 and 21 G    Additional Needles: Pectoralis block Narrative:  Start time: 06/17/2014 9:14 AM End time: 06/17/2014 9:21 AM Injection made incrementally with aspirations every 5 mL.  Performed by: Personally  Anesthesiologist: HODIERNE, ADAM  Additional Notes: Pt tolerated the procedure well.   Procedure Name: LMA Insertion Date/Time: 06/17/2014 10:09 AM Performed by: Gershon Mussel, Makaley Storts Pre-anesthesia Checklist: Patient identified, Patient being monitored, Timeout performed, Emergency Drugs available and Suction available Patient Re-evaluated:Patient Re-evaluated prior to inductionOxygen Delivery Method: Circle System Utilized Preoxygenation: Pre-oxygenation with 100% oxygen Intubation Type: IV induction Ventilation: Mask ventilation without difficulty LMA Size: 4.0 Grade View: Grade I Tube type: Oral Tube size: 7.0 mm Number of attempts: 1 Airway Equipment and Method: Stylet Placement Confirmation: ETT inserted through vocal cords under direct vision,  positive ETCO2 and breath sounds checked- equal and bilateral Dental Injury: Teeth and Oropharynx as per pre-operative assessment

## 2014-06-17 NOTE — Op Note (Signed)
Preop diagnosis right breast cancer Postop diagnosis: Same Procedure performed: 1 left subclavian vein port placement #2 blue dye injection #3 right mastectomy with right axillary sentinel lymph node biopsy Surgeon:Donnica Jarnagin K. Anesthesia: Gen. endotracheal Indications: This is a 54 year old female who was recently diagnosed with right breast cancer. MRI showed that she had 2 additional areas that were suspicious in appearance. She had consultations with surgery as well as medical oncology and radiation oncology. She had other abnormal findings in her axilla as well as in her liver. Based on all these findings, she made the decision to proceed with right mastectomy without reconstruction as well as left port placement to begin chemotherapy. She presents now for surgery.  Description of procedure: The patient is brought to the operating room placed in a supine position on the operating room table. After an adequate level of general anesthesia was obtained the patient was positioned with her arms tucked at her sides with a roll behind her shoulders. Her right nipple was prepped with ChloraPrep and I instilled 5 mL of methylene blue solution intradermally around the nipple. Her left chest was prepped with ChloraPrep and draped sterile fashion. A timeout was taken to ensure the proper patient proper procedure. We infiltrated the area below the left clavicle with 0.25% Marcaine with epinephrine. An 18-gauge needle was used to cannulate the left subclavian vein. The wire was passed and fluoroscopy confirmed that the wire passed down the superior vena cava to the right atrium. The needle was removed. A subcutaneous pocket was created below the insertion site. An 8 French ClearView port was inserted and connected to the catheter. The catheter was tunneled up to the initial insertion site. The port was secured with 2 separate 2-0 Prolene sutures. The catheter was flushed with heparinized saline. We measured the  length of the catheter cut this to keep the tip of the catheter at the cavoatrial junction. The dilator and breakaway sheath were inserted over the wire under fluoroscopy. The wire and dilator were removed. The catheter was then advanced through the breakaway sheath which was then removed. Placement was confirmed by fluoroscopy. We also aspirated blood and flushed easily with heparinized saline. Concentrated heparin solution was instilled in the port. The wound was closed with a deep layer of 3-0 Vicryl and a subcuticular layer of 4-0 Monocryl. Benzoin Steri-Strips were applied. A clean dressing was applied.  We then took down all of our drapes and prepped her right chest with ChloraPrep and judged sterile fashion. Her arm was extended on an armboard. We performed another timeout. I outlined an elliptical incision around her nipple areole complex. We raised skin flaps after sizing with a #10 blade. We carried our dissection to the inframammary crease inferiorly, the edge of the sternum medially. The infraclavicular fossa superiorly and the anterior edge of the latissimus muscle laterally.  The axilla was interrogated with the neoprobe device. We identified 2 areas of activity. We have to locate 2 separate hot blue lymph nodes in the axillary tissue. These were sent for pathologic examination. No other activity was noted in the axilla. We then remove the entire breast from medial to lateral including the anterior pectoralis fascia with the specimen. The specimen was oriented with a suture in the apex towards the axilla. This was sent for pathologic examination along with the 2 lymph nodes. We irrigated the wound thoroughly and used cautery for hemostasis. A 19 [place] Blake drain was inserted through a stab incision and placed underneath skin flaps. The skin  was closed with interrupted 30 dermal sutures. Staples were used to close the skin. The drain was placed to bulb suction. A dry dressing was applied. The breast  binder was applied. The patient was then extubated and brought to recovery room in stable condition. All sponge, instrument, and needle counts are correct.  Imogene Burn. Georgette Dover, MD, [facility] Surgery  General/ Trauma Surgery  06/17/2014 12:34 PM

## 2014-06-17 NOTE — Anesthesia Postprocedure Evaluation (Signed)
Anesthesia Post Note  Patient: [facility]  Procedure(s) Performed: Procedure(s) (LRB): RIGHT MASTECTOMY WITH SENTINEL LYMPH NODE BIOPSY WITH BLUE DYE MAPPING AND PORT PLACEMENT (Right) INSERTION PORT-A-CATH (Left)  Anesthesia type: General  Patient location: PACU  Post pain: Pain level controlled and Adequate analgesia  Post assessment: Post-op Vital signs reviewed, Patient's Cardiovascular Status Stable, Respiratory Function Stable, Patent Airway and Pain level controlled  Last Vitals:  Filed Vitals:   06/17/14 1255  BP: 119/73  Pulse: 88  Temp:   Resp: 12    Post vital signs: Reviewed and stable  Level of consciousness: awake, alert  and oriented  Complications: No apparent anesthesia complications

## 2014-06-17 NOTE — H&P (View-Only) (Signed)
History of Present Illness Misty Snyder. Lynsee Wands MD; 05/23/2014 7:33 PM) The patient is a 54 year old female who presents with breast cancer. Referred by Dr. Stevie Kern for evaluation of right breast cancer. This is a 54 year old family in relatively good health who presents with a palpable mass in her right breast. She had a mammogram in June of 2015 that was read as normal. She felt a mass in her upper outer right breast about one month ago. No sign of inflammation or infection. She underwent diagnostic right mammogram and ultrasound on 05/15/14 that showed a firm mass at 10:00. This mass was irregular and hypoechoic, measuring 2.6 x 1.4 x 2 cm. A lymph node was seen in the low right axilla with mildly thickened cortex measuring 1.3 x 0.9 x 1.0 cm. She underwent ultrasound-guided biopsy of the mass and the lymph node on 05/15/14. 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 The lymph node biopsy was negative. Left breast tomo mammogram was negative. She underwent MRI of both breasts on 05/20/14 that confirmed the presence of the 2.7 x 2.7 x 3.3 cm mass that was shown to be invasive ductal carcinoma. However, anterior and lateral to the mass, there is approximately 3 cm of non-mass enhancement. In the upper inner right breast there is an 8 x 8 x 8 mm irregular enhancing masss. In the right lower inner breast, there is a 6 mm enhancing mass. These are scheduled to be biopsied next week. There are multiple cortically thickened right axillary lymph nodes. There are also multiple nonspecific enhancing masses within the liver.   CLINICAL DATA: 54 year old female with a palpable abnormality in the upper-outer right breast.  EXAM: DIGITAL DIAGNOSTIC RIGHT MAMMOGRAM WITH 3D TOMOSYNTHESIS AND CAD  RIGHT BREAST ULTRASOUND  COMPARISON: Previous exams.  ACR Breast Density Category c: The breast tissue is heterogeneously dense, which may obscure small  masses.  FINDINGS: A spot compression tangential view was performed over the palpable site of concern in the upper-outer right breast with a possible mass/ asymmetry seen measuring approximately 2.5 cm, best seen on the tomosynthesis MLO view.  Mammographic images were processed with CAD.  Physical examination of the upper-outer right breast at site of palpable concern reveals a firm mass at the approximate 10 o'clock position.  Targeted ultrasound of the right breast was performed demonstrating an irregular hypoechoic mass at 10 o'clock 8 cm from the nipple measuring 2.6 x 1.4 x 2 cm. This corresponds with the site of palpable concern in the mammographic abnormality. A lymph node with a mildly thickened cortex is seen in the low right axilla measuring 1.3 x 0.9 x 1 cm.  IMPRESSION: 1. Suspicious right breast mass.  2. Indeterminate right axillary lymph node.  RECOMMENDATION: Ultrasound-guided biopsy of the suspicious mass in the upper-outer right breast as well as of the indeterminate right axillary lymph node is recommended. This will subsequently be performed and dictated separately.  I have discussed the findings and recommendations with the patient. Results were also provided in writing at the conclusion of the visit. If applicable, a reminder letter will be sent to the patient regarding the next appointment.  BI-RADS CATEGORY 4: Suspicious.   Electronically Signed By: Everlean Alstrom M.D. On: 05/15/2014 09:10  CLINICAL DATA: Suspicious mass in the upper-outer right breast.  EXAM: ULTRASOUND GUIDED RIGHT BREAST CORE NEEDLE BIOPSY  COMPARISON: Previous exam(s).  PROCEDURE: I met with the patient and we discussed the procedure of ultrasound-guided biopsy, including benefits  and alternatives. We discussed the high likelihood of a successful procedure. We discussed the risks of the procedure including infection, bleeding, tissue injury, clip migration,  and inadequate sampling. Informed written consent was given. The usual time-out protocol was performed immediately prior to the procedure.  Using sterile technique and 2% Lidocaine as local anesthetic, under direct ultrasound visualization, a 12 gauge vacuum-assisted device was used to perform biopsy of the suspicious mass in the upper-outer right breast at 10 o'clockusing a lateral to medial approach. At the conclusion of the procedure, a ribbon tissue marker clip was deployed into the biopsy cavity. Follow-up 2-view mammogram was performed and dictated separately.  Using sterile technique and 2% Lidocaine as local anesthetic, under direct ultrasound visualization, a 12 gauge vacuum-assisted device was used to perform biopsy of a lymph node with a mildly thickened cortex in the lower right axillausing a lateral to medial approach.  IMPRESSION: 1. Ultrasound-guided biopsy of the suspicious mass in the upper-outer right breast at 10 o'clock. No apparent complications.  2. Ultrasound-guided biopsy of an indeterminate lymph node in the low right axilla.   Electronically Signed By: Everlean Alstrom M.D. On: 05/15/2014 09:45  CLINICAL DATA: Suspicious mass in the upper-outer right breast.  EXAM: ULTRASOUND GUIDED RIGHT BREAST CORE NEEDLE BIOPSY  COMPARISON: Previous exam(s).  PROCEDURE: I met with the patient and we discussed the procedure of ultrasound-guided biopsy, including benefits and alternatives. We discussed the high likelihood of a successful procedure. We discussed the risks of the procedure including infection, bleeding, tissue injury, clip migration, and inadequate sampling. Informed written consent was given. The usual time-out protocol was performed immediately prior to the procedure.  Using sterile technique and 2% Lidocaine as local anesthetic, under direct ultrasound visualization, a 12 gauge vacuum-assisted device was used to perform biopsy of the  suspicious mass in the upper-outer right breast at 10 o'clockusing a lateral to medial approach. At the conclusion of the procedure, a ribbon tissue marker clip was deployed into the biopsy cavity. Follow-up 2-view mammogram was performed and dictated separately.  Using sterile technique and 2% Lidocaine as local anesthetic, under direct ultrasound visualization, a 12 gauge vacuum-assisted device was used to perform biopsy of a lymph node with a mildly thickened cortex in the lower right axillausing a lateral to medial approach.  IMPRESSION: 1. Ultrasound-guided biopsy of the suspicious mass in the upper-outer right breast at 10 o'clock. No apparent complications.  2. Ultrasound-guided biopsy of an indeterminate lymph node in the low right axilla.   Electronically Signed By: Everlean Alstrom M.D. On: 05/15/2014 09:45   CLINICAL DATA: Patient with recent diagnosis right breast invasive ductal carcinoma.  EXAM: BILATERAL BREAST MRI WITH AND WITHOUT CONTRAST  TECHNIQUE: Multiplanar, multisequence MR images of both breasts were obtained prior to and following the intravenous administration of 17 ml of MultiHance.  THREE-DIMENSIONAL MR IMAGE RENDERING ON INDEPENDENT WORKSTATION:  Three-dimensional MR images were rendered by post-processing of the original MR data on an independent workstation. The three-dimensional MR images were interpreted, and findings are reported in the following complete MRI report for this study. Three dimensional images were evaluated at the independent DynaCad workstation  COMPARISON: Previous exam(s).  FINDINGS: Breast composition: c. Heterogeneous fibroglandular tissue.  Background parenchymal enhancement: Moderate.  Right breast: Within upper-outer right breast middle to posterior depth there is a 2.7 x 2.7 x 3.3 cm heterogeneously enhancing mass compatible with recently biopsied right breast carcinoma. Extending anterior and lateral  to the mass is approximately 3 cm of non mass enhancement.  This may potentially represent post biopsy changes and/or associated disease.  Within the upper inner right breast anterior depth there is an 8 x 8 x 8 mm irregular enhancing mass.  Within the lower inner right breast middle depth there is a 6 x 6 x 6 mm oval enhancing mass.  Left breast: Scattered foci of enhancement are demonstrated. No suspicious enhancing mass.  Lymph nodes: There are multiple cortically thickened right axillary lymph nodes. The most cortically thickened lymph node measures 1.2 cm.  Ancillary findings: Multiple T2 bright masses are demonstrated throughout the liver measuring up to 10 mm.  IMPRESSION: 1. Irregular enhancing mass within the upper-outer right breast compatible with biopsy-proven carcinoma. Surrounding non mass enhancement potentially secondary to biopsy changes or associated disease. 2. Indeterminate 8 mm irregular enhancing mass within the upper inner right breast. 3. Indeterminate enhancing mass within the lower inner right breast. 4. Multiple cortically thickened right axillary lymph nodes. A previous right axillary lymph node biopsy has been performed. 5. Multiple nonspecific T2 bright masses within the liver. These may potentially represent cysts however are indeterminate on non dedicated imaging.  RECOMMENDATION: 1. MRI guided core needle biopsy of the enhancing masses within the upper inner and lower inner right breast. 2. Consider dedicated evaluation of the liver with pre and post contrast enhanced MRI.  BI-RADS CATEGORY 4: Suspicious.   Electronically Signed By: Lovey Newcomer M.D. On: 05/20/2014 16:08   Menarche - age 44 Nulliparous Hormones - OCP x 22 years Menopause age 28 Family history - negative Other Problems Elbert Ewings, CMA; 05/22/2014 11:22 AM) Breast Cancer Lump In Breast  Past Surgical History Elbert Ewings, CMA; 05/22/2014 11:22 AM) Breast  Biopsy Right. Spinal Surgery - Neck  Diagnostic Studies History Elbert Ewings, CMA; 05/22/2014 11:22 AM) Colonoscopy 5-10 years ago Mammogram within last year Pap Smear 1-5 years ago  Allergies Elbert Ewings, CMA; 05/22/2014 11:22 AM) No Known Drug Allergies 05/22/2014  Medication History Elbert Ewings, CMA; 05/22/2014 11:23 AM) Amphetamine-Dextroamphetamine (30MG Tablet, Oral) Active. Citalopram Hydrobromide (20MG Tablet, Oral) Active. HydrOXYzine Pamoate (50MG Capsule, Oral) Active. Adderall (30MG Tablet, Oral) Active. Medications Reconciled  Social History Elbert Ewings, [place]; 05/22/2014 11:22 AM) No alcohol use No caffeine use No drug use Tobacco use Never smoker.  Family History Elbert Ewings, [place]; 05/22/2014 11:22 AM) First Degree Relatives No pertinent family history  Pregnancy / Birth History Elbert Ewings, [place]; 05/22/2014 11:22 AM) Age at menarche 43 years. Age of menopause 51-55 Contraceptive History Oral contraceptives. Gravida 0 Para 0 Regular periods     Review of Systems Elbert Ewings CMA; 05/22/2014 11:22 AM) General Present- Weight Gain. Not Present- Appetite Loss, Chills, Fatigue, Fever, Night Sweats and Weight Loss. Skin Present- Dryness. Not Present- Change in Wart/Mole, Hives, Jaundice, New Lesions, Non-Healing Wounds, Rash and Ulcer. HEENT Present- Wears glasses/contact lenses. Not Present- Earache, Hearing Loss, Hoarseness, Nose Bleed, Oral Ulcers, Ringing in the Ears, Seasonal Allergies, Sinus Pain, Sore Throat, Visual Disturbances and Yellow Eyes. Breast Present- Breast Mass. Not Present- Breast Pain, Nipple Discharge and Skin Changes. Gastrointestinal Present- Constipation and Hemorrhoids. Not Present- Abdominal Pain, Bloating, Bloody Stool, Change in Bowel Habits, Chronic diarrhea, Difficulty Swallowing, Excessive gas, Gets full quickly at meals, Indigestion, Nausea, Rectal Pain and Vomiting. Female Genitourinary Not Present- Frequency,  Nocturia, Painful Urination, Pelvic Pain and Urgency. Musculoskeletal Present- Back Pain. Not Present- Joint Pain, Joint Stiffness, Muscle Pain, Muscle Weakness and Swelling of Extremities. Neurological Not Present- Decreased Memory, Fainting, Headaches, Numbness, Seizures, Tingling, Tremor, Trouble walking and Weakness. Psychiatric  Not Present- Anxiety, Bipolar, Change in Sleep Pattern, Depression, Fearful and Frequent crying. Endocrine Present- Hot flashes. Not Present- Cold Intolerance, Excessive Hunger, Hair Changes, Heat Intolerance and New Diabetes. Hematology Not Present- Easy Bruising, Excessive bleeding, Gland problems, HIV and Persistent Infections.  Vitals Elbert Ewings CMA; 05/22/2014 11:24 AM) 05/22/2014 11:23 AM Weight: 181 lb Height: 63in Body Surface Area: 1.91 m Body Mass Index: 32.06 kg/m Temp.: 97.45F(Oral)  Pulse: 74 (Regular)  Resp.: 17 (Unlabored)  BP: 122/78 (Sitting, Left Arm, Standard)     Physical Exam Rodman Key K. Hoyt Leanos MD; 05/23/2014 5:31 PM)  The physical exam findings are as follows: Note:WDWN in NAD HEENT: EOMI, sclera anicteric Neck: No masses, no thyromegaly Breasts: symmetric; no nipple retraction or discharge; no axillary lymphadenopathy No sign of left breast mass Palpable right breast mass - upper outer quadrant near axilla - 3 cm; no overlying skin changes Lungs: CTA bilaterally; normal respiratory effort CV: Regular rate and rhythm; no murmurs Abd: +bowel sounds, soft, non-tender, no masses Ext: Well-perfused; no edema Skin: Warm, dry; no sign of jaundice    Assessment & Plan Rodman Key K. Emara Lichter MD; 05/23/2014 7:33 PM)  CANCER OF RIGHT BREAST, STAGE 1, ESTROGEN RECEPTOR NEGATIVE (174.9  C50.911)  Current Plans Pt Education - [facility] Free Text Education/Instructions: discussed with patient and provided information. Note:I spent a considerable amount of time with the patient and her mother, cousin, and friend. We discussed her  diagnosis and the significance of the triple-negative status. The lymph nodes appear abnormal on ultrasound and MRI, although the biopsy was negative. These certainly need to be investigated further. The two lesions seen on MRI are to be biopsied next week. We will await the results from those biopsies before determining a surgical plan. It is possible that if she has more extensive disease, she may be a candidate for neoadjuvant chemotherapy. Alternatively, she may benefit from mastectomy with sentinel lymph node biopsy. We will await the biopsy results and discuss her case at Breast Conference this week. If she opts for mastectomy (with or without reconstruction), then I have discussed the procedure with the patient. Potential risks, benefits, alternative treatments, and expected outcomes have been explained. All of the patient's questions at this time have been answered. The likelihood of reaching the patient's treatment goal is good. The patient understand the proposed surgical procedure. We will discuss this further after the biopsies.   Misty Snyder. Georgette Dover, MD, [facility] Surgery  General/ Trauma Surgery  05/23/2014 7:34 PM

## 2014-06-17 NOTE — Interval H&P Note (Signed)
History and Physical Interval Note:  06/17/2014 7:15 AM  Misty Snyder  has presented today for surgery, with the diagnosis of Right Breast Cancer  The various methods of treatment have been discussed with the patient and family. After consideration of risks, benefits and other options for treatment, the patient has consented to  Procedure(s): RIGHT MASTECTOMY WITH SENTINEL LYMPH NODE BIOPSY AND PORT PLACEMENT (Right) INSERTION PORT-A-CATH (Right) as a surgical intervention .  She had consultations with Medical Oncology and Radiation Oncology and has opted against further biopsies.  She has also opted against immediate reconstruction. The patient's history has been reviewed, patient examined, no change in status, stable for surgery.  I have reviewed the patient's chart and labs.  Questions were answered to the patient's satisfaction.     Shambria Camerer K.

## 2014-06-17 NOTE — Transfer of Care (Signed)
Immediate Anesthesia Transfer of Care Note  Patient: Misty Snyder  Procedure(s) Performed: Procedure(s): RIGHT MASTECTOMY WITH SENTINEL LYMPH NODE BIOPSY WITH BLUE DYE MAPPING AND PORT PLACEMENT (Right) INSERTION PORT-A-CATH (Left)  Patient Location: PACU  Anesthesia Type:General  Level of Consciousness: sedated, patient cooperative and responds to stimulation  Airway & Oxygen Therapy: Patient Spontanous Breathing and Patient connected to face mask oxygen  Post-op Assessment: Report given to RN, Post -op Vital signs reviewed and stable and Patient moving all extremities X 4  Post vital signs: Reviewed and stable  Last Vitals:  Filed Vitals:   06/17/14 0950  BP: 123/65  Pulse: 105  Temp:   Resp: 27    Complications: No apparent anesthesia complications

## 2014-06-17 NOTE — Anesthesia Preprocedure Evaluation (Signed)
Anesthesia Evaluation  Patient identified by MRN, date of birth, ID band Patient awake    Reviewed: Allergy & Precautions, NPO status , Patient's Chart, lab work & pertinent test results  Airway Mallampati: II   Neck ROM: full    Dental   Pulmonary neg pulmonary ROS,  breath sounds clear to auscultation        Cardiovascular negative cardio ROS  Rhythm:regular Rate:Normal     Neuro/Psych PSYCHIATRIC DISORDERS Depression    GI/Hepatic   Endo/Other  obese  Renal/GU      Musculoskeletal   Abdominal   Peds  Hematology   Anesthesia Other Findings   Reproductive/Obstetrics                             Anesthesia Physical Anesthesia Plan  ASA: II  Anesthesia Plan: General and Regional   Post-op Pain Management:    Induction: Intravenous  Airway Management Planned: LMA  Additional Equipment:   Intra-op Plan:   Post-operative Plan:   Informed Consent: I have reviewed the patients History and Physical, chart, labs and discussed the procedure including the risks, benefits and alternatives for the proposed anesthesia with the patient or authorized representative who has indicated his/her understanding and acceptance.     Plan Discussed with: CRNA, Anesthesiologist and Surgeon  Anesthesia Plan Comments:         Anesthesia Quick Evaluation

## 2014-06-18 ENCOUNTER — Encounter (HOSPITAL_COMMUNITY): Payer: Self-pay | Admitting: General Practice

## 2014-06-18 DIAGNOSIS — C50911 Malignant neoplasm of unspecified site of right female breast: Secondary | ICD-10-CM | POA: Diagnosis not present

## 2014-06-18 HISTORY — PX: MASTECTOMY W/ SENTINEL NODE BIOPSY: SHX2001

## 2014-06-18 HISTORY — PX: INSERTION CENTRAL VENOUS ACCESS DEVICE W/ SUBCUTANEOUS PORT: SUR725

## 2014-06-18 LAB — BASIC METABOLIC PANEL
ANION GAP: 10 (ref 5–15)
BUN: 6 mg/dL (ref 6–20)
CALCIUM: 8.1 mg/dL — AB (ref 8.9–10.3)
CO2: 23 mmol/L (ref 22–32)
CREATININE: 0.76 mg/dL (ref 0.44–1.00)
Chloride: 103 mmol/L (ref 101–111)
GFR calc non Af Amer: >60 mL/min (ref >60)
GLUCOSE: 170 mg/dL — AB (ref 65–99)
POTASSIUM: 3.6 mmol/L (ref 3.5–5.1)
SODIUM: 136 mmol/L (ref 135–145)

## 2014-06-18 LAB — CBC
HEMATOCRIT: 34.6 % — AB (ref 36.0–46.0)
HEMOGLOBIN: 11 g/dL — AB (ref 12.0–15.0)
MCH: 26.4 pg (ref 26.0–34.0)
MCHC: 31.8 g/dL (ref 30.0–36.0)
MCV: 83 fL (ref 78.0–100.0)
PLATELETS: 236 10*3/uL (ref 150–400)
RBC: 4.17 MIL/uL (ref 3.87–5.11)
RDW: 15.9 % — ABNORMAL HIGH (ref 11.5–15.5)
WBC: 8.2 10*3/uL (ref 4.0–10.5)

## 2014-06-18 NOTE — Progress Notes (Signed)
1 Day Post-Op  Subjective: Having some nausea Requiring IV pain meds  Objective: Vital signs in last 24 hours: Temp:  [97.5 F (36.4 C)-98.7 F (37.1 C)] 98.3 F (36.8 C) (06/02 0627) Pulse Rate:  [73-105] 79 (06/02 0627) Resp:  [10-27] 16 (06/02 0627) BP: (98-144)/(58-85) 98/58 mmHg (06/02 0627) SpO2:  [94 %-100 %] 98 % (06/02 0627) Weight:  [83.462 kg (184 lb)] 83.462 kg (184 lb) (06/01 0822)    Intake/Output from previous day: 06/01 0701 - 06/02 0700 In: 3003.8 [P.O.:360; I.V.:2643.8] Out: 115 [Drains:115] Intake/Output this shift:    General appearance: alert, cooperative and no distress Chest wall: right sided chest wall tenderness Drain with serosanguinous fluid Flaps appear viable around dressing   Lab Results:   Recent Labs  06/17/14 1603 06/18/14 0400  WBC 8.1 8.2  HGB 12.4 11.0*  HCT 38.3 34.6*  PLT 241 236   BMET  Recent Labs  06/17/14 1603 06/18/14 0400  NA  --  136  K  --  3.6  CL  --  103  CO2  --  23  GLUCOSE  --  170*  BUN  --  6  CREATININE 0.68 0.76  CALCIUM  --  8.1*   PT/INR No results for input(s): LABPROT, INR in the last 72 hours. ABG No results for input(s): PHART, HCO3 in the last 72 hours.  Invalid input(s): PCO2, PO2  Studies/Results: Nm Sentinel Node Inj-no Rpt (breast)  06/17/2014   CLINICAL DATA: Right breast cancer   Sulfur colloid was injected intradermally by the nuclear medicine  technologist for breast cancer sentinel node localization.    Dg Chest Port 1 View  06/17/2014   CLINICAL DATA:  Encounter for central line placement.  EXAM: PORTABLE CHEST - 1 VIEW  COMPARISON:  None.  FINDINGS: Left subclavian Port-A-Cath has been placed. The tip is in the mid right atrium. No pneumothorax. There are low lung volumes. Mild vascular congestion. Left perihilar airspace opacities could reflect asymmetric edema or atelectasis. Postoperative changes noted in the right chest wall.  IMPRESSION: Left Port-A-Cath placement as  above.  No pneumothorax.  Postoperative changes in the right chest wall.  Vascular congestion with asymmetric edema or atelectasis in the left perihilar regions.   Electronically Signed   By: Rolm Baptise M.D.   On: 06/17/2014 12:54   Dg Fluoro Guide Cv Line-no Report  06/17/2014   CLINICAL DATA:    FLOURO GUIDE CV LINE  Fluoroscopy was utilized by the requesting physician.  No radiographic  interpretation.     Anti-infectives: Anti-infectives    Start     Dose/Rate Route Frequency Ordered Stop   06/17/14 1615  ceFAZolin (ANCEF) IVPB 1 g/50 mL premix     1 g 100 mL/hr over 30 Minutes Intravenous Every 6 hours 06/17/14 1527 06/18/14 0350   06/17/14 0900  ceFAZolin (ANCEF) IVPB 2 g/50 mL premix  Status:  Discontinued     2 g 100 mL/hr over 30 Minutes Intravenous To Surgery 06/16/14 1015 06/17/14 1508      Assessment/Plan: s/p Procedure(s): RIGHT MASTECTOMY WITH SENTINEL LYMPH NODE BIOPSY WITH BLUE DYE MAPPING AND PORT PLACEMENT (Right) INSERTION PORT-A-CATH (Left) Advance diet Plan for discharge tomorrow Encourage PO pain meds; ambulation  LOS: 1 day    Tariyah Pendry K. 06/18/2014

## 2014-06-18 NOTE — Care Management Note (Signed)
Case Management Note  Patient Details  Name: ODELLE KOSIER MRN: [id number] Date of Birth: 23-Aug-1960  Subjective/Objective:                    Action/Plan:  UR completed  Expected Discharge Date:    06-18-14               Expected Discharge Plan:  Home/Self Care  In-House Referral:     Discharge planning Services     Post Acute Care Choice:    Choice offered to:     DME Arranged:    DME Agency:     HH Arranged:    [place] Agency:     Status of Service:  Completed, signed off  Medicare Important Message Given:    Date Medicare IM Given:    Medicare IM give by:    Date Additional Medicare IM Given:    Additional Medicare Important Message give by:     If discussed at [place] of Stay Meetings, dates discussed:    Additional Comments:  Marilu Favre, RN 06/18/2014, 8:00 AM

## 2014-06-19 DIAGNOSIS — C50911 Malignant neoplasm of unspecified site of right female breast: Secondary | ICD-10-CM | POA: Diagnosis not present

## 2014-06-19 MED ORDER — OXYCODONE-ACETAMINOPHEN 5-325 MG PO TABS: 1.0000 | ORAL_TABLET | ORAL | Status: DC | PRN

## 2014-06-19 NOTE — Discharge Instructions (Signed)
[facility] [place] °[phone] ° °MASTECTOMY: POST OP INSTRUCTIONS ° °Always review your discharge instruction sheet given to you by the facility where your surgery was performed. °IF YOU HAVE DISABILITY OR FAMILY LEAVE FORMS, YOU MUST BRING THEM TO THE OFFICE FOR PROCESSING.   °DO NOT GIVE THEM TO YOUR DOCTOR. °A prescription for pain medication may be given to you upon discharge.  Take your pain medication as prescribed, if needed.  If narcotic pain medicine is not needed, then you may take acetaminophen (Tylenol) or ibuprofen (Advil) as needed. °1. Take your usually prescribed medications unless otherwise directed. °2. If you need a refill on your pain medication, please contact your pharmacy.  They will contact our office to request authorization.  Prescriptions will not be filled after 5pm or on week-ends. °3. You should follow a light diet the first few days after arrival home, such as soup and crackers, etc.  Resume your normal diet the day after surgery. °4. Most patients will experience some swelling and bruising on the chest and underarm.  Ice packs will help.  Swelling and bruising can take several days to resolve.  °5. It is common to experience some constipation if taking pain medication after surgery.  Increasing fluid intake and taking a stool softener (such as Colace) will usually help or prevent this problem from occurring.  A mild laxative (Milk of Magnesia or Miralax) should be taken according to package instructions if there are no bowel movements after 48 hours. °6. Unless discharge instructions indicate otherwise, leave your bandage dry and in place until your next appointment in 3-5 days.  You may take a limited sponge bath.  No tube baths or showers until the drains are removed.  You may have steri-strips (small skin tapes) in place directly over the incision.  These strips should be left on the skin for 7-10 days.  If your surgeon used skin glue on the incision, you may  shower in 24 hours.  The glue will flake off over the next 2-3 weeks.  Any sutures or staples will be removed at the office during your follow-up visit. °7. DRAINS:  If you have drains in place, it is important to keep a list of the amount of drainage produced each day in your drains.  Before leaving the hospital, you should be instructed on drain care.  Call our office if you have any questions about your drains. °8. ACTIVITIES:  You may resume regular (light) daily activities beginning the next day--such as daily self-care, walking, climbing stairs--gradually increasing activities as tolerated.  You may have sexual intercourse when it is comfortable.  Refrain from any heavy lifting or straining until approved by your doctor. °a. You may drive when you are no longer taking prescription pain medication, you can comfortably wear a seatbelt, and you can safely maneuver your car and apply brakes. °b. RETURN TO WORK:  __________________________________________________________ °9. You should see your doctor in the office for a follow-up appointment approximately 3-5 days after your surgery.  Your doctor’s nurse will typically make your follow-up appointment when she calls you with your pathology report.  Expect your pathology report 2-3 business days after your surgery.  You may call to check if you do not hear from us after three days.   °10. OTHER INSTRUCTIONS: ______________________________________________________________________________________________ ____________________________________________________________________________________________ °WHEN TO CALL YOUR DOCTOR: °1. Fever over 101.0 °2. Nausea and/or vomiting °3. Extreme swelling or bruising °4. Continued bleeding from incision. °5. Increased pain, redness, or drainage from the incision. °  The clinic staff is available to answer your questions during regular business hours.  Please don’t hesitate to call and ask to speak to one of the nurses for clinical  concerns.  If you have a medical emergency, go to the nearest emergency room or call 911.  A surgeon from [facility] is always on call at the hospital. °[address], [address], [place], [place]  [address] ? [address] [address], ,    [address] °[phone] ? [phone] ? FAX [phone] ° °

## 2014-06-19 NOTE — Progress Notes (Signed)
  Pt verbally understands DC instructions. Instructed on JP care and supplies sent home with pt.Marland Kitchen

## 2014-06-19 NOTE — Discharge Summary (Signed)
Physician Discharge Summary  Patient ID: Misty Snyder MRN: [id number] DOB/AGE: [birth year] 54 y.o.  Admit date: 06/17/2014 Discharge date: 06/19/2014  Admission Diagnoses:  Right breast cancer   Discharge Diagnoses: same  Active Problems:   Breast cancer, female   Discharged Condition: good  Hospital Course: Right mastectomy with sentinel lymph node biopsy and left subclavian vein port placement on 06/17/14.  She had considerable pain on POD#1 but improved significantly.  She feels ready for discharge on POD#2.  Consults: None  Significant Diagnostic Studies:  CXR - no pneumothorax    Treatments: surgery: as above  Discharge Exam: Blood pressure 129/70, pulse 87, temperature 98.4 F (36.9 C), temperature source Oral, resp. rate 20, height 5\' 3"  (1.6 m), weight 83.462 kg (184 lb), last menstrual period 03/17/2011, SpO2 95 %. General appearance: alert, cooperative and no distress Chest wall: right sided chest wall tenderness skin flaps viable  Drain - serosanguinous Staple line intact   Disposition:  Discharge to home  Empty and record drain output    Medication List    ASK your doctor about these medications        amphetamine-dextroamphetamine 30 MG tablet  Commonly known as:  ADDERALL  Take 1 tablet by mouth daily.     citalopram 40 MG tablet  Commonly known as:  CELEXA  TAKE 1 TABLET (40 MG TOTAL) BY MOUTH DAILY.     HYDROcodone-acetaminophen 7.5-750 MG per tablet  Commonly known as:  VICODIN ES  Half tab at bedtime as needed     hydrOXYzine 50 MG tablet  Commonly known as:  ATARAX/VISTARIL  Take 50 mg by mouth 3 (three) times daily as needed (for sleep).           Follow-up Information    Follow up with Maia Petties., MD In 1 week.   Specialty:  General Surgery   Why:  For drain removal   Contact information:   [address] [place] [address] [phone]       Signed: Joei Frangos K. 06/19/2014, 7:58 AM

## 2014-06-23 ENCOUNTER — Ambulatory Visit (HOSPITAL_COMMUNITY): Payer: BC Managed Care – PPO

## 2014-06-23 ENCOUNTER — Encounter: Payer: Self-pay | Admitting: *Deleted

## 2014-06-23 ENCOUNTER — Telehealth: Payer: Self-pay | Admitting: Hematology and Oncology

## 2014-06-23 ENCOUNTER — Other Ambulatory Visit: Payer: Self-pay | Admitting: *Deleted

## 2014-06-23 ENCOUNTER — Ambulatory Visit (HOSPITAL_BASED_OUTPATIENT_CLINIC_OR_DEPARTMENT_OTHER): Payer: BC Managed Care – PPO | Admitting: Hematology and Oncology

## 2014-06-23 VITALS — BP 120/65 | HR 81 | Temp 97.5°F | Resp 20 | Ht 63.0 in | Wt 182.7 lb

## 2014-06-23 DIAGNOSIS — C50411 Malignant neoplasm of upper-outer quadrant of right female breast: Secondary | ICD-10-CM

## 2014-06-23 MED ORDER — LIDOCAINE-PRILOCAINE 2.5-2.5 % EX CREA: TOPICAL_CREAM | CUTANEOUS | Status: DC

## 2014-06-23 MED ORDER — PROCHLORPERAZINE MALEATE 10 MG PO TABS: 10.0000 mg | ORAL_TABLET | Freq: Four times a day (QID) | ORAL | Status: DC | PRN

## 2014-06-23 MED ORDER — ONDANSETRON HCL 8 MG PO TABS: 8.0000 mg | ORAL_TABLET | Freq: Two times a day (BID) | ORAL | Status: DC | PRN

## 2014-06-23 MED ORDER — DEXAMETHASONE 4 MG PO TABS: ORAL_TABLET | ORAL | Status: DC

## 2014-06-23 MED ORDER — LORAZEPAM 0.5 MG PO TABS: 0.5000 mg | ORAL_TABLET | Freq: Every day | ORAL | Status: DC

## 2014-06-23 NOTE — Progress Notes (Signed)
Met with pt during post op visit with Dr. Lindi Adie. Relate she is doing well. Denies needs at this time. Discussed using Claritin after neulasta injection. Encourage pt to call with questions or concerns. Received verbal understanding.

## 2014-06-23 NOTE — Telephone Encounter (Signed)
Gave avs & calendar for July thru November.

## 2014-06-23 NOTE — Progress Notes (Signed)
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, Ki-67 90%   05/20/2014 Breast MRI Right breast mass: 2.7 x 2.7 x 3.3 cm along with 3 cm non-mass enhancement, upper inner quadrant 8 mm mass, lower inner quadrant 6 mm mass, right axillary lymph nodes 1.2 cm, T2 bright liver lesions   06/17/2014 Surgery Right breast mastectomy: IDC, 3.6 cm, grade 3, 0/4 lymph nodes, T2 N0 M0 stage II a, ER 0%, PR 0%, HER-2 negative, Ki-67 90%    CHIEF COMPLIANT: Follow-up after mastectomy to discuss the results  INTERVAL HISTORY: Misty Snyder is a 54 year old lady with above-mentioned history of right-sided breast cancer who underwent mastectomy on 06/17/2014. Final pathology revealed a 3.6 cm mass grade 3 with negative margins and 4 benign lymph nodes. She is healing very well from the surgery. She had a port placement while she was undergoing surgery.  REVIEW OF SYSTEMS:   Constitutional: Denies fevers, chills or abnormal weight loss Eyes: Denies blurriness of vision Ears, nose, mouth, throat, and face: Denies mucositis or sore throat Respiratory: Denies cough, dyspnea or wheezes Cardiovascular: Denies palpitation, chest discomfort or lower extremity swelling Gastrointestinal:  Denies nausea, heartburn or change in bowel habits Skin: Denies abnormal skin rashes Lymphatics: Denies new lymphadenopathy or easy bruising Neurological:Denies numbness, tingling or new weaknesses Behavioral/Psych: Mood is stable, no new changes  Breast: Breast discomfort from recent surgery All other systems were reviewed with the patient and are  negative.  I have reviewed the past medical history, past surgical history, social history and family history with the patient and they are unchanged from previous note.  ALLERGIES:  has No Known Allergies.  MEDICATIONS:  Current Outpatient Prescriptions  Medication Sig Dispense Refill  . amphetamine-dextroamphetamine (ADDERALL) 30 MG tablet Take 1 tablet by mouth daily. 30 tablet 0  . citalopram (CELEXA) 40 MG tablet TAKE 1 TABLET (40 MG TOTAL) BY MOUTH DAILY. 90 tablet 3  . HYDROcodone-acetaminophen (VICODIN ES) 7.5-750 MG per tablet Half tab at bedtime as needed (Patient not taking: Reported on 06/08/2014) 30 tablet 2  . hydrOXYzine (ATARAX/VISTARIL) 50 MG tablet Take 50 mg by mouth 3 (three) times daily as needed (for sleep).    Marland Kitchen oxyCODONE-acetaminophen (PERCOCET/ROXICET) 5-325 MG per tablet Take 1-2 tablets by mouth every 4 (four) hours as needed for moderate pain. 30 tablet 0   No current facility-administered medications for this visit.    PHYSICAL EXAMINATION: ECOG PERFORMANCE STATUS: 1 - Symptomatic but completely ambulatory  There were no vitals filed for this visit. There were no vitals filed for this visit.  GENERAL:alert, no distress and comfortable SKIN: skin color, texture, turgor are normal, no rashes or significant lesions EYES: normal, Conjunctiva are pink and non-injected, sclera clear OROPHARYNX:no exudate, no erythema and lips, buccal mucosa, and tongue normal  NECK: supple, thyroid normal size, non-tender, without nodularity LYMPH:  no palpable lymphadenopathy in the cervical, axillary or inguinal LUNGS: clear to auscultation and percussion with normal breathing effort HEART: regular rate & rhythm and no murmurs and no lower extremity edema ABDOMEN:abdomen soft, non-tender and normal bowel sounds Musculoskeletal:no cyanosis  of digits and no clubbing  NEURO: alert & oriented x 3 with fluent speech, no focal motor/sensory deficits  LABORATORY DATA:  I have  reviewed the data as listed   Chemistry      Component Value Date/Time   NA 136 06/18/2014 0400   K 3.6 06/18/2014 0400   CL 103 06/18/2014 0400   CO2 23 06/18/2014 0400   BUN 6 06/18/2014 0400   CREATININE 0.76 06/18/2014 0400      Component Value Date/Time   CALCIUM 8.1* 06/18/2014 0400   ALKPHOS 66 02/10/2014 1555   AST 16 02/10/2014 1555   ALT 13 02/10/2014 1555   BILITOT 0.3 02/10/2014 1555       Lab Results  Component Value Date   WBC 8.2 06/18/2014   HGB 11.0* 06/18/2014   HCT 34.6* 06/18/2014   MCV 83.0 06/18/2014   PLT 236 06/18/2014   NEUTROABS 2.7 02/10/2014    ASSESSMENT & PLAN:  Breast cancer of upper-outer quadrant of right female breast Right breast mastectomy 06/17/2014: IDC, 3.6 cm, grade 3, 0/4 lymph nodes, T2 N0 M0 stage II a, ER 0%, PR 0%, HER-2 negative, Ki-67 90%  Pathology review: I discussed the pathology report in great detail and provided her with a copy of this report. Recommendation: 1. Adjuvant chemotherapy with dose dense Adriamycin and Cytoxan followed by Abraxane weekly 12 Patient is also eligible to participate in an [place] BR003 clinical trial randomizing between Adriamycin and Cytoxan 4 followed by weekly Taxol 12 versus adding carboplatin every 3 week x 4 with weekly Taxol  2. In anticipation of chemotherapy, port has been placed 3. Echocardiogram to be done today chemotherapy classes being completed Plan to start chemotherapy 07/22/2014 If she decides to go on the trial and if she is eligible, we will have to change the treatment plan.  Return to clinic in July 6 to start chemotherapy  No orders of the defined types were placed in this encounter.   The patient has a good understanding of the overall plan. she agrees with it. she will call with any problems that may develop before the next visit here.   Rulon Eisenmenger, MD

## 2014-06-23 NOTE — Assessment & Plan Note (Signed)
Right breast mastectomy 06/17/2014: IDC, 3.6 cm, grade 3, 0/4 lymph nodes, T2 N0 M0 stage II a, ER 0%, PR 0%, HER-2 negative, Ki-67 90%  Pathology review: I discussed the pathology report in great detail and provided her with a copy of this report. Recommendation: 1. Adjuvant chemotherapy with dose dense Adriamycin and Cytoxan followed by Abraxane weekly 12 Patient is also eligible to participate in an [place] BR003 clinical trial randomizing between Adriamycin and Cytoxan 4 followed by weekly Taxol 12 versus adding carboplatin every 3 week x 4 with weekly Taxol  2. In anticipation of chemotherapy, port has been placed 3. Chemotherapy class and echocardiogram will need to be done.  Return to clinic in 3 weeks to start chemotherapy

## 2014-06-25 ENCOUNTER — Encounter: Payer: Self-pay | Admitting: Genetic Counselor

## 2014-06-25 DIAGNOSIS — Z1379 Encounter for other screening for genetic and chromosomal anomalies: Secondary | ICD-10-CM | POA: Insufficient documentation

## 2014-06-25 NOTE — Progress Notes (Signed)
GENETIC TEST RESULTS  Patient Name: Misty Snyder Patient Age: 54 y.o. Encounter Date: 06/25/2014  Referring Physician: Nicholas Lose, MD   [name] was called today to discuss genetic test results. Please see the Genetics note from her visit on 06/03/14 for a detailed discussion of her personal and family history.  GENETIC TESTING: At the time of [name]' visit, we recommended she pursue genetic testing of multiple genes on the BreastNext gene panel. This test, which included sequencing and deletion/duplication analysis of 17 genes, was performed at [facility]. Testing was normal and did not reveal a mutation in these genes. The genes tested were ATM, BARD1, BRCA1, BRCA2, BRIP1, CDH1, CHEK2, MRE11A, MUTYH, NBN, NF1, PALB2, PTEN, RAD50, RAD51C, RAD51D, and TP53.  We discussed with [name] that since the current test is not perfect, it is possible there may be a gene mutation that current testing cannot detect, but that chance is small. We also discussed that it is possible that a different genetic factor, which was not part of this testing or has not yet been discovered, is responsible for the cancer diagnoses in the family. This is also of low likelihood. Should [name] wish to discuss or pursue this additional testing, we are happy to coordinate this at any time, but do not feel that she is at significant risk of harboring a mutation in a different gene.     CANCER SCREENING: This result suggests that [name]' cancer was most likely not due to an inherited predisposition. Most cancers happen by chance and this negative test, along with details of her family history, suggests that her cancer falls into this category. We, therefore, recommended she continue to follow the cancer screening guidelines provided by her physician.   FAMILY MEMBERS: Women in the family are at some increased risk of developing breast cancer, over the general population risk, simply due to the  family history. We recommended they have a yearly mammogram beginning at age 42, a yearly clinical breast exam, and perform monthly breast self-exams. A gynecologic exam is recommended yearly. Colon cancer screening is recommended to begin by age 60.  Lastly, we discussed with [name] that cancer genetics is a rapidly advancing field and it is possible that new genetic tests will be appropriate for her in the future. We encouraged her to remain in contact with [place] on an annual basis so we can update her personal and family histories, and let her know of advances in cancer genetics that may benefit the family. Our contact number was provided. [name] questions were answered to her satisfaction today, and she knows she is welcome to call anytime with additional questions.    Steele Berg, MS, [place] Certified Genetic Counselor phone: [phone] [email or web address]

## 2014-06-30 ENCOUNTER — Other Ambulatory Visit (HOSPITAL_COMMUNITY): Payer: BC Managed Care – PPO

## 2014-07-01 ENCOUNTER — Other Ambulatory Visit: Payer: Self-pay

## 2014-07-01 ENCOUNTER — Ambulatory Visit (HOSPITAL_COMMUNITY): Payer: BC Managed Care – PPO | Attending: Cardiology

## 2014-07-01 DIAGNOSIS — C50411 Malignant neoplasm of upper-outer quadrant of right female breast: Secondary | ICD-10-CM

## 2014-07-10 ENCOUNTER — Telehealth: Payer: Self-pay

## 2014-07-10 NOTE — Telephone Encounter (Signed)
Pt called about a chemo study and asked to be called back

## 2014-07-10 NOTE — Telephone Encounter (Signed)
Attempting to call pt back.

## 2014-07-10 NOTE — Telephone Encounter (Signed)
Pt stated someone called her about a chemo study, no note found in chart, will check with the research dept.

## 2014-07-17 ENCOUNTER — Ambulatory Visit (HOSPITAL_COMMUNITY): Payer: BC Managed Care – PPO

## 2014-07-17 ENCOUNTER — Encounter: Payer: Self-pay | Admitting: *Deleted

## 2014-07-21 NOTE — Assessment & Plan Note (Signed)
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 Adjuvant chemo with dose dense Adriamycin and Cytoxan followed by Abraxane Current treatment: Cycle 1 day 1 of dose dense AC  Chemo monitoring 1. ECHO: 07/01/14: EF 60-65% 2. Anti-emetics were reviewed 3.Chemo education complete 4. Consent signed  RTC in 1 week for toxicity check

## 2014-07-22 ENCOUNTER — Telehealth: Payer: Self-pay | Admitting: Hematology and Oncology

## 2014-07-22 ENCOUNTER — Ambulatory Visit (HOSPITAL_BASED_OUTPATIENT_CLINIC_OR_DEPARTMENT_OTHER): Payer: BC Managed Care – PPO | Admitting: Hematology and Oncology

## 2014-07-22 ENCOUNTER — Encounter: Payer: Self-pay | Admitting: Hematology and Oncology

## 2014-07-22 ENCOUNTER — Ambulatory Visit (HOSPITAL_BASED_OUTPATIENT_CLINIC_OR_DEPARTMENT_OTHER): Payer: BC Managed Care – PPO

## 2014-07-22 ENCOUNTER — Other Ambulatory Visit (HOSPITAL_BASED_OUTPATIENT_CLINIC_OR_DEPARTMENT_OTHER): Payer: BC Managed Care – PPO

## 2014-07-22 VITALS — BP 118/74 | HR 82 | Temp 98.2°F | Resp 18 | Ht 63.0 in | Wt 185.5 lb

## 2014-07-22 DIAGNOSIS — Z5111 Encounter for antineoplastic chemotherapy: Secondary | ICD-10-CM

## 2014-07-22 DIAGNOSIS — C50411 Malignant neoplasm of upper-outer quadrant of right female breast: Secondary | ICD-10-CM

## 2014-07-22 LAB — CBC WITH DIFFERENTIAL/PLATELET
BASO%: 0.5 % (ref 0.0–2.0)
Basophils Absolute: 0 10*3/uL (ref 0.0–0.1)
EOS%: 2.2 % (ref 0.0–7.0)
Eosinophils Absolute: 0.1 10*3/uL (ref 0.0–0.5)
HEMATOCRIT: 38.7 % (ref 34.8–46.6)
HGB: 12.6 g/dL (ref 11.6–15.9)
LYMPH#: 1.1 10*3/uL (ref 0.9–3.3)
LYMPH%: 27.5 % (ref 14.0–49.7)
MCH: 26.7 pg (ref 25.1–34.0)
MCHC: 32.6 g/dL (ref 31.5–36.0)
MCV: 81.8 fL (ref 79.5–101.0)
MONO#: 0.4 10*3/uL (ref 0.1–0.9)
MONO%: 10.7 % (ref 0.0–14.0)
NEUT#: 2.4 10*3/uL (ref 1.5–6.5)
NEUT%: 59.1 % (ref 38.4–76.8)
Platelets: 230 10*3/uL (ref 145–400)
RBC: 4.73 10*6/uL (ref 3.70–5.45)
RDW: 15.6 % — ABNORMAL HIGH (ref 11.2–14.5)
WBC: 4.1 10*3/uL (ref 3.9–10.3)

## 2014-07-22 LAB — COMPREHENSIVE METABOLIC PANEL (CC13)
ALBUMIN: 3.8 g/dL (ref 3.5–5.0)
ALT: 20 U/L (ref 0–55)
AST: 19 U/L (ref 5–34)
Alkaline Phosphatase: 67 U/L (ref 40–150)
Anion Gap: 9 mEq/L (ref 3–11)
BILIRUBIN TOTAL: 0.3 mg/dL (ref 0.20–1.20)
BUN: 9.4 mg/dL (ref 7.0–26.0)
CO2: 26 mEq/L (ref 22–29)
Calcium: 9.6 mg/dL (ref 8.4–10.4)
Chloride: 107 mEq/L (ref 98–109)
Creatinine: 0.8 mg/dL (ref 0.6–1.1)
Glucose: 102 mg/dl (ref 70–140)
POTASSIUM: 3.8 meq/L (ref 3.5–5.1)
SODIUM: 142 meq/L (ref 136–145)
TOTAL PROTEIN: 7.3 g/dL (ref 6.4–8.3)

## 2014-07-22 MED ORDER — SODIUM CHLORIDE 0.9 % IV SOLN
Freq: Once | INTRAVENOUS | Status: AC
  Administered 2014-07-22: 12:00:00 via INTRAVENOUS

## 2014-07-22 MED ORDER — PEGFILGRASTIM 6 MG/0.6ML ~~LOC~~ PSKT
6.0000 mg | PREFILLED_SYRINGE | Freq: Once | SUBCUTANEOUS | Status: AC
  Administered 2014-07-22: 6 mg via SUBCUTANEOUS
  Filled 2014-07-22: qty 0.6

## 2014-07-22 MED ORDER — SODIUM CHLORIDE 0.9 % IV SOLN
600.0000 mg/m2 | Freq: Once | INTRAVENOUS | Status: AC
  Administered 2014-07-22: 1160 mg via INTRAVENOUS
  Filled 2014-07-22: qty 58

## 2014-07-22 MED ORDER — PALONOSETRON HCL INJECTION 0.25 MG/5ML
INTRAVENOUS | Status: AC
  Filled 2014-07-22: qty 5

## 2014-07-22 MED ORDER — FOSAPREPITANT DIMEGLUMINE INJECTION 150 MG
Freq: Once | INTRAVENOUS | Status: AC
Start: 2014-07-22 — End: 2014-07-22
  Administered 2014-07-22: 12:00:00 via INTRAVENOUS
  Filled 2014-07-22: qty 5

## 2014-07-22 MED ORDER — PALONOSETRON HCL INJECTION 0.25 MG/5ML
0.2500 mg | Freq: Once | INTRAVENOUS | Status: AC
  Administered 2014-07-22: 0.25 mg via INTRAVENOUS

## 2014-07-22 MED ORDER — DOXORUBICIN HCL CHEMO IV INJECTION 2 MG/ML
60.0000 mg/m2 | Freq: Once | INTRAVENOUS | Status: AC
  Administered 2014-07-22: 116 mg via INTRAVENOUS
  Filled 2014-07-22: qty 58

## 2014-07-22 MED ORDER — HEPARIN SOD (PORK) LOCK FLUSH 100 UNIT/ML IV SOLN
500.0000 [IU] | Freq: Once | INTRAVENOUS | Status: AC | PRN
  Administered 2014-07-22: 500 [IU]
  Filled 2014-07-22: qty 5

## 2014-07-22 MED ORDER — SODIUM CHLORIDE 0.9 % IJ SOLN
10.0000 mL | INTRAMUSCULAR | Status: DC | PRN
  Administered 2014-07-22: 10 mL
  Filled 2014-07-22: qty 10

## 2014-07-22 NOTE — Progress Notes (Signed)
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, Ki-67 90%   05/20/2014 Breast MRI Right breast mass: 2.7 x 2.7 x 3.3 cm along with 3 cm non-mass enhancement, upper inner quadrant 8 mm mass, lower inner quadrant 6 mm mass, right axillary lymph nodes 1.2 cm, T2 bright liver lesions   06/17/2014 Surgery Right breast mastectomy: IDC, 3.6 cm, grade 3, 0/4 lymph nodes, T2 N0 M0 stage II a, ER 0%, PR 0%, HER-2 negative, Ki-67 90%   07/22/2014 -  Chemotherapy Adjuvant chemotherapy with dose dense Adriamycin and Cytoxan 4 followed by Abraxane weekly 12    CHIEF COMPLIANT: Cycle 1 day 1 dose dense Adriamycin and Cytoxan  INTERVAL HISTORY: Misty Snyder is a 54 year old with above-mentioned history of right breast cancer treated with mastectomy triple negative disease and is here today to start adjuvant chemotherapy. She had been through chemotherapy education, fill her antiemetics.  REVIEW OF SYSTEMS:   Constitutional: Denies fevers, chills or abnormal weight loss Eyes: Denies blurriness of vision Ears, nose, mouth, throat, and face: Denies mucositis or sore throat Respiratory: Denies cough, dyspnea or wheezes Cardiovascular: Denies palpitation, chest discomfort or lower extremity swelling Gastrointestinal:  Denies nausea, heartburn or change in bowel habits Skin: Denies abnormal skin rashes Lymphatics: Denies new lymphadenopathy or easy bruising Neurological:Denies numbness, tingling or new weaknesses Behavioral/Psych: Mood is stable, no new changes   All other systems were reviewed with the patient and are  negative.  I have reviewed the past medical history, past surgical history, social history and family history with the patient and they are unchanged from previous note.  ALLERGIES:  has No Known Allergies.  MEDICATIONS:  Current Outpatient Prescriptions  Medication Sig Dispense Refill  . amphetamine-dextroamphetamine (ADDERALL) 30 MG tablet Take 1 tablet by mouth daily. 30 tablet 0  . citalopram (CELEXA) 40 MG tablet TAKE 1 TABLET (40 MG TOTAL) BY MOUTH DAILY. 90 tablet 3  . dexamethasone (DECADRON) 4 MG tablet Take 1 tablets by mouth once a day on the day after chemotherapy and then take 1 tablets two times a day for 2 days. Take with food. 30 tablet 1  . HYDROcodone-acetaminophen (VICODIN ES) 7.5-750 MG per tablet Half tab at bedtime as needed 30 tablet 2  . hydrOXYzine (ATARAX/VISTARIL) 50 MG tablet Take 50 mg by mouth 3 (three) times daily as needed (for sleep).    Marland Kitchen lidocaine-prilocaine (EMLA) cream Apply to affected area once 30 g 3  . LORazepam (ATIVAN) 0.5 MG tablet Take 1 tablet (0.5 mg total) by mouth at bedtime. 30 tablet 0  . ondansetron (ZOFRAN) 8 MG tablet Take 1 tablet (8 mg total) by mouth 2 (two) times daily as needed. Start on the third day after chemotherapy. 30 tablet 1  . oxyCODONE-acetaminophen (PERCOCET/ROXICET) 5-325 MG per tablet Take 1-2 tablets by mouth every 4 (four) hours as needed for moderate pain. 30 tablet 0  . prochlorperazine (COMPAZINE) 10 MG tablet Take 1 tablet (10 mg total) by mouth every 6 (six) hours as needed (Nausea or vomiting). 30 tablet 1   No current facility-administered medications for this  visit.    PHYSICAL EXAMINATION: ECOG PERFORMANCE STATUS: 1 - Symptomatic but completely ambulatory  Filed Vitals:   07/22/14 1115  BP: 118/74  Pulse: 82  Temp: 98.2 F (36.8 C)  Resp: 18   Filed Weights   07/22/14 1115  Weight: 185 lb 8 oz (84.142 kg)    GENERAL:alert, no distress and comfortable SKIN: skin color, texture, turgor are normal,  no rashes or significant lesions EYES: normal, Conjunctiva are pink and non-injected, sclera clear OROPHARYNX:no exudate, no erythema and lips, buccal mucosa, and tongue normal  NECK: supple, thyroid normal size, non-tender, without nodularity LYMPH:  no palpable lymphadenopathy in the cervical, axillary or inguinal LUNGS: clear to auscultation and percussion with normal breathing effort HEART: regular rate & rhythm and no murmurs and no lower extremity edema ABDOMEN:abdomen soft, non-tender and normal bowel sounds Musculoskeletal:no cyanosis of digits and no clubbing  NEURO: alert & oriented x 3 with fluent speech, no focal motor/sensory deficits  LABORATORY DATA:  I have reviewed the data as listed   Chemistry      Component Value Date/Time   NA 142 07/22/2014 1048   NA 136 06/18/2014 0400   K 3.8 07/22/2014 1048   K 3.6 06/18/2014 0400   CL 103 06/18/2014 0400   CO2 26 07/22/2014 1048   CO2 23 06/18/2014 0400   BUN 9.4 07/22/2014 1048   BUN 6 06/18/2014 0400   CREATININE 0.8 07/22/2014 1048   CREATININE 0.76 06/18/2014 0400      Component Value Date/Time   CALCIUM 9.6 07/22/2014 1048   CALCIUM 8.1* 06/18/2014 0400   ALKPHOS 67 07/22/2014 1048   ALKPHOS 66 02/10/2014 1555   AST 19 07/22/2014 1048   AST 16 02/10/2014 1555   ALT 20 07/22/2014 1048   ALT 13 02/10/2014 1555   BILITOT 0.30 07/22/2014 1048   BILITOT 0.3 02/10/2014 1555       Lab Results  Component Value Date   WBC 4.1 07/22/2014   HGB 12.6 07/22/2014   HCT 38.7 07/22/2014   MCV 81.8 07/22/2014   PLT 230 07/22/2014   NEUTROABS 2.4 07/22/2014     RADIOGRAPHIC STUDIES: I have personally reviewed the radiology reports and agreed with their findings. Echocardiogram EF 60-65%  ASSESSMENT & PLAN:  Breast cancer of upper-outer quadrant of right female breast Right breast mastectomy 06/17/2014: IDC, 3.6 cm, grade 3, 0/4 lymph nodes, T2 N0 M0 stage II a, ER 0%, PR 0%, HER-2 negative, Ki-67  90%  Treatment Plan: 1. Adjuvant chemo with dose dense Adriamycin and Cytoxan followed by Abraxane Current treatment: Cycle 1 day 1 of dose dense AC  Chemo monitoring 1. ECHO: 07/01/14: EF 60-65% 2. Anti-emetics were reviewed 3.Chemo education complete 4. Consent signed  RTC in 1 week for toxicity check   No orders of the defined types were placed in this encounter.   The patient has a good understanding of the overall plan. she agrees with it. she will call with any problems that may develop before the next visit here.   Rulon Eisenmenger, MD

## 2014-07-22 NOTE — Patient Instructions (Signed)
Doxorubicin injection What is this medicine? DOXORUBICIN (dox oh ROO bi sin) is a chemotherapy drug. It is used to treat many kinds of cancer like Hodgkin's disease, leukemia, non-Hodgkin's lymphoma, neuroblastoma, sarcoma, and Wilms' tumor. It is also used to treat bladder cancer, breast cancer, lung cancer, ovarian cancer, stomach cancer, and thyroid cancer. This medicine may be used for other purposes; ask your health care provider or pharmacist if you have questions. COMMON BRAND NAME(S): Adriamycin, Adriamycin PFS, Adriamycin RDF, Rubex What should I tell my health care provider before I take this medicine? They need to know if you have any of these conditions: -blood disorders -heart disease, recent heart attack -infection (especially a virus infection such as chickenpox, cold sores, or herpes) -irregular heartbeat -liver disease -recent or ongoing radiation therapy -an unusual or allergic reaction to doxorubicin, other chemotherapy agents, other medicines, foods, dyes, or preservatives -pregnant or trying to get pregnant -breast-feeding How should I use this medicine? This drug is given as an infusion into a vein. It is administered in a hospital or clinic by a specially trained health care professional. If you have pain, swelling, burning or any unusual feeling around the site of your injection, tell your health care professional right away. Talk to your pediatrician regarding the use of this medicine in children. Special care may be needed. Overdosage: If you think you have taken too much of this medicine contact a poison control center or emergency room at once. NOTE: This medicine is only for you. Do not share this medicine with others. What if I miss a dose? It is important not to miss your dose. Call your doctor or health care professional if you are unable to keep an appointment. What may interact with this medicine? Do not take this medicine with any of the following  medications: -cisapride -droperidol -halofantrine -pimozide -zidovudine This medicine may also interact with the following medications: -chloroquine -chlorpromazine -clarithromycin -cyclophosphamide -cyclosporine -erythromycin -medicines for depression, anxiety, or psychotic disturbances -medicines for irregular heart beat like amiodarone, bepridil, dofetilide, encainide, flecainide, propafenone, quinidine -medicines for seizures like ethotoin, fosphenytoin, phenytoin -medicines for nausea, vomiting like dolasetron, ondansetron, palonosetron -medicines to increase blood counts like filgrastim, pegfilgrastim, sargramostim -methadone -methotrexate -pentamidine -progesterone -vaccines -verapamil Talk to your doctor or health care professional before taking any of these medicines: -acetaminophen -aspirin -ibuprofen -ketoprofen -naproxen This list may not describe all possible interactions. Give your health care provider a list of all the medicines, herbs, non-prescription drugs, or dietary supplements you use. Also tell them if you smoke, drink alcohol, or use illegal drugs. Some items may interact with your medicine. What should I watch for while using this medicine? Your condition will be monitored carefully while you are receiving this medicine. You will need important blood work done while you are taking this medicine. This drug may make you feel generally unwell. This is not uncommon, as chemotherapy can affect healthy cells as well as cancer cells. Report any side effects. Continue your course of treatment even though you feel ill unless your doctor tells you to stop. Your urine may turn red for a few days after your dose. This is not blood. If your urine is dark or brown, call your doctor. In some cases, you may be given additional medicines to help with side effects. Follow all directions for their use. Call your doctor or health care professional for advice if you get a  fever, chills or sore throat, or other symptoms of a cold or flu. Do not   treat yourself. This drug decreases your body's ability to fight infections. Try to avoid being around people who are sick. This medicine may increase your risk to bruise or bleed. Call your doctor or health care professional if you notice any unusual bleeding. Be careful brushing and flossing your teeth or using a toothpick because you may get an infection or bleed more easily. If you have any dental work done, tell your dentist you are receiving this medicine. Avoid taking products that contain aspirin, acetaminophen, ibuprofen, naproxen, or ketoprofen unless instructed by your doctor. These medicines may hide a fever. Men and women of childbearing age should use effective birth control methods while using taking this medicine. Do not become pregnant while taking this medicine. There is a potential for serious side effects to an unborn child. Talk to your health care professional or pharmacist for more information. Do not breast-feed an infant while taking this medicine. Do not let others touch your urine or other body fluids for 5 days after each treatment with this medicine. Caregivers should wear latex gloves to avoid touching body fluids during this time. There is a maximum amount of this medicine you should receive throughout your life. The amount depends on the medical condition being treated and your overall health. Your doctor will watch how much of this medicine you receive in your lifetime. Tell your doctor if you have taken this medicine before. What side effects may I notice from receiving this medicine? Side effects that you should report to your doctor or health care professional as soon as possible: -allergic reactions like skin rash, itching or hives, swelling of the face, lips, or tongue -low blood counts - this medicine may decrease the number of white blood cells, red blood cells and platelets. You may be at  increased risk for infections and bleeding. -signs of infection - fever or chills, cough, sore throat, pain or difficulty passing urine -signs of decreased platelets or bleeding - bruising, pinpoint red spots on the skin, black, tarry stools, blood in the urine -signs of decreased red blood cells - unusually weak or tired, fainting spells, lightheadedness -breathing problems -chest pain -fast, irregular heartbeat -mouth sores -nausea, vomiting -pain, swelling, redness at site where injected -pain, tingling, numbness in the hands or feet -swelling of ankles, feet, or hands -unusual bleeding or bruising Side effects that usually do not require medical attention (report to your doctor or health care professional if they continue or are bothersome): -diarrhea -facial flushing -hair loss -loss of appetite -missed menstrual periods -nail discoloration or damage -red or watery eyes -red colored urine -stomach upset This list may not describe all possible side effects. Call your doctor for medical advice about side effects. You may report side effects to FDA at 1-800-FDA-1088. Where should I keep my medicine? This drug is given in a hospital or clinic and will not be stored at home. NOTE: This sheet is a summary. It may not cover all possible information. If you have questions about this medicine, talk to your doctor, pharmacist, or health care provider.  2015, [employer] Standard. (2012-04-30 09:54:34)  Cyclophosphamide injection What is this medicine? CYCLOPHOSPHAMIDE (sye kloe FOSS fa mide) is a chemotherapy drug. It slows the growth of cancer cells. This medicine is used to treat many types of cancer like lymphoma, myeloma, leukemia, breast cancer, and ovarian cancer, to name a few. This medicine may be used for other purposes; ask your health care provider or pharmacist if you have questions. COMMON BRAND NAME(S):   Cytoxan, Neosar What should I tell my health care provider before I  take this medicine? They need to know if you have any of these conditions: -blood disorders -history of other chemotherapy -infection -kidney disease -liver disease -recent or ongoing radiation therapy -tumors in the bone marrow -an unusual or allergic reaction to cyclophosphamide, other chemotherapy, other medicines, foods, dyes, or preservatives -pregnant or trying to get pregnant -breast-feeding How should I use this medicine? This drug is usually given as an injection into a vein or muscle or by infusion into a vein. It is administered in a hospital or clinic by a specially trained health care professional. Talk to your pediatrician regarding the use of this medicine in children. Special care may be needed. Overdosage: If you think you have taken too much of this medicine contact a poison control center or emergency room at once. NOTE: This medicine is only for you. Do not share this medicine with others. What if I miss a dose? It is important not to miss your dose. Call your doctor or health care professional if you are unable to keep an appointment. What may interact with this medicine? This medicine may interact with the following medications: -amiodarone -amphotericin B -azathioprine -certain antiviral medicines for HIV or AIDS such as protease inhibitors (e.g., indinavir, ritonavir) and zidovudine -certain blood pressure medications such as benazepril, captopril, enalapril, fosinopril, lisinopril, moexipril, monopril, perindopril, quinapril, ramipril, trandolapril -certain cancer medications such as anthracyclines (e.g., daunorubicin, doxorubicin), busulfan, cytarabine, paclitaxel, pentostatin, tamoxifen, trastuzumab -certain diuretics such as chlorothiazide, chlorthalidone, hydrochlorothiazide, indapamide, metolazone -certain medicines that treat or prevent blood clots like warfarin -certain muscle relaxants such as  succinylcholine -cyclosporine -etanercept -indomethacin -medicines to increase blood counts like filgrastim, pegfilgrastim, sargramostim -medicines used as general anesthesia -metronidazole -natalizumab This list may not describe all possible interactions. Give your health care provider a list of all the medicines, herbs, non-prescription drugs, or dietary supplements you use. Also tell them if you smoke, drink alcohol, or use illegal drugs. Some items may interact with your medicine. What should I watch for while using this medicine? Visit your doctor for checks on your progress. This drug may make you feel generally unwell. This is not uncommon, as chemotherapy can affect healthy cells as well as cancer cells. Report any side effects. Continue your course of treatment even though you feel ill unless your doctor tells you to stop. Drink water or other fluids as directed. Urinate often, even at night. In some cases, you may be given additional medicines to help with side effects. Follow all directions for their use. Call your doctor or health care professional for advice if you get a fever, chills or sore throat, or other symptoms of a cold or flu. Do not treat yourself. This drug decreases your body's ability to fight infections. Try to avoid being around people who are sick. This medicine may increase your risk to bruise or bleed. Call your doctor or health care professional if you notice any unusual bleeding. Be careful brushing and flossing your teeth or using a toothpick because you may get an infection or bleed more easily. If you have any dental work done, tell your dentist you are receiving this medicine. You may get drowsy or dizzy. Do not drive, use machinery, or do anything that needs mental alertness until you know how this medicine affects you. Do not become pregnant while taking this medicine or for 1 year after stopping it. Women should inform their doctor if they wish to become    pregnant or think they might be pregnant. Men should not father a child while taking this medicine and for 4 months after stopping it. There is a potential for serious side effects to an unborn child. Talk to your health care professional or pharmacist for more information. Do not breast-feed an infant while taking this medicine. This medicine may interfere with the ability to have a child. This medicine has caused ovarian failure in some women. This medicine has caused reduced sperm counts in some men. You should talk with your doctor or health care professional if you are concerned about your fertility. If you are going to have surgery, tell your doctor or health care professional that you have taken this medicine. What side effects may I notice from receiving this medicine? Side effects that you should report to your doctor or health care professional as soon as possible: -allergic reactions like skin rash, itching or hives, swelling of the face, lips, or tongue -low blood counts - this medicine may decrease the number of white blood cells, red blood cells and platelets. You may be at increased risk for infections and bleeding. -signs of infection - fever or chills, cough, sore throat, pain or difficulty passing urine -signs of decreased platelets or bleeding - bruising, pinpoint red spots on the skin, black, tarry stools, blood in the urine -signs of decreased red blood cells - unusually weak or tired, fainting spells, lightheadedness -breathing problems -dark urine -dizziness -palpitations -swelling of the ankles, feet, hands -trouble passing urine or change in the amount of urine -weight gain -yellowing of the eyes or skin Side effects that usually do not require medical attention (report to your doctor or health care professional if they continue or are bothersome): -changes in nail or skin color -hair loss -missed menstrual periods -mouth sores -nausea, vomiting This list may not  describe all possible side effects. Call your doctor for medical advice about side effects. You may report side effects to FDA at 1-800-FDA-1088. Where should I keep my medicine? This drug is given in a hospital or clinic and will not be stored at home. NOTE: This sheet is a summary. It may not cover all possible information. If you have questions about this medicine, talk to your doctor, pharmacist, or health care provider.  2015, [employer] Standard. (2011-11-17 16:22:58)  [facility] Discharge Instructions for Patients Receiving Chemotherapy  Today you received the following chemotherapy agents Adriamycin/Cytoxan.  To help prevent nausea and vomiting after your treatment, we encourage you to take your nausea medication as directed.   If you develop nausea and vomiting that is not controlled by your nausea medication, call the clinic.   BELOW ARE SYMPTOMS THAT SHOULD BE REPORTED IMMEDIATELY:  *FEVER GREATER THAN 100.5 F  *CHILLS WITH OR WITHOUT FEVER  NAUSEA AND VOMITING THAT IS NOT CONTROLLED WITH YOUR NAUSEA MEDICATION  *UNUSUAL SHORTNESS OF BREATH  *UNUSUAL BRUISING OR BLEEDING  TENDERNESS IN MOUTH AND THROAT WITH OR WITHOUT PRESENCE OF ULCERS  *URINARY PROBLEMS  *BOWEL PROBLEMS  UNUSUAL RASH Items with * indicate a potential emergency and should be followed up as soon as possible.  Feel free to call the clinic you have any questions or concerns. The clinic phone number is [phone].  Please show the [facility] at check-in to the Emergency Department and triage nurse.

## 2014-07-22 NOTE — Telephone Encounter (Signed)
Appointments made and avs printed for patient,can book 8;15 per dr Loleta Dicker

## 2014-07-22 NOTE — Progress Notes (Signed)
1205: Pt presented to infusion room slightly anxious about administration of chemotherapy today.  I discussed with pt how her time in the infusion room would go and what to expect.  We discussed the 2 medications she would be receiving and how they would be administered through her PAC.  I gave pt the OnPro video and answered questions she had regarding her in-home injection.  Pt signed consent and had no further questions prior to administering first premedication.  Will continue to monitor and answer questions/concerns should they arise.

## 2014-07-23 ENCOUNTER — Telehealth: Payer: Self-pay | Admitting: *Deleted

## 2014-07-23 NOTE — Telephone Encounter (Signed)
Spoke to pt concerning 1st chemo on 7/6. Relate she is doing well and denies needs.  Encourage pt to call with questions or concerns. Received verbal understanding.

## 2014-07-23 NOTE — Telephone Encounter (Signed)
1st A/C yesterday. Patient states she is doing fine, denies any nausea, vomiting or diarrhea. States she is eating and drinking well. Patient advised to call with any questions or concerns. She verbalized understanding.

## 2014-07-27 ENCOUNTER — Encounter: Payer: Self-pay | Admitting: Hematology and Oncology

## 2014-07-27 NOTE — Progress Notes (Signed)
I placed disabl forms 2  One [place] and fmla on desk of nurse of dr. Lindi Adie

## 2014-07-29 ENCOUNTER — Other Ambulatory Visit (HOSPITAL_BASED_OUTPATIENT_CLINIC_OR_DEPARTMENT_OTHER): Payer: BC Managed Care – PPO

## 2014-07-29 ENCOUNTER — Ambulatory Visit (HOSPITAL_BASED_OUTPATIENT_CLINIC_OR_DEPARTMENT_OTHER): Payer: BC Managed Care – PPO | Admitting: Hematology and Oncology

## 2014-07-29 ENCOUNTER — Telehealth: Payer: Self-pay | Admitting: Hematology and Oncology

## 2014-07-29 ENCOUNTER — Encounter: Payer: Self-pay | Admitting: Hematology and Oncology

## 2014-07-29 VITALS — BP 113/66 | HR 95 | Temp 98.8°F | Resp 19 | Ht 63.0 in | Wt 182.8 lb

## 2014-07-29 DIAGNOSIS — R198 Other specified symptoms and signs involving the digestive system and abdomen: Secondary | ICD-10-CM

## 2014-07-29 DIAGNOSIS — R110 Nausea: Secondary | ICD-10-CM

## 2014-07-29 DIAGNOSIS — C50411 Malignant neoplasm of upper-outer quadrant of right female breast: Secondary | ICD-10-CM | POA: Diagnosis not present

## 2014-07-29 DIAGNOSIS — D701 Agranulocytosis secondary to cancer chemotherapy: Secondary | ICD-10-CM

## 2014-07-29 DIAGNOSIS — R51 Headache: Secondary | ICD-10-CM

## 2014-07-29 DIAGNOSIS — R530 Neoplastic (malignant) related fatigue: Secondary | ICD-10-CM | POA: Diagnosis not present

## 2014-07-29 DIAGNOSIS — M791 Myalgia: Secondary | ICD-10-CM

## 2014-07-29 LAB — CBC WITH DIFFERENTIAL/PLATELET
BASO%: 0.6 % (ref 0.0–2.0)
Basophils Absolute: 0 10*3/uL (ref 0.0–0.1)
EOS ABS: 0 10*3/uL (ref 0.0–0.5)
EOS%: 2.3 % (ref 0.0–7.0)
HEMATOCRIT: 38.3 % (ref 34.8–46.6)
HGB: 12.3 g/dL (ref 11.6–15.9)
LYMPH%: 44.4 % (ref 14.0–49.7)
MCH: 26.3 pg (ref 25.1–34.0)
MCHC: 32.2 g/dL (ref 31.5–36.0)
MCV: 81.9 fL (ref 79.5–101.0)
MONO#: 0.1 10*3/uL (ref 0.1–0.9)
MONO%: 10.1 % (ref 0.0–14.0)
NEUT%: 42.6 % (ref 38.4–76.8)
NEUTROS ABS: 0.6 10*3/uL — AB (ref 1.5–6.5)
PLATELETS: 174 10*3/uL (ref 145–400)
RBC: 4.67 10*6/uL (ref 3.70–5.45)
RDW: 15.3 % — ABNORMAL HIGH (ref 11.2–14.5)
WBC: 1.3 10*3/uL — ABNORMAL LOW (ref 3.9–10.3)
lymph#: 0.6 10*3/uL — ABNORMAL LOW (ref 0.9–3.3)

## 2014-07-29 LAB — COMPREHENSIVE METABOLIC PANEL (CC13)
ALT: 11 U/L (ref 0–55)
AST: 12 U/L (ref 5–34)
Albumin: 3.6 g/dL (ref 3.5–5.0)
Alkaline Phosphatase: 82 U/L (ref 40–150)
Anion Gap: 9 mEq/L (ref 3–11)
BILIRUBIN TOTAL: 0.4 mg/dL (ref 0.20–1.20)
BUN: 12 mg/dL (ref 7.0–26.0)
CHLORIDE: 106 meq/L (ref 98–109)
CO2: 28 meq/L (ref 22–29)
CREATININE: 0.8 mg/dL (ref 0.6–1.1)
Calcium: 9.1 mg/dL (ref 8.4–10.4)
EGFR: >90 mL/min/{1.73_m2} (ref >90)
Glucose: 128 mg/dl (ref 70–140)
Potassium: 4 mEq/L (ref 3.5–5.1)
SODIUM: 143 meq/L (ref 136–145)
Total Protein: 7.2 g/dL (ref 6.4–8.3)

## 2014-07-29 NOTE — Telephone Encounter (Signed)
Pt confirmed labs/ov per 07/13 POF, gave pt AVS and Calendar.... KJ °

## 2014-07-29 NOTE — Assessment & Plan Note (Addendum)
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 Adjuvant chemo with dose dense Adriamycin and Cytoxan followed by Abraxane 2.  No role of radiation or antiestrogen therapy afterwards because she is ER/PR negative and she had mastectomy previously.  Current treatment: Today is Cycle 1 day 8 of dose dense AC  Chemo toxicities 1.  Headache related to Zofran : instructed her to stop Zofran 2.  Loose stools after eating food : encouraged her to take Imodium and to eat more roughage in diet. Also to add probiotics 3.  Body aches and pains: she was taking Claritin-D and instructed to add Tylenol to it. This is most likely related to Neulasta 4.  Fatigue related to chemotherapy : She is already starting to feel better for the past couple of days. 5.  Mild nausea without vomiting  RTC in 1 week for cycle 2

## 2014-07-29 NOTE — Progress Notes (Signed)
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, Ki-67 90%   05/20/2014 Breast MRI Right breast mass: 2.7 x 2.7 x 3.3 cm along with 3 cm non-mass enhancement, upper inner quadrant 8 mm mass, lower inner quadrant 6 mm mass, right axillary lymph nodes 1.2 cm, T2 bright liver lesions   06/17/2014 Surgery Right breast mastectomy: IDC, 3.6 cm, grade 3, 0/4 lymph nodes, T2 N0 M0 stage II a, ER 0%, PR 0%, HER-2 negative, Ki-67 90%   07/22/2014 -  Chemotherapy Adjuvant chemotherapy with dose dense Adriamycin and Cytoxan 4 followed by Abraxane weekly 12    CHIEF COMPLIANT:  Cycle 1 day 8 Adriamycin and Cytoxan toxicity check  INTERVAL HISTORY: Misty Snyder is a  54 year old above-mentioned history of right breast cancer treated with mastectomy and is now on adjuvant chemotherapy. She did not want to participate in the [facility] clinical trial and is here  for cycle 1 day 8 adjuvant chemotherapy.  She had headaches after chemotherapy related to Zofran. She also had loose stools that seem to happen with each food intake. Once or twice a day. She also complained of body aches that lasted 3-4 days after chemotherapy. She felt fatigued after chemotherapy but has improved energy levels since yesterday.  REVIEW OF SYSTEMS:   Constitutional: Denies fevers, chills or abnormal weight loss , complains of fatigue and headaches Eyes: Denies blurriness of vision Ears, nose, mouth, throat, and face: Denies mucositis or sore throat Respiratory: Denies cough, dyspnea or wheezes Cardiovascular: Denies palpitation, chest discomfort or lower  extremity swelling Gastrointestinal:   Mild nausea and diarrhea Skin: Denies abnormal skin rashes Lymphatics: Denies new lymphadenopathy or easy bruising Neurological:Denies numbness, tingling or new weaknesses Behavioral/Psych: Mood is stable, no new changes  All other systems were reviewed with the patient and are negative.  I have reviewed the past medical history, past surgical history, social history and family history with the patient and they are unchanged from previous note.  ALLERGIES:  has No Known Allergies.  MEDICATIONS:  Current Outpatient Prescriptions  Medication Sig Dispense Refill  . amphetamine-dextroamphetamine (ADDERALL) 30 MG tablet Take 1 tablet by mouth daily. 30 tablet 0  . citalopram (CELEXA) 40 MG tablet TAKE 1 TABLET (40 MG TOTAL) BY MOUTH DAILY. 90 tablet 3  . dexamethasone (DECADRON) 4 MG tablet Take 1 tablets by mouth once a day on the day after chemotherapy and then take 1 tablets two times a day for 2 days. Take with food. 30 tablet 1  . HYDROcodone-acetaminophen (VICODIN ES) 7.5-750 MG per tablet Half tab at bedtime as needed 30 tablet 2  . hydrOXYzine (ATARAX/VISTARIL) 50 MG tablet Take 50 mg by mouth 3 (three) times daily as needed (for sleep).    Marland Kitchen lidocaine-prilocaine (EMLA) cream Apply to affected area once 30 g 3  . LORazepam (ATIVAN) 0.5 MG tablet Take 1 tablet (0.5 mg total) by mouth at bedtime. 30 tablet 0  . ondansetron (ZOFRAN) 8 MG tablet Take 1 tablet (8 mg total) by mouth 2 (two) times daily as needed. Start on the third day after chemotherapy. [place]  tablet 1  . oxyCODONE-acetaminophen (PERCOCET/ROXICET) 5-325 MG per tablet Take 1-2 tablets by mouth every 4 (four) hours as needed for moderate pain. 30 tablet 0  . prochlorperazine (COMPAZINE) 10 MG tablet Take 1 tablet (10 mg total) by mouth every 6 (six) hours as needed (Nausea or vomiting). 30 tablet 1   No current facility-administered medications for this visit.    PHYSICAL  EXAMINATION: ECOG PERFORMANCE STATUS: 1 - Symptomatic but completely ambulatory  Filed Vitals:   07/29/14 0837  BP: 113/66  Pulse: 95  Temp: 98.8 F (37.1 C)  Resp: 19   Filed Weights   07/29/14 0837  Weight: 182 lb 12.8 oz (82.918 kg)    GENERAL:alert, no distress and comfortable SKIN: skin color, texture, turgor are normal, no rashes or significant lesions EYES: normal, Conjunctiva are pink and non-injected, sclera clear OROPHARYNX:no exudate, no erythema and lips, buccal mucosa, and tongue normal  NECK: supple, thyroid normal size, non-tender, without nodularity LYMPH:  no palpable lymphadenopathy in the cervical, axillary or inguinal LUNGS: clear to auscultation and percussion with normal breathing effort HEART: regular rate & rhythm and no murmurs and no lower extremity edema ABDOMEN:abdomen soft, non-tender and normal bowel sounds Musculoskeletal:no cyanosis of digits and no clubbing  NEURO: alert & oriented x 3 with fluent speech, no focal motor/sensory deficits LABORATORY DATA:  I have reviewed the data as listed   Chemistry      Component Value Date/Time   NA 143 07/29/2014 0826   NA 136 06/18/2014 0400   K 4.0 07/29/2014 0826   K 3.6 06/18/2014 0400   CL 103 06/18/2014 0400   CO2 28 07/29/2014 0826   CO2 23 06/18/2014 0400   BUN 12.0 07/29/2014 0826   BUN 6 06/18/2014 0400   CREATININE 0.8 07/29/2014 0826   CREATININE 0.76 06/18/2014 0400      Component Value Date/Time   CALCIUM 9.1 07/29/2014 0826   CALCIUM 8.1* 06/18/2014 0400   ALKPHOS 82 07/29/2014 0826   ALKPHOS 66 02/10/2014 1555   AST 12 07/29/2014 0826   AST 16 02/10/2014 1555   ALT 11 07/29/2014 0826   ALT 13 02/10/2014 1555   BILITOT 0.40 07/29/2014 0826   BILITOT 0.3 02/10/2014 1555       Lab Results  Component Value Date   WBC 1.3* 07/29/2014   HGB 12.3 07/29/2014   HCT 38.3 07/29/2014   MCV 81.9 07/29/2014   PLT 174 07/29/2014   NEUTROABS 0.6* 07/29/2014    ASSESSMENT & PLAN:   Breast cancer of upper-outer quadrant of right female breast Right breast mastectomy 06/17/2014: IDC, 3.6 cm, grade 3, 0/4 lymph nodes, T2 N0 M0 stage II a, ER 0%, PR 0%, HER-2 negative, Ki-67 90%  Treatment Plan: 1. Adjuvant chemo with dose dense Adriamycin and Cytoxan followed by Abraxane 2.  No role of radiation or antiestrogen therapy afterwards because she is ER/PR negative and she had mastectomy previously.  Current treatment: Today is Cycle 1 day 8 of dose dense AC  Chemo toxicities 1.  Headache related to Zofran : instructed her to stop Zofran 2.  Loose stools after eating food : encouraged her to take Imodium and to eat more roughage in diet. Also to add probiotics 3.  Body aches and pains: she was taking Claritin-D and instructed to add Tylenol to it. This is most likely related to Neulasta 4.  Fatigue related to chemotherapy : She is already starting to feel better for the past couple of days. 5.  Mild nausea without vomiting 6.  Grade 2 neutropenia: Expected for Nadir counts checked we'll keep the doses the same  RTC in 1 week for cycle 2   No orders of the defined types were placed in this encounter.   The patient has a good understanding of the overall plan. she agrees with it. she will call with any problems that may develop before the next visit here.   Rulon Eisenmenger, MD

## 2014-07-31 ENCOUNTER — Encounter: Payer: Self-pay | Admitting: Hematology and Oncology

## 2014-07-31 ENCOUNTER — Telehealth: Payer: Self-pay | Admitting: *Deleted

## 2014-07-31 NOTE — Telephone Encounter (Signed)
Spoke with pt concerning neutropenia precautions. Received verbal understanding.Encourage pt to call with further questions or needs.

## 2014-07-31 NOTE — Progress Notes (Signed)
Per Terri she faxed forms for fmla [phone] [id number] and [phone] disabl rms/one [place]

## 2014-08-03 ENCOUNTER — Encounter: Payer: Self-pay | Admitting: Gastroenterology

## 2014-08-04 NOTE — Assessment & Plan Note (Signed)
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 Adjuvant chemo with dose dense Adriamycin and Cytoxan followed by Abraxane weekly x 12 2. No role of radiation or antiestrogen therapy afterwards because she is ER/PR negative and she had mastectomy previously.  Current treatment: Today is Cycle 2 day 1 of dose dense AC  Chemo toxicities 1. Headache related to Zofran : instructed her to stop Zofran 2. Loose stools after eating food : encouraged her to take Imodium and to eat more roughage in diet. Also to add probiotics 3. Body aches and pains: she was taking Claritin-D and instructed to add Tylenol to it. This is most likely related to Neulasta 4. Fatigue related to chemotherapy : She is already starting to feel better for the past couple of days. 5. Mild nausea without vomiting 6. Grade 2 neutropenia: Expected for Nadir counts checked we'll keep the doses the same  RTC in 1 week for cycle 3

## 2014-08-05 ENCOUNTER — Ambulatory Visit (HOSPITAL_BASED_OUTPATIENT_CLINIC_OR_DEPARTMENT_OTHER): Payer: BC Managed Care – PPO

## 2014-08-05 ENCOUNTER — Other Ambulatory Visit: Payer: Self-pay | Admitting: *Deleted

## 2014-08-05 ENCOUNTER — Other Ambulatory Visit (HOSPITAL_BASED_OUTPATIENT_CLINIC_OR_DEPARTMENT_OTHER): Payer: BC Managed Care – PPO

## 2014-08-05 ENCOUNTER — Telehealth: Payer: Self-pay | Admitting: *Deleted

## 2014-08-05 ENCOUNTER — Encounter: Payer: Self-pay | Admitting: Hematology and Oncology

## 2014-08-05 ENCOUNTER — Ambulatory Visit (HOSPITAL_BASED_OUTPATIENT_CLINIC_OR_DEPARTMENT_OTHER): Payer: BC Managed Care – PPO | Admitting: Hematology and Oncology

## 2014-08-05 ENCOUNTER — Telehealth: Payer: Self-pay | Admitting: Hematology and Oncology

## 2014-08-05 VITALS — BP 114/89 | HR 92 | Temp 98.3°F | Resp 18 | Ht 63.0 in | Wt 184.9 lb

## 2014-08-05 DIAGNOSIS — D701 Agranulocytosis secondary to cancer chemotherapy: Secondary | ICD-10-CM | POA: Diagnosis not present

## 2014-08-05 DIAGNOSIS — R51 Headache: Secondary | ICD-10-CM

## 2014-08-05 DIAGNOSIS — C50411 Malignant neoplasm of upper-outer quadrant of right female breast: Secondary | ICD-10-CM

## 2014-08-05 DIAGNOSIS — R530 Neoplastic (malignant) related fatigue: Secondary | ICD-10-CM | POA: Diagnosis not present

## 2014-08-05 DIAGNOSIS — Z5111 Encounter for antineoplastic chemotherapy: Secondary | ICD-10-CM | POA: Diagnosis not present

## 2014-08-05 DIAGNOSIS — R112 Nausea with vomiting, unspecified: Secondary | ICD-10-CM

## 2014-08-05 DIAGNOSIS — R198 Other specified symptoms and signs involving the digestive system and abdomen: Secondary | ICD-10-CM

## 2014-08-05 DIAGNOSIS — R52 Pain, unspecified: Secondary | ICD-10-CM

## 2014-08-05 LAB — COMPREHENSIVE METABOLIC PANEL (CC13)
ALT: 14 U/L (ref 0–55)
AST: 14 U/L (ref 5–34)
Albumin: 3.6 g/dL (ref 3.5–5.0)
Alkaline Phosphatase: 78 U/L (ref 40–150)
Anion Gap: 10 meq/L (ref 3–11)
BUN: 12.1 mg/dL (ref 7.0–26.0)
CO2: 26 meq/L (ref 22–29)
Calcium: 9.1 mg/dL (ref 8.4–10.4)
Chloride: 104 meq/L (ref 98–109)
Creatinine: 0.7 mg/dL (ref 0.6–1.1)
EGFR: >90 ml/min/1.73 m2
Glucose: 116 mg/dL (ref 70–140)
Potassium: 3.5 meq/L (ref 3.5–5.1)
Sodium: 140 meq/L (ref 136–145)
Total Bilirubin: <0.2 mg/dL (ref 0.20–1.20)
Total Protein: 7.2 g/dL (ref 6.4–8.3)

## 2014-08-05 LAB — CBC WITH DIFFERENTIAL/PLATELET
BASO%: 0.2 % (ref 0.0–2.0)
Basophils Absolute: 0 10*3/uL (ref 0.0–0.1)
EOS ABS: 0 10*3/uL (ref 0.0–0.5)
EOS%: 0.4 % (ref 0.0–7.0)
HCT: 36.1 % (ref 34.8–46.6)
HGB: 11.6 g/dL (ref 11.6–15.9)
LYMPH#: 0.9 10*3/uL (ref 0.9–3.3)
LYMPH%: 17.3 % (ref 14.0–49.7)
MCH: 26.4 pg (ref 25.1–34.0)
MCHC: 32.1 g/dL (ref 31.5–36.0)
MCV: 82.2 fL (ref 79.5–101.0)
MONO#: 0.7 10*3/uL (ref 0.1–0.9)
MONO%: 12.4 % (ref 0.0–14.0)
NEUT#: 3.7 10*3/uL (ref 1.5–6.5)
NEUT%: 69.7 % (ref 38.4–76.8)
Platelets: 273 10*3/uL (ref 145–400)
RBC: 4.39 10*6/uL (ref 3.70–5.45)
RDW: 16.1 % — ABNORMAL HIGH (ref 11.2–14.5)
WBC: 5.3 10*3/uL (ref 3.9–10.3)

## 2014-08-05 MED ORDER — SODIUM CHLORIDE 0.9 % IV SOLN
Freq: Once | INTRAVENOUS | Status: AC
  Administered 2014-08-05: 13:00:00 via INTRAVENOUS
  Filled 2014-08-05: qty 5

## 2014-08-05 MED ORDER — DEXAMETHASONE 4 MG PO TABS: ORAL_TABLET | ORAL | Status: DC

## 2014-08-05 MED ORDER — PALONOSETRON HCL INJECTION 0.25 MG/5ML
0.2500 mg | Freq: Once | INTRAVENOUS | Status: AC
  Administered 2014-08-05: 0.25 mg via INTRAVENOUS

## 2014-08-05 MED ORDER — SODIUM CHLORIDE 0.9 % IV SOLN
Freq: Once | INTRAVENOUS | Status: AC
  Administered 2014-08-05: 13:00:00 via INTRAVENOUS

## 2014-08-05 MED ORDER — PALONOSETRON HCL INJECTION 0.25 MG/5ML
INTRAVENOUS | Status: AC
  Filled 2014-08-05: qty 5

## 2014-08-05 MED ORDER — SODIUM CHLORIDE 0.9 % IV SOLN
600.0000 mg/m2 | Freq: Once | INTRAVENOUS | Status: AC
  Administered 2014-08-05: 1160 mg via INTRAVENOUS
  Filled 2014-08-05: qty 58

## 2014-08-05 MED ORDER — SODIUM CHLORIDE 0.9 % IJ SOLN
10.0000 mL | INTRAMUSCULAR | Status: DC | PRN
  Administered 2014-08-05: 10 mL
  Filled 2014-08-05: qty 10

## 2014-08-05 MED ORDER — PEGFILGRASTIM 6 MG/0.6ML ~~LOC~~ PSKT
6.0000 mg | PREFILLED_SYRINGE | Freq: Once | SUBCUTANEOUS | Status: AC
  Administered 2014-08-05: 6 mg via SUBCUTANEOUS
  Filled 2014-08-05: qty 0.6

## 2014-08-05 MED ORDER — HEPARIN SOD (PORK) LOCK FLUSH 100 UNIT/ML IV SOLN
500.0000 [IU] | Freq: Once | INTRAVENOUS | Status: AC | PRN
  Administered 2014-08-05: 500 [IU]
  Filled 2014-08-05: qty 5

## 2014-08-05 MED ORDER — DOXORUBICIN HCL CHEMO IV INJECTION 2 MG/ML
60.0000 mg/m2 | Freq: Once | INTRAVENOUS | Status: AC
  Administered 2014-08-05: 116 mg via INTRAVENOUS
  Filled 2014-08-05: qty 58

## 2014-08-05 NOTE — Progress Notes (Signed)
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, Ki-67 90%   05/20/2014 Breast MRI Right breast mass: 2.7 x 2.7 x 3.3 cm along with 3 cm non-mass enhancement, upper inner quadrant 8 mm mass, lower inner quadrant 6 mm mass, right axillary lymph nodes 1.2 cm, T2 bright liver lesions   06/17/2014 Surgery Right breast mastectomy: IDC, 3.6 cm, grade 3, 0/4 lymph nodes, T2 N0 M0 stage II a, ER 0%, PR 0%, HER-2 negative, Ki-67 90%   07/22/2014 -  Chemotherapy Adjuvant chemotherapy with dose dense Adriamycin and Cytoxan 4 followed by Abraxane weekly 12    CHIEF COMPLIANT: Cycle 2 Adriamycin and Cytoxan  INTERVAL HISTORY: Misty Snyder is a 54 year old with above-mentioned history of right breast cancer currently on adjuvant chemotherapy and today's cycle 2 of treatment. Last week she felt remarkably well. She had no problems denies any nausea vomiting headaches or dizziness. She went to [facility] and had a wonderful time there.  REVIEW OF SYSTEMS:   Constitutional: Denies fevers, chills or abnormal weight loss, alopecia  Eyes: Denies blurriness of vision Ears, nose, mouth, throat, and face: Denies mucositis or sore throat Respiratory: Denies cough, dyspnea or wheezes Cardiovascular: Denies palpitation, chest discomfort or lower extremity swelling Gastrointestinal:  Denies nausea, heartburn or change in bowel habits Skin: Denies abnormal skin rashes Lymphatics: Denies new lymphadenopathy or easy bruising Neurological:Denies numbness, tingling or new weaknesses Behavioral/Psych: Mood is stable, no new  changes  Breast:  denies any pain or lumps or nodules in either breasts All other systems were reviewed with the patient and are negative.  I have reviewed the past medical history, past surgical history, social history and family history with the patient and they are unchanged from previous note.  ALLERGIES:  has No Known Allergies.  MEDICATIONS:  Current Outpatient Prescriptions  Medication Sig Dispense Refill  . amphetamine-dextroamphetamine (ADDERALL) 30 MG tablet Take 1 tablet by mouth daily. 30 tablet 0  . citalopram (CELEXA) 40 MG tablet TAKE 1 TABLET (40 MG TOTAL) BY MOUTH DAILY. 90 tablet 3  . dexamethasone (DECADRON) 4 MG tablet Take 1 tablets by mouth once a day on the day after chemotherapy and then take 1 tablets two times a day for 2 days. Take with food. 30 tablet 1  . HYDROcodone-acetaminophen (VICODIN ES) 7.5-750 MG per tablet Half tab at bedtime as needed 30 tablet 2  . hydrOXYzine (ATARAX/VISTARIL) 50 MG tablet Take 50 mg by mouth 3 (three) times daily as needed (for sleep).    Marland Kitchen lidocaine-prilocaine (EMLA) cream Apply to affected area once 30 g 3  . LORazepam (ATIVAN) 0.5 MG tablet Take 1 tablet (0.5 mg total) by mouth at bedtime. 30 tablet 0  . oxyCODONE-acetaminophen (PERCOCET/ROXICET) 5-325 MG per tablet Take 1-2 tablets by mouth every 4 (four) hours as needed for moderate pain. 30 tablet 0  . prochlorperazine (COMPAZINE) 10 MG tablet Take 1 tablet (10 mg total) by mouth every 6 (six) hours as needed (Nausea or vomiting). 30 tablet 1  . ondansetron (ZOFRAN) 8 MG tablet Take 1 tablet (8 mg total) by  mouth 2 (two) times daily as needed. Start on the third day after chemotherapy. (Patient not taking: Reported on 08/05/2014) 30 tablet 1   No current facility-administered medications for this visit.    PHYSICAL EXAMINATION: ECOG PERFORMANCE STATUS: 1 - Symptomatic but completely ambulatory  Filed Vitals:   08/05/14 1204  BP: 114/89  Pulse: 92  Temp: 98.3 F (36.8 C)   Resp: 18   Filed Weights   08/05/14 1204  Weight: 184 lb 14.4 oz (83.87 kg)    GENERAL:alert, no distress and comfortable SKIN: skin color, texture, turgor are normal, no rashes or significant lesions EYES: normal, Conjunctiva are pink and non-injected, sclera clear OROPHARYNX:no exudate, no erythema and lips, buccal mucosa, and tongue normal  NECK: supple, thyroid normal size, non-tender, without nodularity LYMPH:  no palpable lymphadenopathy in the cervical, axillary or inguinal LUNGS: clear to auscultation and percussion with normal breathing effort HEART: regular rate & rhythm and no murmurs and no lower extremity edema ABDOMEN:abdomen soft, non-tender and normal bowel sounds Musculoskeletal:no cyanosis of digits and no clubbing  NEURO: alert & oriented x 3 with fluent speech, no focal motor/sensory deficits  LABORATORY DATA:  I have reviewed the data as listed   Chemistry      Component Value Date/Time   NA 143 07/29/2014 0826   NA 136 06/18/2014 0400   K 4.0 07/29/2014 0826   K 3.6 06/18/2014 0400   CL 103 06/18/2014 0400   CO2 28 07/29/2014 0826   CO2 23 06/18/2014 0400   BUN 12.0 07/29/2014 0826   BUN 6 06/18/2014 0400   CREATININE 0.8 07/29/2014 0826   CREATININE 0.76 06/18/2014 0400      Component Value Date/Time   CALCIUM 9.1 07/29/2014 0826   CALCIUM 8.1* 06/18/2014 0400   ALKPHOS 82 07/29/2014 0826   ALKPHOS 66 02/10/2014 1555   AST 12 07/29/2014 0826   AST 16 02/10/2014 1555   ALT 11 07/29/2014 0826   ALT 13 02/10/2014 1555   BILITOT 0.40 07/29/2014 0826   BILITOT 0.3 02/10/2014 1555       Lab Results  Component Value Date   WBC 5.3 08/05/2014   HGB 11.6 08/05/2014   HCT 36.1 08/05/2014   MCV 82.2 08/05/2014   PLT 273 08/05/2014   NEUTROABS 3.7 08/05/2014   ASSESSMENT & PLAN:  Breast cancer of upper-outer quadrant of right female breast Right breast mastectomy 06/17/2014: IDC, 3.6 cm, grade 3, 0/4 lymph nodes, T2 N0 M0 stage II a, ER 0%,  PR 0%, HER-2 negative, Ki-67 90%  Treatment Plan: 1. Adjuvant chemo with dose dense Adriamycin and Cytoxan followed by Abraxane weekly x 12 2. No role of radiation or antiestrogen therapy afterwards because she is ER/PR negative and she had mastectomy previously.  Current treatment: Today is Cycle 2 day 1 of dose dense AC  Chemo toxicities 1. Headache related to Zofran : instructed her to stop Zofran 2. Loose stools after eating food : encouraged her to take Imodium and to eat more roughage in diet. Also to add probiotics 3. Body aches and pains: she was taking Claritin-D and instructed to add Tylenol to it. This is most likely related to Neulasta 4. Fatigue related to chemotherapy : She is already starting to feel better for the past couple of days. 5. Mild nausea without vomiting 6. Grade 2 neutropenia: Expected for Nadir counts checked we'll keep the doses the same  RTC in 1 week for cycle 3     No orders of  the defined types were placed in this encounter.   The patient has a good understanding of the overall plan. she agrees with it. she will call with any problems that may develop before the next visit here.   Rulon Eisenmenger, MD

## 2014-08-05 NOTE — Telephone Encounter (Signed)
Misty Snyder with [facility] on [address] called reporting "patient says Dr.Gudena sent order in June for Dexamethasone.  We do not have this order"  Order reads sent to [facility] at [place].  Order not found at [facility].  Due to error in eRx this nurse re-ordered.  Order has not and will not be duplicate to patient profile.

## 2014-08-05 NOTE — Patient Instructions (Signed)
[facility] Discharge Instructions for Patients Receiving Chemotherapy  Today you received the following chemotherapy agents:Adriamycin and Cytoxan   To help prevent nausea and vomiting after your treatment, we encourage you to take your nausea medication as directed.    If you develop nausea and vomiting that is not controlled by your nausea medication, call the clinic.   BELOW ARE SYMPTOMS THAT SHOULD BE REPORTED IMMEDIATELY:  *FEVER GREATER THAN 100.5 F  *CHILLS WITH OR WITHOUT FEVER  NAUSEA AND VOMITING THAT IS NOT CONTROLLED WITH YOUR NAUSEA MEDICATION  *UNUSUAL SHORTNESS OF BREATH  *UNUSUAL BRUISING OR BLEEDING  TENDERNESS IN MOUTH AND THROAT WITH OR WITHOUT PRESENCE OF ULCERS  *URINARY PROBLEMS  *BOWEL PROBLEMS  UNUSUAL RASH Items with * indicate a potential emergency and should be followed up as soon as possible.  Feel free to call the clinic you have any questions or concerns. The clinic phone number is [phone].  Please show the CHEMO ALERT CARD at check-in to the Emergency Department and triage nurse.   

## 2014-08-05 NOTE — Telephone Encounter (Signed)
No answer to confirm appointment 08/03. MyChart Message sent

## 2014-08-05 NOTE — Telephone Encounter (Signed)
Pt had chemo today.  Pt called to clarify instructions for taking antiemetics.  Went over instructions for Dexamethasone, Lorazepam, and Prochlorperazine with pt.  Reinforced needs to increase po fluids intake.  Pt voiced understanding.

## 2014-08-19 ENCOUNTER — Ambulatory Visit (HOSPITAL_BASED_OUTPATIENT_CLINIC_OR_DEPARTMENT_OTHER): Payer: BC Managed Care – PPO | Admitting: Hematology and Oncology

## 2014-08-19 ENCOUNTER — Telehealth: Payer: Self-pay | Admitting: Hematology and Oncology

## 2014-08-19 ENCOUNTER — Ambulatory Visit (HOSPITAL_BASED_OUTPATIENT_CLINIC_OR_DEPARTMENT_OTHER): Payer: BC Managed Care – PPO

## 2014-08-19 ENCOUNTER — Encounter: Payer: Self-pay | Admitting: Hematology and Oncology

## 2014-08-19 ENCOUNTER — Other Ambulatory Visit (HOSPITAL_BASED_OUTPATIENT_CLINIC_OR_DEPARTMENT_OTHER): Payer: BC Managed Care – PPO

## 2014-08-19 VITALS — BP 113/72 | HR 89 | Temp 98.8°F | Resp 18 | Ht 63.0 in | Wt 187.0 lb

## 2014-08-19 DIAGNOSIS — D701 Agranulocytosis secondary to cancer chemotherapy: Secondary | ICD-10-CM | POA: Diagnosis not present

## 2014-08-19 DIAGNOSIS — Z5111 Encounter for antineoplastic chemotherapy: Secondary | ICD-10-CM | POA: Diagnosis not present

## 2014-08-19 DIAGNOSIS — R51 Headache: Secondary | ICD-10-CM

## 2014-08-19 DIAGNOSIS — C50411 Malignant neoplasm of upper-outer quadrant of right female breast: Secondary | ICD-10-CM

## 2014-08-19 DIAGNOSIS — R112 Nausea with vomiting, unspecified: Secondary | ICD-10-CM

## 2014-08-19 DIAGNOSIS — R530 Neoplastic (malignant) related fatigue: Secondary | ICD-10-CM

## 2014-08-19 DIAGNOSIS — R198 Other specified symptoms and signs involving the digestive system and abdomen: Secondary | ICD-10-CM

## 2014-08-19 DIAGNOSIS — Z171 Estrogen receptor negative status [ER-]: Secondary | ICD-10-CM

## 2014-08-19 LAB — CBC WITH DIFFERENTIAL/PLATELET
BASO%: 0.3 % (ref 0.0–2.0)
Basophils Absolute: 0 10*3/uL (ref 0.0–0.1)
EOS%: 0.1 % (ref 0.0–7.0)
Eosinophils Absolute: 0 10*3/uL (ref 0.0–0.5)
HEMATOCRIT: 34.7 % — AB (ref 34.8–46.6)
HGB: 11.3 g/dL — ABNORMAL LOW (ref 11.6–15.9)
LYMPH#: 0.5 10*3/uL — AB (ref 0.9–3.3)
LYMPH%: 9.5 % — ABNORMAL LOW (ref 14.0–49.7)
MCH: 26.7 pg (ref 25.1–34.0)
MCHC: 32.6 g/dL (ref 31.5–36.0)
MCV: 81.8 fL (ref 79.5–101.0)
MONO#: 0.5 10*3/uL (ref 0.1–0.9)
MONO%: 9 % (ref 0.0–14.0)
NEUT#: 4.5 10*3/uL (ref 1.5–6.5)
NEUT%: 81.1 % — ABNORMAL HIGH (ref 38.4–76.8)
Platelets: 178 10*3/uL (ref 145–400)
RBC: 4.25 10*6/uL (ref 3.70–5.45)
RDW: 16 % — AB (ref 11.2–14.5)
WBC: 5.6 10*3/uL (ref 3.9–10.3)

## 2014-08-19 LAB — COMPREHENSIVE METABOLIC PANEL (CC13)
ALK PHOS: 84 U/L (ref 40–150)
ALT: 24 U/L (ref 0–55)
AST: 15 U/L (ref 5–34)
Albumin: 3.7 g/dL (ref 3.5–5.0)
Anion Gap: 9 mEq/L (ref 3–11)
BUN: 5.6 mg/dL — ABNORMAL LOW (ref 7.0–26.0)
CO2: 26 mEq/L (ref 22–29)
Calcium: 9.1 mg/dL (ref 8.4–10.4)
Chloride: 106 mEq/L (ref 98–109)
Creatinine: 0.7 mg/dL (ref 0.6–1.1)
Glucose: 124 mg/dl (ref 70–140)
POTASSIUM: 3.8 meq/L (ref 3.5–5.1)
Sodium: 141 mEq/L (ref 136–145)
Total Bilirubin: 0.2 mg/dL (ref 0.20–1.20)
Total Protein: 7 g/dL (ref 6.4–8.3)

## 2014-08-19 MED ORDER — PALONOSETRON HCL INJECTION 0.25 MG/5ML
INTRAVENOUS | Status: AC
  Filled 2014-08-19: qty 5

## 2014-08-19 MED ORDER — HEPARIN SOD (PORK) LOCK FLUSH 100 UNIT/ML IV SOLN
500.0000 [IU] | Freq: Once | INTRAVENOUS | Status: AC | PRN
  Administered 2014-08-19: 500 [IU]
  Filled 2014-08-19: qty 5

## 2014-08-19 MED ORDER — CYCLOPHOSPHAMIDE CHEMO INJECTION 1 GM
500.0000 mg/m2 | Freq: Once | INTRAMUSCULAR | Status: AC
  Administered 2014-08-19: 960 mg via INTRAVENOUS
  Filled 2014-08-19: qty 48

## 2014-08-19 MED ORDER — SODIUM CHLORIDE 0.9 % IV SOLN
Freq: Once | INTRAVENOUS | Status: AC
  Administered 2014-08-19: 14:00:00 via INTRAVENOUS
  Filled 2014-08-19: qty 5

## 2014-08-19 MED ORDER — PALONOSETRON HCL INJECTION 0.25 MG/5ML
0.2500 mg | Freq: Once | INTRAVENOUS | Status: AC
  Administered 2014-08-19: 0.25 mg via INTRAVENOUS

## 2014-08-19 MED ORDER — SODIUM CHLORIDE 0.9 % IV SOLN
Freq: Once | INTRAVENOUS | Status: AC
  Administered 2014-08-19: 13:00:00 via INTRAVENOUS

## 2014-08-19 MED ORDER — PEGFILGRASTIM 6 MG/0.6ML ~~LOC~~ PSKT
6.0000 mg | PREFILLED_SYRINGE | Freq: Once | SUBCUTANEOUS | Status: AC
  Administered 2014-08-19: 6 mg via SUBCUTANEOUS
  Filled 2014-08-19: qty 0.6

## 2014-08-19 MED ORDER — SODIUM CHLORIDE 0.9 % IJ SOLN
10.0000 mL | INTRAMUSCULAR | Status: DC | PRN
  Administered 2014-08-19: 10 mL
  Filled 2014-08-19: qty 10

## 2014-08-19 MED ORDER — DOXORUBICIN HCL CHEMO IV INJECTION 2 MG/ML
50.0000 mg/m2 | Freq: Once | INTRAVENOUS | Status: AC
  Administered 2014-08-19: 96 mg via INTRAVENOUS
  Filled 2014-08-19: qty 48

## 2014-08-19 NOTE — Progress Notes (Signed)
I placed fmla forms on desk of nurse for dr. Lindi Adie

## 2014-08-19 NOTE — Progress Notes (Signed)
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, Ki-67 90%   05/20/2014 Breast MRI Right breast mass: 2.7 x 2.7 x 3.3 cm along with 3 cm non-mass enhancement, upper inner quadrant 8 mm mass, lower inner quadrant 6 mm mass, right axillary lymph nodes 1.2 cm, T2 bright liver lesions   06/17/2014 Surgery Right breast mastectomy: IDC, 3.6 cm, grade 3, 0/4 lymph nodes, T2 N0 M0 stage II a, ER 0%, PR 0%, HER-2 negative, Ki-67 90%   07/22/2014 -  Chemotherapy Adjuvant chemotherapy with dose dense Adriamycin and Cytoxan 4 followed by Abraxane weekly 12    CHIEF COMPLIANT: Cycle 3 dose dense Adriamycin Cytoxan  INTERVAL HISTORY: Misty Snyder is a 54 year old with above-mentioned history of right-sided breast cancer underwent mastectomy has not had 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 She is here today for cycle 3 of chemotherapy. For the first 2 cycles she had issues with diarrhea, headache, body aches and fatigue as well as mild nausea. She felt profound fatigue that lasted for a whole week after last chemotherapy to the point that she felt that she was paralyzed and she does not want to do anything the whole week. She is starting to feel better over the past 4-5 days. She is requesting a dose reduction on the chemotherapy.  REVIEW OF SYSTEMS:   Constitutional: Denies fevers, chills or abnormal weight loss, severe fatigue, loss of taste Eyes: Denies blurriness of vision Ears, nose, mouth, throat, and face: Denies mucositis or sore throat Respiratory: Denies cough, dyspnea or wheezes Cardiovascular: Denies  palpitation, chest discomfort or lower extremity swelling Gastrointestinal:  Denies nausea, heartburn or change in bowel habits Skin: Denies abnormal skin rashes Lymphatics: Denies new lymphadenopathy or easy bruising Neurological:Denies numbness, tingling or new weaknesses Behavioral/Psych: Mood is stable, no new changes  Breast:  denies any pain or lumps or nodules in either breasts All other systems were reviewed with the patient and are negative.  I have reviewed the past medical history, past surgical history, social history and family history with the patient and they are unchanged from previous note.  ALLERGIES:  has No Known Allergies.  MEDICATIONS:  Current Outpatient Prescriptions  Medication Sig Dispense Refill  . amphetamine-dextroamphetamine (ADDERALL) 30 MG tablet Take 1 tablet by mouth daily. 30 tablet 0  . citalopram (CELEXA) 40 MG tablet TAKE 1 TABLET (40 MG TOTAL) BY MOUTH DAILY. 90 tablet 3  . dexamethasone (DECADRON) 4 MG tablet Take 1 tablets by mouth once a day on the day after chemotherapy and then take 1 tablets two times a day for 2 days. Take with food. 30 tablet 1  . HYDROcodone-acetaminophen (VICODIN ES) 7.5-750 MG per tablet Half tab at bedtime as needed 30 tablet 2  . hydrOXYzine (ATARAX/VISTARIL) 50 MG tablet Take 50 mg by mouth 3 (three) times daily as needed (for sleep).    Marland Kitchen lidocaine-prilocaine (EMLA) cream Apply to affected area once 30 g 3  . LORazepam (ATIVAN) 0.5 MG tablet Take 1 tablet (0.5 mg total) by mouth at bedtime. 30 tablet 0  . ondansetron (ZOFRAN) 8 MG tablet  Take 1 tablet (8 mg total) by mouth 2 (two) times daily as needed. Start on the third day after chemotherapy. (Patient not taking: Reported on 08/05/2014) 30 tablet 1  . oxyCODONE-acetaminophen (PERCOCET/ROXICET) 5-325 MG per tablet Take 1-2 tablets by mouth every 4 (four) hours as needed for moderate pain. 30 tablet 0  . prochlorperazine (COMPAZINE) 10 MG tablet Take 1 tablet (10 mg  total) by mouth every 6 (six) hours as needed (Nausea or vomiting). 30 tablet 1   No current facility-administered medications for this visit.    PHYSICAL EXAMINATION: ECOG PERFORMANCE STATUS: 1 - Symptomatic but completely ambulatory  There were no vitals filed for this visit. There were no vitals filed for this visit.  GENERAL:alert, no distress and comfortable SKIN: skin color, texture, turgor are normal, no rashes or significant lesions EYES: normal, Conjunctiva are pink and non-injected, sclera clear OROPHARYNX:no exudate, no erythema and lips, buccal mucosa, and tongue normal  NECK: supple, thyroid normal size, non-tender, without nodularity LYMPH:  no palpable lymphadenopathy in the cervical, axillary or inguinal LUNGS: clear to auscultation and percussion with normal breathing effort HEART: regular rate & rhythm and no murmurs and no lower extremity edema ABDOMEN:abdomen soft, non-tender and normal bowel sounds Musculoskeletal:no cyanosis of digits and no clubbing  NEURO: alert & oriented x 3 with fluent speech, no focal motor/sensory deficits  LABORATORY DATA:  I have reviewed the data as listed   Chemistry      Component Value Date/Time   NA 141 08/19/2014 1005   NA 136 06/18/2014 0400   K 3.8 08/19/2014 1005   K 3.6 06/18/2014 0400   CL 103 06/18/2014 0400   CO2 26 08/19/2014 1005   CO2 23 06/18/2014 0400   BUN 5.6* 08/19/2014 1005   BUN 6 06/18/2014 0400   CREATININE 0.7 08/19/2014 1005   CREATININE 0.76 06/18/2014 0400      Component Value Date/Time   CALCIUM 9.1 08/19/2014 1005   CALCIUM 8.1* 06/18/2014 0400   ALKPHOS 84 08/19/2014 1005   ALKPHOS 66 02/10/2014 1555   AST 15 08/19/2014 1005   AST 16 02/10/2014 1555   ALT 24 08/19/2014 1005   ALT 13 02/10/2014 1555   BILITOT 0.20 08/19/2014 1005   BILITOT 0.3 02/10/2014 1555       Lab Results  Component Value Date   WBC 5.6 08/19/2014   HGB 11.3* 08/19/2014   HCT 34.7* 08/19/2014   MCV 81.8  08/19/2014   PLT 178 08/19/2014   NEUTROABS 4.5 08/19/2014   ASSESSMENT & PLAN:  Breast cancer of upper-outer quadrant of right female breast Right breast mastectomy 06/17/2014: IDC, 3.6 cm, grade 3, 0/4 lymph nodes, T2 N0 M0 stage II a, ER 0%, PR 0%, HER-2 negative, Ki-67 90%  Treatment Plan: 1. Adjuvant chemo with dose dense Adriamycin and Cytoxan followed by Abraxane weekly x 12 2. No role of radiation or antiestrogen therapy afterwards because she is ER/PR negative and she had mastectomy previously.  Current treatment: Today is Cycle 3 day 1 of dose dense AC  Chemo toxicities 1. Headache related to Zofran : instructed her to stop Zofran 2. Loose stools after eating food : encouraged her to take Imodium and to eat more roughage in diet. Also to add probiotics 3. Body aches and pains: she was taking Claritin-D and instructed to add Tylenol to it. This is most likely related to Neulasta 4. Fatigue related to chemotherapy : Profound fatigue to the point that she felt paralyzed. Will reduce the  dosage of chemotherapy with cycle 3. 5. Mild nausea without vomiting 6. Grade 2 neutropenia: Expected for Nadir counts checked we'll keep the doses the same  RTC in 2 weeks for cycle 4 to see our NP  No orders of the defined types were placed in this encounter.   The patient has a good understanding of the overall plan. she agrees with it. she will call with any problems that may develop before the next visit here.   Rulon Eisenmenger, MD

## 2014-08-19 NOTE — Telephone Encounter (Signed)
Appointments made and avs printed for patient °

## 2014-08-19 NOTE — Patient Instructions (Signed)
[facility] Discharge Instructions for Patients Receiving Chemotherapy  Today you received the following chemotherapy agents adriamycin and cytoxan.  To help prevent nausea and vomiting after your treatment, we encourage you to take your nausea medication.   If you develop nausea and vomiting that is not controlled by your nausea medication, call the clinic.   BELOW ARE SYMPTOMS THAT SHOULD BE REPORTED IMMEDIATELY:  *FEVER GREATER THAN 100.5 F  *CHILLS WITH OR WITHOUT FEVER  NAUSEA AND VOMITING THAT IS NOT CONTROLLED WITH YOUR NAUSEA MEDICATION  *UNUSUAL SHORTNESS OF BREATH  *UNUSUAL BRUISING OR BLEEDING  TENDERNESS IN MOUTH AND THROAT WITH OR WITHOUT PRESENCE OF ULCERS  *URINARY PROBLEMS  *BOWEL PROBLEMS  UNUSUAL RASH Items with * indicate a potential emergency and should be followed up as soon as possible.  Feel free to call the clinic you have any questions or concerns. The clinic phone number is [phone].  Please show the [facility] at check-in to the Emergency Department and triage nurse.

## 2014-08-19 NOTE — Assessment & Plan Note (Signed)
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 Adjuvant chemo with dose dense Adriamycin and Cytoxan followed by Abraxane weekly x 12 2. No role of radiation or antiestrogen therapy afterwards because she is ER/PR negative and she had mastectomy previously.  Current treatment: Today is Cycle 3 day 1 of dose dense AC  Chemo toxicities 1. Headache related to Zofran : instructed her to stop Zofran 2. Loose stools after eating food : encouraged her to take Imodium and to eat more roughage in diet. Also to add probiotics 3. Body aches and pains: she was taking Claritin-D and instructed to add Tylenol to it. This is most likely related to Neulasta 4. Fatigue related to chemotherapy : She is already starting to feel better for the past couple of days. 5. Mild nausea without vomiting 6. Grade 2 neutropenia: Expected for Nadir counts checked we'll keep the doses the same  RTC in 2 weeks for cycle 4

## 2014-08-20 ENCOUNTER — Encounter: Payer: Self-pay | Admitting: *Deleted

## 2014-08-21 ENCOUNTER — Encounter: Payer: Self-pay | Admitting: Hematology and Oncology

## 2014-08-21 NOTE — Progress Notes (Signed)
I faxed fmla [phone]

## 2014-08-21 NOTE — Progress Notes (Signed)
I mailed patient copy for her records.

## 2014-08-28 ENCOUNTER — Telehealth: Payer: Self-pay

## 2014-08-28 NOTE — Telephone Encounter (Signed)
Order for bras faxed to [facility].  Sent to scan.  

## 2014-09-02 ENCOUNTER — Encounter: Payer: Self-pay | Admitting: Physician Assistant

## 2014-09-02 ENCOUNTER — Ambulatory Visit (HOSPITAL_BASED_OUTPATIENT_CLINIC_OR_DEPARTMENT_OTHER): Payer: BC Managed Care – PPO | Admitting: Physician Assistant

## 2014-09-02 ENCOUNTER — Ambulatory Visit (HOSPITAL_BASED_OUTPATIENT_CLINIC_OR_DEPARTMENT_OTHER): Payer: BC Managed Care – PPO

## 2014-09-02 ENCOUNTER — Telehealth: Payer: Self-pay | Admitting: Physician Assistant

## 2014-09-02 ENCOUNTER — Other Ambulatory Visit (HOSPITAL_BASED_OUTPATIENT_CLINIC_OR_DEPARTMENT_OTHER): Payer: BC Managed Care – PPO

## 2014-09-02 VITALS — BP 140/61 | HR 94 | Temp 98.4°F | Resp 18 | Ht 63.0 in | Wt 191.4 lb

## 2014-09-02 DIAGNOSIS — M791 Myalgia: Secondary | ICD-10-CM | POA: Diagnosis not present

## 2014-09-02 DIAGNOSIS — C50411 Malignant neoplasm of upper-outer quadrant of right female breast: Secondary | ICD-10-CM

## 2014-09-02 DIAGNOSIS — R530 Neoplastic (malignant) related fatigue: Secondary | ICD-10-CM | POA: Diagnosis not present

## 2014-09-02 DIAGNOSIS — Z5111 Encounter for antineoplastic chemotherapy: Secondary | ICD-10-CM

## 2014-09-02 DIAGNOSIS — R110 Nausea: Secondary | ICD-10-CM

## 2014-09-02 LAB — COMPREHENSIVE METABOLIC PANEL (CC13)
ALT: 15 U/L (ref 0–55)
ANION GAP: 11 meq/L (ref 3–11)
AST: 14 U/L (ref 5–34)
Albumin: 3.5 g/dL (ref 3.5–5.0)
Alkaline Phosphatase: 84 U/L (ref 40–150)
BUN: 9.6 mg/dL (ref 7.0–26.0)
CO2: 24 mEq/L (ref 22–29)
Calcium: 9.1 mg/dL (ref 8.4–10.4)
Chloride: 105 mEq/L (ref 98–109)
Creatinine: 0.7 mg/dL (ref 0.6–1.1)
EGFR: >90 mL/min/{1.73_m2} (ref >90)
GLUCOSE: 176 mg/dL — AB (ref 70–140)
Potassium: 3.4 mEq/L — ABNORMAL LOW (ref 3.5–5.1)
Sodium: 140 mEq/L (ref 136–145)
TOTAL PROTEIN: 6.8 g/dL (ref 6.4–8.3)

## 2014-09-02 LAB — CBC WITH DIFFERENTIAL/PLATELET
BASO%: 0.5 % (ref 0.0–2.0)
Basophils Absolute: 0 10*3/uL (ref 0.0–0.1)
EOS%: 0 % (ref 0.0–7.0)
Eosinophils Absolute: 0 10*3/uL (ref 0.0–0.5)
HCT: 32 % — ABNORMAL LOW (ref 34.8–46.6)
HGB: 10.5 g/dL — ABNORMAL LOW (ref 11.6–15.9)
LYMPH#: 0.4 10*3/uL — AB (ref 0.9–3.3)
LYMPH%: 7.1 % — AB (ref 14.0–49.7)
MCH: 27.3 pg (ref 25.1–34.0)
MCHC: 32.9 g/dL (ref 31.5–36.0)
MCV: 82.9 fL (ref 79.5–101.0)
MONO#: 0.6 10*3/uL (ref 0.1–0.9)
MONO%: 11 % (ref 0.0–14.0)
NEUT%: 81.4 % — ABNORMAL HIGH (ref 38.4–76.8)
NEUTROS ABS: 4.6 10*3/uL (ref 1.5–6.5)
PLATELETS: 202 10*3/uL (ref 145–400)
RBC: 3.86 10*6/uL (ref 3.70–5.45)
RDW: 17.5 % — ABNORMAL HIGH (ref 11.2–14.5)
WBC: 5.7 10*3/uL (ref 3.9–10.3)

## 2014-09-02 MED ORDER — HEPARIN SOD (PORK) LOCK FLUSH 100 UNIT/ML IV SOLN
500.0000 [IU] | Freq: Once | INTRAVENOUS | Status: AC | PRN
  Administered 2014-09-02: 500 [IU]
  Filled 2014-09-02: qty 5

## 2014-09-02 MED ORDER — SODIUM CHLORIDE 0.9 % IV SOLN
Freq: Once | INTRAVENOUS | Status: AC
  Administered 2014-09-02: 12:00:00 via INTRAVENOUS

## 2014-09-02 MED ORDER — SODIUM CHLORIDE 0.9 % IV SOLN
500.0000 mg/m2 | Freq: Once | INTRAVENOUS | Status: AC
  Administered 2014-09-02: 960 mg via INTRAVENOUS
  Filled 2014-09-02: qty 48

## 2014-09-02 MED ORDER — OXYCODONE-ACETAMINOPHEN 5-325 MG PO TABS: 1.0000 | ORAL_TABLET | ORAL | Status: DC | PRN

## 2014-09-02 MED ORDER — PEGFILGRASTIM 6 MG/0.6ML ~~LOC~~ PSKT
6.0000 mg | PREFILLED_SYRINGE | Freq: Once | SUBCUTANEOUS | Status: AC
  Administered 2014-09-02: 6 mg via SUBCUTANEOUS
  Filled 2014-09-02: qty 0.6

## 2014-09-02 MED ORDER — PALONOSETRON HCL INJECTION 0.25 MG/5ML
INTRAVENOUS | Status: AC
Start: 2014-09-02 — End: 2014-09-02
  Filled 2014-09-02: qty 5

## 2014-09-02 MED ORDER — PALONOSETRON HCL INJECTION 0.25 MG/5ML
0.2500 mg | Freq: Once | INTRAVENOUS | Status: AC
  Administered 2014-09-02: 0.25 mg via INTRAVENOUS

## 2014-09-02 MED ORDER — DOXORUBICIN HCL CHEMO IV INJECTION 2 MG/ML
50.0000 mg/m2 | Freq: Once | INTRAVENOUS | Status: AC
  Administered 2014-09-02: 96 mg via INTRAVENOUS
  Filled 2014-09-02: qty 48

## 2014-09-02 MED ORDER — SODIUM CHLORIDE 0.9 % IJ SOLN
10.0000 mL | INTRAMUSCULAR | Status: DC | PRN
  Administered 2014-09-02: 10 mL
  Filled 2014-09-02: qty 10

## 2014-09-02 MED ORDER — SODIUM CHLORIDE 0.9 % IV SOLN
Freq: Once | INTRAVENOUS | Status: AC
  Administered 2014-09-02: 12:00:00 via INTRAVENOUS
  Filled 2014-09-02: qty 5

## 2014-09-02 NOTE — Progress Notes (Signed)
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, Ki-67 90%   05/20/2014 Breast MRI Right breast mass: 2.7 x 2.7 x 3.3 cm along with 3 cm non-mass enhancement, upper inner quadrant 8 mm mass, lower inner quadrant 6 mm mass, right axillary lymph nodes 1.2 cm, T2 bright liver lesions   06/17/2014 Surgery Right breast mastectomy: IDC, 3.6 cm, grade 3, 0/4 lymph nodes, T2 N0 M0 stage II a, ER 0%, PR 0%, HER-2 negative, Ki-67 90%   07/22/2014 -  Chemotherapy Adjuvant chemotherapy with dose dense Adriamycin and Cytoxan 4 followed by Abraxane weekly 12    CHIEF COMPLIANT: Cycle 3 dose dense Adriamycin Cytoxan  INTERVAL HISTORY: Misty Snyder is a 54 year old with above-mentioned history of right-sided breast cancer underwent mastectomy has not had 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 She is here today for cycle 4 of dose dense Adriamycin and Cytoxan chemotherapy. She reports having no energy and pain in her legs with increased fatigue and malaise after chemotherapy, lasting approximately 1 week. She request a refill for her Percocet tablets for pain management. She also is due for a refill for her Vistaril which is prescribed by her psychiatrist. She has an upcoming appointment and we will take care of the Vistaril refill during that appointment. She voiced no other specific complaints today. She tolerated the reduction in her dose dense Adriamycin and Cytoxan relatively well.   REVIEW OF SYSTEMS:   Constitutional: Denies fevers, chills or abnormal weight loss, severe fatigue, loss of taste. She does complain of  having no energy and some fatigue and malaise. Eyes: Denies blurriness of vision Ears, nose, mouth, throat, and face: Denies mucositis or sore throat Respiratory: Denies cough, dyspnea or wheezes Cardiovascular: Denies palpitation, chest discomfort or lower extremity swelling Gastrointestinal:  Denies nausea, heartburn or change in bowel habits Skin: Denies abnormal skin rashes Lymphatics: Denies new lymphadenopathy or easy bruising Neurological:Denies numbness, tingling or new weaknesses Behavioral/Psych: Mood is stable, no new changes  Breast:  denies any pain or lumps or nodules in either breasts All other systems were reviewed with the patient and are negative.  I have reviewed the past medical history, past surgical history, social history and family history with the patient and they are unchanged from previous note.  ALLERGIES:  has No Known Allergies.  MEDICATIONS:  Current Outpatient Prescriptions  Medication Sig Dispense Refill  . amphetamine-dextroamphetamine (ADDERALL) 30 MG tablet Take 1 tablet by mouth daily. 30 tablet 0  . citalopram (CELEXA) 40 MG tablet TAKE 1 TABLET (40 MG TOTAL) BY MOUTH DAILY. 90 tablet 3  . dexamethasone (DECADRON) 4 MG tablet Take 1 tablets by mouth once a day on the day after chemotherapy and then take 1 tablets two times a day for 2 days. Take with food. 30 tablet 1  . HYDROcodone-acetaminophen (VICODIN ES) 7.5-750 MG per tablet Half tab at bedtime as needed 30 tablet 2  . hydrOXYzine (ATARAX/VISTARIL) 50 MG tablet Take 50 mg by mouth 3 (three) times daily as needed (for sleep).    Marland Kitchen lidocaine-prilocaine (EMLA) cream Apply to affected area once 30  g 3  . LORazepam (ATIVAN) 0.5 MG tablet Take 1 tablet (0.5 mg total) by mouth at bedtime. 30 tablet 0  . ondansetron (ZOFRAN) 8 MG tablet Take 1 tablet (8 mg total) by mouth 2 (two) times daily as needed. Start on the third day after chemotherapy. 30 tablet 1  . oxyCODONE-acetaminophen (PERCOCET/ROXICET)  5-325 MG per tablet Take 1 tablet by mouth every 4 (four) hours as needed for moderate pain. 30 tablet 0  . prochlorperazine (COMPAZINE) 10 MG tablet Take 1 tablet (10 mg total) by mouth every 6 (six) hours as needed (Nausea or vomiting). 30 tablet 1   No current facility-administered medications for this visit.   Facility-Administered Medications Ordered in Other Visits  Medication Dose Route Frequency Provider Last Rate Last Dose  . sodium chloride 0.9 % injection 10 mL  10 mL Intracatheter PRN Nicholas Lose, MD   10 mL at 09/02/14 1346    PHYSICAL EXAMINATION: ECOG PERFORMANCE STATUS: 1 - Symptomatic but completely ambulatory  Filed Vitals:   09/02/14 1006  BP: 140/61  Pulse: 94  Temp: 98.4 F (36.9 C)  Resp: 18   Filed Weights   09/02/14 1006  Weight: 191 lb 6.4 oz (86.818 kg)    GENERAL:alert, no distress and comfortable SKIN: skin color, texture, turgor are normal, no rashes or significant lesions EYES: normal, Conjunctiva are pink and non-injected, sclera clear OROPHARYNX:no exudate, no erythema and lips, buccal mucosa, and tongue normal  NECK: supple, thyroid normal size, non-tender, without nodularity LYMPH:  no palpable lymphadenopathy in the cervical, axillary or inguinal LUNGS: clear to auscultation and percussion with normal breathing effort HEART: regular rate & rhythm and no murmurs and no lower extremity edema ABDOMEN:abdomen soft, non-tender and normal bowel sounds Musculoskeletal:no cyanosis of digits and no clubbing  NEURO: alert & oriented x 3 with fluent speech, no focal motor/sensory deficits  LABORATORY DATA:  I have reviewed the data as listed   Chemistry      Component Value Date/Time   NA 140 09/02/2014 0946   NA 136 06/18/2014 0400   K 3.4* 09/02/2014 0946   K 3.6 06/18/2014 0400   CL 103 06/18/2014 0400   CO2 24 09/02/2014 0946   CO2 23 06/18/2014 0400   BUN 9.6 09/02/2014 0946   BUN 6 06/18/2014 0400   CREATININE 0.7 09/02/2014 0946    CREATININE 0.76 06/18/2014 0400      Component Value Date/Time   CALCIUM 9.1 09/02/2014 0946   CALCIUM 8.1* 06/18/2014 0400   ALKPHOS 84 09/02/2014 0946   ALKPHOS 66 02/10/2014 1555   AST 14 09/02/2014 0946   AST 16 02/10/2014 1555   ALT 15 09/02/2014 0946   ALT 13 02/10/2014 1555   BILITOT <0.20 09/02/2014 0946   BILITOT 0.3 02/10/2014 1555       Lab Results  Component Value Date   WBC 5.7 09/02/2014   HGB 10.5* 09/02/2014   HCT 32.0* 09/02/2014   MCV 82.9 09/02/2014   PLT 202 09/02/2014   NEUTROABS 4.6 09/02/2014   ASSESSMENT & PLAN:  Breast cancer of upper-outer quadrant of right female breast Right breast mastectomy 06/17/2014: IDC, 3.6 cm, grade 3, 0/4 lymph nodes, T2 N0 M0 stage II a, ER 0%, PR 0%, HER-2 negative, Ki-67 90%  Treatment Plan: 1. Adjuvant chemo with dose dense Adriamycin and Cytoxan followed by Abraxane weekly x 12 2. No role of radiation or antiestrogen therapy afterwards because she is ER/PR negative and she had mastectomy previously.  Current treatment:  Today is Cycle 3 day 1 of dose dense AC  Chemo toxicities 1. Headache related to Zofran : Resolved after discontinuing Zofran  2. Loose stools after eating food : Improved with her prior instructions to take Imodium, increased roughage in her diet and to take a probiotic.  3. Body aches and pains: Likely related to the Neulasta injection. She has Claritin and pain medication available. She was given a refill prescription for her Percocet tablets today. 4. Fatigue related to chemotherapy : Still has fatigue after chemotherapy but not as severe as prior to the dose reduction of her chemotherapy.  5. Mild nausea without vomiting 6. Grade 2 neutropenia: Resolved   RTC in 2 weeks to see Dr. Lindi Adie and start her weekly Abraxane therapy.  No orders of the defined types were placed in this encounter.   The patient has a good understanding of the overall plan. she agrees with it. she will call with  any problems that may develop before the next visit here.   Carlton Adam, PA-C  09/02/2014

## 2014-09-02 NOTE — Telephone Encounter (Signed)
Gave avs & calendar for August through November.

## 2014-09-08 NOTE — Patient Instructions (Signed)
Continuous labs as scheduled Follow-up in 2 weeks to begin your weekly Abraxane chemotherapy

## 2014-09-09 ENCOUNTER — Ambulatory Visit: Payer: BC Managed Care – PPO

## 2014-09-09 ENCOUNTER — Other Ambulatory Visit: Payer: BC Managed Care – PPO

## 2014-09-16 ENCOUNTER — Ambulatory Visit (HOSPITAL_BASED_OUTPATIENT_CLINIC_OR_DEPARTMENT_OTHER): Payer: BC Managed Care – PPO | Admitting: Hematology and Oncology

## 2014-09-16 ENCOUNTER — Other Ambulatory Visit: Payer: BC Managed Care – PPO

## 2014-09-16 ENCOUNTER — Other Ambulatory Visit: Payer: Self-pay | Admitting: *Deleted

## 2014-09-16 ENCOUNTER — Ambulatory Visit: Payer: BC Managed Care – PPO

## 2014-09-16 ENCOUNTER — Other Ambulatory Visit (HOSPITAL_BASED_OUTPATIENT_CLINIC_OR_DEPARTMENT_OTHER): Payer: BC Managed Care – PPO

## 2014-09-16 ENCOUNTER — Encounter: Payer: Self-pay | Admitting: Hematology and Oncology

## 2014-09-16 ENCOUNTER — Ambulatory Visit (HOSPITAL_BASED_OUTPATIENT_CLINIC_OR_DEPARTMENT_OTHER): Payer: BC Managed Care – PPO

## 2014-09-16 VITALS — BP 115/69 | HR 105 | Temp 98.0°F | Resp 18 | Ht 63.0 in | Wt 186.9 lb

## 2014-09-16 DIAGNOSIS — Z5111 Encounter for antineoplastic chemotherapy: Secondary | ICD-10-CM | POA: Diagnosis not present

## 2014-09-16 DIAGNOSIS — C50411 Malignant neoplasm of upper-outer quadrant of right female breast: Secondary | ICD-10-CM

## 2014-09-16 DIAGNOSIS — F329 Major depressive disorder, single episode, unspecified: Secondary | ICD-10-CM | POA: Diagnosis not present

## 2014-09-16 DIAGNOSIS — F32A Depression, unspecified: Secondary | ICD-10-CM

## 2014-09-16 LAB — CBC WITH DIFFERENTIAL/PLATELET
BASO%: 0.5 % (ref 0.0–2.0)
BASOS ABS: 0 10*3/uL (ref 0.0–0.1)
EOS ABS: 0 10*3/uL (ref 0.0–0.5)
EOS%: 0 % (ref 0.0–7.0)
HEMATOCRIT: 31.3 % — AB (ref 34.8–46.6)
HGB: 10.2 g/dL — ABNORMAL LOW (ref 11.6–15.9)
LYMPH#: 0.2 10*3/uL — AB (ref 0.9–3.3)
LYMPH%: 8.3 % — ABNORMAL LOW (ref 14.0–49.7)
MCH: 27.4 pg (ref 25.1–34.0)
MCHC: 32.4 g/dL (ref 31.5–36.0)
MCV: 84.5 fL (ref 79.5–101.0)
MONO#: 0.5 10*3/uL (ref 0.1–0.9)
MONO%: 18.3 % — ABNORMAL HIGH (ref 0.0–14.0)
NEUT#: 1.9 10*3/uL (ref 1.5–6.5)
NEUT%: 72.9 % (ref 38.4–76.8)
PLATELETS: 258 10*3/uL (ref 145–400)
RBC: 3.71 10*6/uL (ref 3.70–5.45)
RDW: 20.4 % — ABNORMAL HIGH (ref 11.2–14.5)
WBC: 2.6 10*3/uL — ABNORMAL LOW (ref 3.9–10.3)

## 2014-09-16 LAB — COMPREHENSIVE METABOLIC PANEL (CC13)
ALT: 16 U/L (ref 0–55)
ANION GAP: 12 meq/L — AB (ref 3–11)
AST: 13 U/L (ref 5–34)
Albumin: 3.5 g/dL (ref 3.5–5.0)
Alkaline Phosphatase: 68 U/L (ref 40–150)
BUN: 6.8 mg/dL — ABNORMAL LOW (ref 7.0–26.0)
CALCIUM: 8.9 mg/dL (ref 8.4–10.4)
CHLORIDE: 107 meq/L (ref 98–109)
CO2: 22 mEq/L (ref 22–29)
Creatinine: 0.7 mg/dL (ref 0.6–1.1)
Glucose: 222 mg/dl — ABNORMAL HIGH (ref 70–140)
POTASSIUM: 3.2 meq/L — AB (ref 3.5–5.1)
Sodium: 141 mEq/L (ref 136–145)
Total Bilirubin: 0.22 mg/dL (ref 0.20–1.20)
Total Protein: 6.9 g/dL (ref 6.4–8.3)

## 2014-09-16 MED ORDER — SODIUM CHLORIDE 0.9 % IV SOLN
Freq: Once | INTRAVENOUS | Status: AC
  Administered 2014-09-16: 11:00:00 via INTRAVENOUS

## 2014-09-16 MED ORDER — PACLITAXEL PROTEIN-BOUND CHEMO INJECTION 100 MG
80.0000 mg/m2 | Freq: Once | INTRAVENOUS | Status: AC
  Administered 2014-09-16: 150 mg via INTRAVENOUS
  Filled 2014-09-16: qty 30

## 2014-09-16 MED ORDER — PALONOSETRON HCL INJECTION 0.25 MG/5ML
INTRAVENOUS | Status: AC
  Filled 2014-09-16: qty 5

## 2014-09-16 MED ORDER — HEPARIN SOD (PORK) LOCK FLUSH 100 UNIT/ML IV SOLN
500.0000 [IU] | Freq: Once | INTRAVENOUS | Status: AC | PRN
  Administered 2014-09-16: 500 [IU]
  Filled 2014-09-16: qty 5

## 2014-09-16 MED ORDER — PALONOSETRON HCL INJECTION 0.25 MG/5ML
0.2500 mg | Freq: Once | INTRAVENOUS | Status: AC
  Administered 2014-09-16: 0.25 mg via INTRAVENOUS

## 2014-09-16 MED ORDER — SODIUM CHLORIDE 0.9 % IJ SOLN
10.0000 mL | INTRAMUSCULAR | Status: DC | PRN
  Administered 2014-09-16: 10 mL
  Filled 2014-09-16: qty 10

## 2014-09-16 NOTE — Assessment & Plan Note (Signed)
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 Adjuvant chemo with dose dense Adriamycin and Cytoxan followed by Abraxane weekly x 12 2. No role of radiation or antiestrogen therapy afterwards because she is ER/PR negative and she had mastectomy previously.  Current treatment: Completed 4 cycles of Adriamycin and Cytoxan dose dense, today is Cycle 1/12 of Abraxane  Chemo toxicities 1. Headache related to Zofran : instructed her to stop Zofran 2. Loose stools after eating food : encouraged her to take Imodium and to eat more roughage in diet. Also to add probiotics 3. Body aches and pains: she was taking Claritin-D and instructed to add Tylenol to it. This is most likely related to Neulasta 4. Fatigue related to chemotherapy : Profound fatigue to the point that she felt paralyzed. Will reduce the dosage of chemotherapy with cycle 3. 5. Mild nausea without vomiting 6. Grade 2 neutropenia: Expected for Nadir counts checked we'll keep the doses the same  Return to clinic in 1 week for toxicity check on Abraxane

## 2014-09-16 NOTE — Patient Instructions (Addendum)
[facility] Discharge Instructions for Patients Receiving Chemotherapy  Today you received the following chemotherapy agents abraxane.    To help prevent nausea and vomiting after your treatment, we encourage you to take your nausea medication as directed   If you develop nausea and vomiting that is not controlled by your nausea medication, call the clinic.   BELOW ARE SYMPTOMS THAT SHOULD BE REPORTED IMMEDIATELY:  *FEVER GREATER THAN 100.5 F  *CHILLS WITH OR WITHOUT FEVER  NAUSEA AND VOMITING THAT IS NOT CONTROLLED WITH YOUR NAUSEA MEDICATION  *UNUSUAL SHORTNESS OF BREATH  *UNUSUAL BRUISING OR BLEEDING  TENDERNESS IN MOUTH AND THROAT WITH OR WITHOUT PRESENCE OF ULCERS  *URINARY PROBLEMS  *BOWEL PROBLEMS  UNUSUAL RASH Items with * indicate a potential emergency and should be followed up as soon as possible.  Feel free to call the clinic you have any questions or concerns. The clinic phone number is [phone].  Nanoparticle Albumin-Bound Paclitaxel injection What is this medicine? NANOPARTICLE ALBUMIN-BOUND PACLITAXEL (Na no PAHR ti kuhl al BYOO muhn-bound PAK li TAX el) is a chemotherapy drug. It targets fast dividing cells, like cancer cells, and causes these cells to die. This medicine is used to treat advanced breast cancer and advanced lung cancer. This medicine may be used for other purposes; ask your health care provider or pharmacist if you have questions. COMMON BRAND NAME(S): Abraxane What should I tell my health care provider before I take this medicine? They need to know if you have any of these conditions: -kidney disease -liver disease -low blood counts, like low platelets, red blood cells, or white blood cells -recent or ongoing radiation therapy -an unusual or allergic reaction to paclitaxel, albumin, other chemotherapy, other medicines, foods, dyes, or preservatives -pregnant or trying to get pregnant -breast-feeding How should I use  this medicine? This drug is given as an infusion into a vein. It is administered in a hospital or clinic by a specially trained health care professional. Talk to your pediatrician regarding the use of this medicine in children. Special care may be needed. Overdosage: If you think you have taken too much of this medicine contact a poison control center or emergency room at once. NOTE: This medicine is only for you. Do not share this medicine with others. What if I miss a dose? It is important not to miss your dose. Call your doctor or health care professional if you are unable to keep an appointment. What may interact with this medicine? -cyclosporine -diazepam -ketoconazole -medicines to increase blood counts like filgrastim, pegfilgrastim, sargramostim -other chemotherapy drugs like cisplatin, doxorubicin, epirubicin, etoposide, teniposide, vincristine -quinidine -testosterone -vaccines -verapamil Talk to your doctor or health care professional before taking any of these medicines: -acetaminophen -aspirin -ibuprofen -ketoprofen -naproxen This list may not describe all possible interactions. Give your health care provider a list of all the medicines, herbs, non-prescription drugs, or dietary supplements you use. Also tell them if you smoke, drink alcohol, or use illegal drugs. Some items may interact with your medicine. What should I watch for while using this medicine? Your condition will be monitored carefully while you are receiving this medicine. You will need important blood work done while you are taking this medicine. This drug may make you feel generally unwell. This is not uncommon, as chemotherapy can affect healthy cells as well as cancer cells. Report any side effects. Continue your course of treatment even though you feel ill unless your doctor tells you to stop. In some cases,   you may be given additional medicines to help with side effects. Follow all directions for their  use. Call your doctor or health care professional for advice if you get a fever, chills or sore throat, or other symptoms of a cold or flu. Do not treat yourself. This drug decreases your body's ability to fight infections. Try to avoid being around people who are sick. This medicine may increase your risk to bruise or bleed. Call your doctor or health care professional if you notice any unusual bleeding. Be careful brushing and flossing your teeth or using a toothpick because you may get an infection or bleed more easily. If you have any dental work done, tell your dentist you are receiving this medicine. Avoid taking products that contain aspirin, acetaminophen, ibuprofen, naproxen, or ketoprofen unless instructed by your doctor. These medicines may hide a fever. Do not become pregnant while taking this medicine. Women should inform their doctor if they wish to become pregnant or think they might be pregnant. There is a potential for serious side effects to an unborn child. Talk to your health care professional or pharmacist for more information. Do not breast-feed an infant while taking this medicine. Men are advised not to father a child while receiving this medicine. What side effects may I notice from receiving this medicine? Side effects that you should report to your doctor or health care professional as soon as possible: -allergic reactions like skin rash, itching or hives, swelling of the face, lips, or tongue -low blood counts - This drug may decrease the number of white blood cells, red blood cells and platelets. You may be at increased risk for infections and bleeding. -signs of infection - fever or chills, cough, sore throat, pain or difficulty passing urine -signs of decreased platelets or bleeding - bruising, pinpoint red spots on the skin, black, tarry stools, nosebleeds -signs of decreased red blood cells - unusually weak or tired, fainting spells, lightheadedness -breathing  problems -changes in vision -chest pain -high or low blood pressure -mouth sores -nausea and vomiting -pain, swelling, redness or irritation at the injection site -pain, tingling, numbness in the hands or feet -slow or irregular heartbeat -swelling of the ankle, feet, hands Side effects that usually do not require medical attention (report to your doctor or health care professional if they continue or are bothersome): -aches, pains -changes in the color of fingernails -diarrhea -hair loss -loss of appetite This list may not describe all possible side effects. Call your doctor for medical advice about side effects. You may report side effects to FDA at 1-800-FDA-1088. Where should I keep my medicine? This drug is given in a hospital or clinic and will not be stored at home. NOTE: This sheet is a summary. It may not cover all possible information. If you have questions about this medicine, talk to your doctor, pharmacist, or health care provider.  2015, [employer]vier/Gold Standard. (2012-02-26 16:48:50)   

## 2014-09-16 NOTE — Progress Notes (Signed)
Reviewed labs. OK to treat per Dr. Lindi Adie

## 2014-09-16 NOTE — Progress Notes (Signed)
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, Ki-67 90%   05/20/2014 Breast MRI Right breast mass: 2.7 x 2.7 x 3.3 cm along with 3 cm non-mass enhancement, upper inner quadrant 8 mm mass, lower inner quadrant 6 mm mass, right axillary lymph nodes 1.2 cm, T2 bright liver lesions   06/17/2014 Surgery Right breast mastectomy: IDC, 3.6 cm, grade 3, 0/4 lymph nodes, T2 N0 M0 stage II a, ER 0%, PR 0%, HER-2 negative, Ki-67 90%   07/22/2014 -  Chemotherapy Adjuvant chemotherapy with dose dense Adriamycin and Cytoxan 4 followed by Abraxane weekly 12    CHIEF COMPLIANT: Abraxane week 1  INTERVAL HISTORY: Misty Snyder is a 54 year old with above-mentioned history of 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 Today is cycle 1 of Abraxane. She reports that after full cycle of Adriamycin and Cytoxan she may have removed the Neulasta much quicker than she should have. Because of fish today her white count is lower. She reports no nausea or vomiting. She does complain of leg pains especially in her calfs and shins. She has not been drinking any water she does drink lemonade but it is with lots of sugar in it. Her taste is still somewhat poor.  REVIEW OF SYSTEMS:   Constitutional: Denies fevers, chills or abnormal weight loss Eyes: Denies blurriness of vision Ears, nose, mouth, throat, and face: Denies mucositis or sore throat Respiratory: Denies cough, dyspnea or wheezes Cardiovascular: Denies palpitation, chest discomfort or lower extremity swelling Gastrointestinal:  Denies nausea,  heartburn or change in bowel habits Skin: Denies abnormal skin rashes Lymphatics: Denies new lymphadenopathy or easy bruising Neurological:Denies numbness, tingling or new weaknesses Behavioral/Psych: Mood is stable, no new changes   All other systems were reviewed with the patient and are negative.  I have reviewed the past medical history, past surgical history, social history and family history with the patient and they are unchanged from previous note.  ALLERGIES:  has No Known Allergies.  MEDICATIONS:  Current Outpatient Prescriptions  Medication Sig Dispense Refill  . amphetamine-dextroamphetamine (ADDERALL) 30 MG tablet Take 1 tablet by mouth daily. 30 tablet 0  . citalopram (CELEXA) 40 MG tablet TAKE 1 TABLET (40 MG TOTAL) BY MOUTH DAILY. 90 tablet 3  . dexamethasone (DECADRON) 4 MG tablet Take 1 tablets by mouth once a day on the day after chemotherapy and then take 1 tablets two times a day for 2 days. Take with food. 30 tablet 1  . HYDROcodone-acetaminophen (VICODIN ES) 7.5-750 MG per tablet Half tab at bedtime as needed 30 tablet 2  . hydrOXYzine (ATARAX/VISTARIL) 50 MG tablet Take 50 mg by mouth 3 (three) times daily as needed (for sleep).    Marland Kitchen oxyCODONE-acetaminophen (PERCOCET/ROXICET) 5-325 MG per tablet Take 1 tablet by mouth every 4 (four) hours as needed for moderate pain. 30 tablet 0   No current facility-administered medications for this visit.    PHYSICAL EXAMINATION: ECOG PERFORMANCE STATUS: 1 - Symptomatic but completely ambulatory  Filed Vitals:   09/16/14 0913  BP: 115/69  Pulse: 105  Temp: 98 F (  36.7 C)  Resp: 18   Filed Weights   09/16/14 0913  Weight: 186 lb 14.4 oz (84.777 kg)    GENERAL:alert, no distress and comfortable SKIN: skin color, texture, turgor are normal, no rashes or significant lesions EYES: normal, Conjunctiva are pink and non-injected, sclera clear OROPHARYNX:no exudate, no erythema and lips, buccal mucosa, and tongue normal   NECK: supple, thyroid normal size, non-tender, without nodularity LYMPH:  no palpable lymphadenopathy in the cervical, axillary or inguinal LUNGS: clear to auscultation and percussion with normal breathing effort HEART: regular rate & rhythm and no murmurs and no lower extremity edema ABDOMEN:abdomen soft, non-tender and normal bowel sounds Musculoskeletal:no cyanosis of digits and no clubbing  NEURO: alert & oriented x 3 with fluent speech, no focal motor/sensory deficits  LABORATORY DATA:  I have reviewed the data as listed   Chemistry      Component Value Date/Time   NA 141 09/16/2014 0925   NA 136 06/18/2014 0400   K 3.2* 09/16/2014 0925   K 3.6 06/18/2014 0400   CL 103 06/18/2014 0400   CO2 22 09/16/2014 0925   CO2 23 06/18/2014 0400   BUN 6.8* 09/16/2014 0925   BUN 6 06/18/2014 0400   CREATININE 0.7 09/16/2014 0925   CREATININE 0.76 06/18/2014 0400      Component Value Date/Time   CALCIUM 8.9 09/16/2014 0925   CALCIUM 8.1* 06/18/2014 0400   ALKPHOS 68 09/16/2014 0925   ALKPHOS 66 02/10/2014 1555   AST 13 09/16/2014 0925   AST 16 02/10/2014 1555   ALT 16 09/16/2014 0925   ALT 13 02/10/2014 1555   BILITOT 0.22 09/16/2014 0925   BILITOT 0.3 02/10/2014 1555       Lab Results  Component Value Date   WBC 2.6* 09/16/2014   HGB 10.2* 09/16/2014   HCT 31.3* 09/16/2014   MCV 84.5 09/16/2014   PLT 258 09/16/2014   NEUTROABS 1.9 09/16/2014   ASSESSMENT & PLAN:  Breast cancer of upper-outer quadrant of right female breast Right breast mastectomy 06/17/2014: IDC, 3.6 cm, grade 3, 0/4 lymph nodes, T2 N0 M0 stage II a, ER 0%, PR 0%, HER-2 negative, Ki-67 90%  Treatment Plan: 1. Adjuvant chemo with dose dense Adriamycin and Cytoxan followed by Abraxane weekly x 12 2. No role of radiation or antiestrogen therapy afterwards because she is ER/PR negative and she had mastectomy previously.  Current treatment: Completed 4 cycles of Adriamycin and Cytoxan dose dense, today  is Cycle 1/12 of Abraxane  Chemo toxicities 1. Headache related to Zofran : instructed her to stop Zofran 2. Loose stools after eating food : encouraged her to take Imodium and to eat more roughage in diet. Also to add probiotics 3. Body aches and pains: she was taking Claritin-D and instructed to add Tylenol to it. This is most likely related to Neulasta 4. Fatigue related to chemotherapy : Profound fatigue to the point that she felt paralyzed. Will reduce the dosage of chemotherapy with cycle 3. 5. Mild nausea without vomiting 6. Grade 2 neutropenia: Expected for Nadir counts checked we'll keep the doses the same  Return to clinic in 1 week for toxicity check on Abraxane  patient would like to go back to work 2 days a week. I instructed her to discuss this with me next week when she comes back to decide if she can go back to work 2 days. She works in the school as a Social worker.  No orders of the defined types were placed in  this encounter.   The patient has a good understanding of the overall plan. she agrees with it. she will call with any problems that may develop before the next visit here.   Rulon Eisenmenger, MD

## 2014-09-17 ENCOUNTER — Telehealth: Payer: Self-pay | Admitting: *Deleted

## 2014-09-17 NOTE — Telephone Encounter (Signed)
1st Abraxane yesterday. Patient states she has some nausea and is taking her meds for that. Denies vomiting or diarrhea. Doesn't have much of an appetite, encouraged to eat small frequent meals. Advised to call with any questions or concerns. She verbalized understanding.

## 2014-09-22 ENCOUNTER — Other Ambulatory Visit: Payer: Self-pay

## 2014-09-22 DIAGNOSIS — C50411 Malignant neoplasm of upper-outer quadrant of right female breast: Secondary | ICD-10-CM

## 2014-09-23 ENCOUNTER — Encounter: Payer: Self-pay | Admitting: Hematology and Oncology

## 2014-09-23 ENCOUNTER — Telehealth: Payer: Self-pay | Admitting: Hematology and Oncology

## 2014-09-23 ENCOUNTER — Ambulatory Visit (HOSPITAL_BASED_OUTPATIENT_CLINIC_OR_DEPARTMENT_OTHER): Payer: BC Managed Care – PPO | Admitting: Hematology and Oncology

## 2014-09-23 ENCOUNTER — Ambulatory Visit (HOSPITAL_BASED_OUTPATIENT_CLINIC_OR_DEPARTMENT_OTHER): Payer: BC Managed Care – PPO

## 2014-09-23 ENCOUNTER — Other Ambulatory Visit (HOSPITAL_BASED_OUTPATIENT_CLINIC_OR_DEPARTMENT_OTHER): Payer: BC Managed Care – PPO

## 2014-09-23 VITALS — BP 125/74 | HR 94 | Temp 98.2°F | Resp 18 | Ht 63.0 in | Wt 185.1 lb

## 2014-09-23 DIAGNOSIS — Z5111 Encounter for antineoplastic chemotherapy: Secondary | ICD-10-CM

## 2014-09-23 DIAGNOSIS — F329 Major depressive disorder, single episode, unspecified: Secondary | ICD-10-CM

## 2014-09-23 DIAGNOSIS — C50411 Malignant neoplasm of upper-outer quadrant of right female breast: Secondary | ICD-10-CM

## 2014-09-23 DIAGNOSIS — G4701 Insomnia due to medical condition: Secondary | ICD-10-CM

## 2014-09-23 DIAGNOSIS — R530 Neoplastic (malignant) related fatigue: Secondary | ICD-10-CM

## 2014-09-23 DIAGNOSIS — M791 Myalgia: Secondary | ICD-10-CM | POA: Diagnosis not present

## 2014-09-23 DIAGNOSIS — F32A Depression, unspecified: Secondary | ICD-10-CM

## 2014-09-23 LAB — COMPREHENSIVE METABOLIC PANEL (CC13)
ALBUMIN: 3.7 g/dL (ref 3.5–5.0)
ALK PHOS: 69 U/L (ref 40–150)
ALT: 19 U/L (ref 0–55)
AST: 23 U/L (ref 5–34)
Anion Gap: 10 mEq/L (ref 3–11)
BILIRUBIN TOTAL: 0.22 mg/dL (ref 0.20–1.20)
BUN: 7.8 mg/dL (ref 7.0–26.0)
CALCIUM: 9.3 mg/dL (ref 8.4–10.4)
CO2: 25 mEq/L (ref 22–29)
Chloride: 107 mEq/L (ref 98–109)
Creatinine: 0.7 mg/dL (ref 0.6–1.1)
GLUCOSE: 131 mg/dL (ref 70–140)
POTASSIUM: 4 meq/L (ref 3.5–5.1)
SODIUM: 141 meq/L (ref 136–145)
TOTAL PROTEIN: 7.2 g/dL (ref 6.4–8.3)

## 2014-09-23 LAB — CBC WITH DIFFERENTIAL/PLATELET
BASO%: 0 % (ref 0.0–2.0)
Basophils Absolute: 0 10e3/uL (ref 0.0–0.1)
EOS%: 0.2 % (ref 0.0–7.0)
Eosinophils Absolute: 0 10e3/uL (ref 0.0–0.5)
HCT: 32 % — ABNORMAL LOW (ref 34.8–46.6)
HGB: 10.3 g/dL — ABNORMAL LOW (ref 11.6–15.9)
LYMPH%: 7 % — ABNORMAL LOW (ref 14.0–49.7)
MCH: 27.5 pg (ref 25.1–34.0)
MCHC: 32.2 g/dL (ref 31.5–36.0)
MCV: 85.3 fL (ref 79.5–101.0)
MONO#: 0.9 10e3/uL (ref 0.1–0.9)
MONO%: 19.6 % — ABNORMAL HIGH (ref 0.0–14.0)
NEUT#: 3.3 10e3/uL (ref 1.5–6.5)
NEUT%: 73.2 % (ref 38.4–76.8)
Platelets: 337 10e3/uL (ref 145–400)
RBC: 3.75 10e6/uL (ref 3.70–5.45)
RDW: 20 % — ABNORMAL HIGH (ref 11.2–14.5)
WBC: 4.4 10e3/uL (ref 3.9–10.3)
lymph#: 0.3 10e3/uL — ABNORMAL LOW (ref 0.9–3.3)

## 2014-09-23 MED ORDER — PALONOSETRON HCL INJECTION 0.25 MG/5ML
INTRAVENOUS | Status: AC
  Filled 2014-09-23: qty 5

## 2014-09-23 MED ORDER — SODIUM CHLORIDE 0.9 % IJ SOLN
10.0000 mL | INTRAMUSCULAR | Status: DC | PRN
  Administered 2014-09-23: 10 mL
  Filled 2014-09-23: qty 10

## 2014-09-23 MED ORDER — SODIUM CHLORIDE 0.9 % IV SOLN
Freq: Once | INTRAVENOUS | Status: AC
  Administered 2014-09-23: 12:00:00 via INTRAVENOUS

## 2014-09-23 MED ORDER — LORAZEPAM 0.5 MG PO TABS: 0.5000 mg | ORAL_TABLET | Freq: Three times a day (TID) | ORAL | Status: DC | PRN

## 2014-09-23 MED ORDER — PACLITAXEL PROTEIN-BOUND CHEMO INJECTION 100 MG
80.0000 mg/m2 | Freq: Once | INTRAVENOUS | Status: AC
  Administered 2014-09-23: 150 mg via INTRAVENOUS
  Filled 2014-09-23: qty 30

## 2014-09-23 MED ORDER — PALONOSETRON HCL INJECTION 0.25 MG/5ML
0.2500 mg | Freq: Once | INTRAVENOUS | Status: AC
  Administered 2014-09-23: 0.25 mg via INTRAVENOUS

## 2014-09-23 MED ORDER — MELOXICAM 7.5 MG PO TABS: 7.5000 mg | ORAL_TABLET | Freq: Every day | ORAL | Status: DC

## 2014-09-23 MED ORDER — HEPARIN SOD (PORK) LOCK FLUSH 100 UNIT/ML IV SOLN
500.0000 [IU] | Freq: Once | INTRAVENOUS | Status: AC | PRN
  Administered 2014-09-23: 500 [IU]
  Filled 2014-09-23: qty 5

## 2014-09-23 MED ORDER — PROCHLORPERAZINE MALEATE 10 MG PO TABS: 10.0000 mg | ORAL_TABLET | Freq: Four times a day (QID) | ORAL | Status: DC | PRN

## 2014-09-23 NOTE — Progress Notes (Signed)
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, Ki-67 90%   05/20/2014 Breast MRI Right breast mass: 2.7 x 2.7 x 3.3 cm along with 3 cm non-mass enhancement, upper inner quadrant 8 mm mass, lower inner quadrant 6 mm mass, right axillary lymph nodes 1.2 cm, T2 bright liver lesions   06/17/2014 Surgery Right breast mastectomy: IDC, 3.6 cm, grade 3, 0/4 lymph nodes, T2 N0 M0 stage II a, ER 0%, PR 0%, HER-2 negative, Ki-67 90%   07/22/2014 -  Chemotherapy Adjuvant chemotherapy with dose dense Adriamycin and Cytoxan 4 followed by Abraxane weekly 12    CHIEF COMPLIANT: Cycle 2/12 Abraxane  INTERVAL HISTORY: Misty Snyder is a 54 year old lady with above-mentioned history of right breast cancer currently on adjuvant chemotherapy. She is here today to receive cycle 2 of Abraxane. She reports that Abraxane was much better tolerated. She denies any nausea or vomiting. She does complain of aches and pains in her lower extremities. Denies any fevers or chills. She does feel fatigued for 2 or 3 days after chemotherapy.  REVIEW OF SYSTEMS:   Constitutional: Denies fevers, chills or abnormal weight loss, fatigue, alopecia Eyes: Denies blurriness of vision Ears, nose, mouth, throat, and face: Denies mucositis or sore throat Respiratory: Denies cough, dyspnea or wheezes Cardiovascular: Denies palpitation, chest discomfort or lower extremity swelling Gastrointestinal:  Denies nausea, heartburn or change in bowel habits Skin: Denies abnormal skin rashes Lymphatics: Denies new lymphadenopathy or easy bruising Neurological:Denies  numbness, tingling or new weaknesses Behavioral/Psych: Mood is stable, no new changes  All other systems were reviewed with the patient and are negative.  I have reviewed the past medical history, past surgical history, social history and family history with the patient and they are unchanged from previous note.  ALLERGIES:  has No Known Allergies.  MEDICATIONS:  Current Outpatient Prescriptions  Medication Sig Dispense Refill  . amphetamine-dextroamphetamine (ADDERALL) 30 MG tablet Take 1 tablet by mouth daily. 30 tablet 0  . citalopram (CELEXA) 40 MG tablet TAKE 1 TABLET (40 MG TOTAL) BY MOUTH DAILY. 90 tablet 3  . dexamethasone (DECADRON) 4 MG tablet Take 1 tablets by mouth once a day on the day after chemotherapy and then take 1 tablets two times a day for 2 days. Take with food. 30 tablet 1  . hydrOXYzine (ATARAX/VISTARIL) 50 MG tablet Take 50 mg by mouth 3 (three) times daily as needed (for sleep).    . LORazepam (ATIVAN) 0.5 MG tablet Take 1 tablet (0.5 mg total) by mouth every 8 (eight) hours as needed for anxiety (or nausea). 30 tablet 0  . meloxicam (MOBIC) 7.5 MG tablet Take 1 tablet (7.5 mg total) by mouth daily. 30 tablet 0  . oxyCODONE-acetaminophen (PERCOCET/ROXICET) 5-325 MG per tablet Take 1 tablet by mouth every 4 (four) hours as needed for moderate pain. 30 tablet 0  . prochlorperazine (COMPAZINE) 10 MG tablet Take 1 tablet (10 mg total) by mouth every 6 (six) hours as needed. 30 tablet 3   No current facility-administered medications for this visit.   Facility-Administered Medications Ordered in Other Visits  Medication Dose Route Frequency Provider Last Rate Last Dose  . heparin lock flush 100 unit/mL  500 Units Intracatheter Once PRN Nicholas Lose, MD      . PACLitaxel-protein bound (ABRAXANE) chemo infusion 150 mg  80 mg/m2 (Treatment Plan Actual) Intravenous Once Nicholas Lose, MD      . sodium chloride 0.9 % injection 10 mL  10 mL Intracatheter PRN Nicholas Lose, MD         PHYSICAL EXAMINATION: ECOG PERFORMANCE STATUS: 1 - Symptomatic but completely ambulatory  Filed Vitals:   09/23/14 1024  BP: 125/74  Pulse: 94  Temp: 98.2 F (36.8 C)  Resp: 18   Filed Weights   09/23/14 1024  Weight: 185 lb 1.6 oz (83.961 kg)    GENERAL:alert, no distress and comfortable SKIN: skin color, texture, turgor are normal, no rashes or significant lesions EYES: normal, Conjunctiva are pink and non-injected, sclera clear OROPHARYNX:no exudate, no erythema and lips, buccal mucosa, and tongue normal  NECK: supple, thyroid normal size, non-tender, without nodularity LYMPH:  no palpable lymphadenopathy in the cervical, axillary or inguinal LUNGS: clear to auscultation and percussion with normal breathing effort HEART: regular rate & rhythm and no murmurs and no lower extremity edema ABDOMEN:abdomen soft, non-tender and normal bowel sounds Musculoskeletal:no cyanosis of digits and no clubbing  NEURO: alert & oriented x 3 with fluent speech, no focal motor/sensory deficits, lower extremity tenderness and pain  LABORATORY DATA:  I have reviewed the data as listed   Chemistry      Component Value Date/Time   NA 141 09/23/2014 1008   NA 136 06/18/2014 0400   K 4.0 09/23/2014 1008   K 3.6 06/18/2014 0400   CL 103 06/18/2014 0400   CO2 25 09/23/2014 1008   CO2 23 06/18/2014 0400   BUN 7.8 09/23/2014 1008   BUN 6 06/18/2014 0400   CREATININE 0.7 09/23/2014 1008   CREATININE 0.76 06/18/2014 0400      Component Value Date/Time   CALCIUM 9.3 09/23/2014 1008   CALCIUM 8.1* 06/18/2014 0400   ALKPHOS 69 09/23/2014 1008   ALKPHOS 66 02/10/2014 1555   AST 23 09/23/2014 1008   AST 16 02/10/2014 1555   ALT 19 09/23/2014 1008   ALT 13 02/10/2014 1555   BILITOT 0.22 09/23/2014 1008   BILITOT 0.3 02/10/2014 1555       Lab Results  Component Value Date   WBC 4.4 09/23/2014   HGB 10.3* 09/23/2014   HCT 32.0* 09/23/2014   MCV 85.3 09/23/2014   PLT 337 09/23/2014    NEUTROABS 3.3 09/23/2014   ASSESSMENT & PLAN:  Breast cancer of upper-outer quadrant of right female breast Right breast mastectomy 06/17/2014: IDC, 3.6 cm, grade 3, 0/4 lymph nodes, T2 N0 M0 stage II a, ER 0%, PR 0%, HER-2 negative, Ki-67 90%  Treatment Plan: 1. Adjuvant chemo with dose dense Adriamycin and Cytoxan followed by Abraxane weekly x 12 2. No role of radiation or antiestrogen therapy afterwards because she is ER/PR negative and she had mastectomy previously.  Current treatment: Completed 4 cycles of Adriamycin and Cytoxan dose dense, today is Cycle 2/12 of Abraxane  Abraxane Toxicities: 1. Fatigue Monitoring closely for toxicities  Lower extremity myalgias: I prescribed her Mobic to be taken as needed.  Patient would like to go back to work 2 days week on Mondays and Tuesdays. I provided her with a letter stating that she can go back to work for those 2 days. She works in a school in  an office.   RTC in 2 weeks For cycle 4/12 Abraxane  No orders of the defined types were placed in this encounter.   The patient has a good understanding of the overall plan. she agrees with it. she will call with any problems that may develop before the next visit here.   Rulon Eisenmenger, MD

## 2014-09-23 NOTE — Patient Instructions (Signed)
[facility] Discharge Instructions for Patients Receiving Chemotherapy  Today you received the following chemotherapy agents Abraxane To help prevent nausea and vomiting after your treatment, we encourage you to take your nausea medication as prescribed.   If you develop nausea and vomiting that is not controlled by your nausea medication, call the clinic.   BELOW ARE SYMPTOMS THAT SHOULD BE REPORTED IMMEDIATELY:  *FEVER GREATER THAN 100.5 F  *CHILLS WITH OR WITHOUT FEVER  NAUSEA AND VOMITING THAT IS NOT CONTROLLED WITH YOUR NAUSEA MEDICATION  *UNUSUAL SHORTNESS OF BREATH  *UNUSUAL BRUISING OR BLEEDING  TENDERNESS IN MOUTH AND THROAT WITH OR WITHOUT PRESENCE OF ULCERS  *URINARY PROBLEMS  *BOWEL PROBLEMS  UNUSUAL RASH Items with * indicate a potential emergency and should be followed up as soon as possible.  Feel free to call the clinic you have any questions or concerns. The clinic phone number is [phone].  Please show the CHEMO ALERT CARD at check-in to the Emergency Department and triage nurse.   

## 2014-09-23 NOTE — Telephone Encounter (Signed)
Gave avs & calendar for September through November. °

## 2014-09-23 NOTE — Assessment & Plan Note (Signed)
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 Adjuvant chemo with dose dense Adriamycin and Cytoxan followed by Abraxane weekly x 12 2. No role of radiation or antiestrogen therapy afterwards because she is ER/PR negative and she had mastectomy previously.  Current treatment: Completed 4 cycles of Adriamycin and Cytoxan dose dense, today is Cycle 2/12 of Abraxane  Abraxane Toxicities: 1. Fatigue Monitoring closely for toxicities RTC in 2 weeks

## 2014-09-30 ENCOUNTER — Ambulatory Visit (HOSPITAL_BASED_OUTPATIENT_CLINIC_OR_DEPARTMENT_OTHER): Payer: BC Managed Care – PPO

## 2014-09-30 ENCOUNTER — Other Ambulatory Visit (HOSPITAL_BASED_OUTPATIENT_CLINIC_OR_DEPARTMENT_OTHER): Payer: BC Managed Care – PPO

## 2014-09-30 VITALS — BP 119/75 | HR 90 | Temp 97.0°F | Resp 18

## 2014-09-30 DIAGNOSIS — Z5111 Encounter for antineoplastic chemotherapy: Secondary | ICD-10-CM

## 2014-09-30 DIAGNOSIS — C50411 Malignant neoplasm of upper-outer quadrant of right female breast: Secondary | ICD-10-CM | POA: Diagnosis not present

## 2014-09-30 LAB — COMPREHENSIVE METABOLIC PANEL (CC13)
ALBUMIN: 3.9 g/dL (ref 3.5–5.0)
ALK PHOS: 65 U/L (ref 40–150)
ALT: 28 U/L (ref 0–55)
AST: 24 U/L (ref 5–34)
Anion Gap: 9 mEq/L (ref 3–11)
BILIRUBIN TOTAL: 0.22 mg/dL (ref 0.20–1.20)
BUN: 12.9 mg/dL (ref 7.0–26.0)
CALCIUM: 9.5 mg/dL (ref 8.4–10.4)
CO2: 27 mEq/L (ref 22–29)
CREATININE: 0.7 mg/dL (ref 0.6–1.1)
Chloride: 106 mEq/L (ref 98–109)
EGFR: >90 mL/min/{1.73_m2} (ref >90)
Glucose: 116 mg/dl (ref 70–140)
Potassium: 3.8 mEq/L (ref 3.5–5.1)
Sodium: 143 mEq/L (ref 136–145)
Total Protein: 7 g/dL (ref 6.4–8.3)

## 2014-09-30 LAB — CBC WITH DIFFERENTIAL/PLATELET
BASO%: 0.5 % (ref 0.0–2.0)
BASOS ABS: 0 10*3/uL (ref 0.0–0.1)
EOS%: 0.3 % (ref 0.0–7.0)
Eosinophils Absolute: 0 10*3/uL (ref 0.0–0.5)
HEMATOCRIT: 31.1 % — AB (ref 34.8–46.6)
HEMOGLOBIN: 10.2 g/dL — AB (ref 11.6–15.9)
LYMPH#: 0.4 10*3/uL — AB (ref 0.9–3.3)
LYMPH%: 9.8 % — ABNORMAL LOW (ref 14.0–49.7)
MCH: 27.9 pg (ref 25.1–34.0)
MCHC: 32.8 g/dL (ref 31.5–36.0)
MCV: 84.9 fL (ref 79.5–101.0)
MONO#: 0.6 10*3/uL (ref 0.1–0.9)
MONO%: 14.3 % — ABNORMAL HIGH (ref 0.0–14.0)
NEUT#: 3.4 10*3/uL (ref 1.5–6.5)
NEUT%: 75.1 % (ref 38.4–76.8)
Platelets: 347 10*3/uL (ref 145–400)
RBC: 3.66 10*6/uL — ABNORMAL LOW (ref 3.70–5.45)
RDW: 20.7 % — AB (ref 11.2–14.5)
WBC: 4.5 10*3/uL (ref 3.9–10.3)

## 2014-09-30 MED ORDER — PALONOSETRON HCL INJECTION 0.25 MG/5ML
0.2500 mg | Freq: Once | INTRAVENOUS | Status: AC
  Administered 2014-09-30: 0.25 mg via INTRAVENOUS

## 2014-09-30 MED ORDER — HEPARIN SOD (PORK) LOCK FLUSH 100 UNIT/ML IV SOLN
500.0000 [IU] | Freq: Once | INTRAVENOUS | Status: AC | PRN
  Administered 2014-09-30: 500 [IU]
  Filled 2014-09-30: qty 5

## 2014-09-30 MED ORDER — SODIUM CHLORIDE 0.9 % IV SOLN
Freq: Once | INTRAVENOUS | Status: AC
  Administered 2014-09-30: 11:00:00 via INTRAVENOUS

## 2014-09-30 MED ORDER — SODIUM CHLORIDE 0.9 % IJ SOLN
10.0000 mL | INTRAMUSCULAR | Status: DC | PRN
  Administered 2014-09-30: 10 mL
  Filled 2014-09-30: qty 10

## 2014-09-30 MED ORDER — PACLITAXEL PROTEIN-BOUND CHEMO INJECTION 100 MG
80.0000 mg/m2 | Freq: Once | INTRAVENOUS | Status: AC
  Administered 2014-09-30: 150 mg via INTRAVENOUS
  Filled 2014-09-30: qty 30

## 2014-09-30 MED ORDER — PALONOSETRON HCL INJECTION 0.25 MG/5ML
INTRAVENOUS | Status: AC
  Filled 2014-09-30: qty 5

## 2014-09-30 NOTE — Patient Instructions (Signed)
[facility] Discharge Instructions for Patients Receiving Chemotherapy  Today you received the following chemotherapy agents Abraxane To help prevent nausea and vomiting after your treatment, we encourage you to take your nausea medication as prescribed.   If you develop nausea and vomiting that is not controlled by your nausea medication, call the clinic.   BELOW ARE SYMPTOMS THAT SHOULD BE REPORTED IMMEDIATELY:  *FEVER GREATER THAN 100.5 F  *CHILLS WITH OR WITHOUT FEVER  NAUSEA AND VOMITING THAT IS NOT CONTROLLED WITH YOUR NAUSEA MEDICATION  *UNUSUAL SHORTNESS OF BREATH  *UNUSUAL BRUISING OR BLEEDING  TENDERNESS IN MOUTH AND THROAT WITH OR WITHOUT PRESENCE OF ULCERS  *URINARY PROBLEMS  *BOWEL PROBLEMS  UNUSUAL RASH Items with * indicate a potential emergency and should be followed up as soon as possible.  Feel free to call the clinic you have any questions or concerns. The clinic phone number is [phone].  Please show the CHEMO ALERT CARD at check-in to the Emergency Department and triage nurse.   

## 2014-10-07 ENCOUNTER — Ambulatory Visit (HOSPITAL_BASED_OUTPATIENT_CLINIC_OR_DEPARTMENT_OTHER): Payer: BC Managed Care – PPO

## 2014-10-07 ENCOUNTER — Other Ambulatory Visit (HOSPITAL_BASED_OUTPATIENT_CLINIC_OR_DEPARTMENT_OTHER): Payer: BC Managed Care – PPO

## 2014-10-07 ENCOUNTER — Ambulatory Visit (HOSPITAL_BASED_OUTPATIENT_CLINIC_OR_DEPARTMENT_OTHER): Payer: BC Managed Care – PPO | Admitting: Hematology and Oncology

## 2014-10-07 ENCOUNTER — Telehealth: Payer: Self-pay | Admitting: Hematology and Oncology

## 2014-10-07 ENCOUNTER — Encounter: Payer: Self-pay | Admitting: Hematology and Oncology

## 2014-10-07 VITALS — BP 110/58 | HR 92 | Temp 98.4°F | Resp 18 | Ht 63.0 in | Wt 186.4 lb

## 2014-10-07 DIAGNOSIS — Z5111 Encounter for antineoplastic chemotherapy: Secondary | ICD-10-CM

## 2014-10-07 DIAGNOSIS — F32A Depression, unspecified: Secondary | ICD-10-CM

## 2014-10-07 DIAGNOSIS — F329 Major depressive disorder, single episode, unspecified: Secondary | ICD-10-CM | POA: Diagnosis not present

## 2014-10-07 DIAGNOSIS — C50411 Malignant neoplasm of upper-outer quadrant of right female breast: Secondary | ICD-10-CM

## 2014-10-07 DIAGNOSIS — G4701 Insomnia due to medical condition: Secondary | ICD-10-CM | POA: Diagnosis not present

## 2014-10-07 LAB — CBC WITH DIFFERENTIAL/PLATELET
BASO%: 0.4 % (ref 0.0–2.0)
Basophils Absolute: 0 10*3/uL (ref 0.0–0.1)
EOS%: 0.8 % (ref 0.0–7.0)
Eosinophils Absolute: 0 10*3/uL (ref 0.0–0.5)
HCT: 32.7 % — ABNORMAL LOW (ref 34.8–46.6)
HGB: 10.7 g/dL — ABNORMAL LOW (ref 11.6–15.9)
LYMPH%: 9.9 % — AB (ref 14.0–49.7)
MCH: 28.1 pg (ref 25.1–34.0)
MCHC: 32.8 g/dL (ref 31.5–36.0)
MCV: 85.7 fL (ref 79.5–101.0)
MONO#: 0.4 10*3/uL (ref 0.1–0.9)
MONO%: 8.3 % (ref 0.0–14.0)
NEUT%: 80.6 % — AB (ref 38.4–76.8)
NEUTROS ABS: 3.7 10*3/uL (ref 1.5–6.5)
Platelets: 318 10*3/uL (ref 145–400)
RBC: 3.82 10*6/uL (ref 3.70–5.45)
RDW: 21.8 % — ABNORMAL HIGH (ref 11.2–14.5)
WBC: 4.6 10*3/uL (ref 3.9–10.3)
lymph#: 0.5 10*3/uL — ABNORMAL LOW (ref 0.9–3.3)

## 2014-10-07 LAB — COMPREHENSIVE METABOLIC PANEL (CC13)
ALT: 27 U/L (ref 0–55)
ANION GAP: 10 meq/L (ref 3–11)
AST: 23 U/L (ref 5–34)
Albumin: 4 g/dL (ref 3.5–5.0)
Alkaline Phosphatase: 67 U/L (ref 40–150)
BUN: 9.9 mg/dL (ref 7.0–26.0)
CHLORIDE: 108 meq/L (ref 98–109)
CO2: 25 meq/L (ref 22–29)
CREATININE: 0.7 mg/dL (ref 0.6–1.1)
Calcium: 9.1 mg/dL (ref 8.4–10.4)
EGFR: >90 mL/min/{1.73_m2} (ref >90)
GLUCOSE: 150 mg/dL — AB (ref 70–140)
Potassium: 3.5 mEq/L (ref 3.5–5.1)
SODIUM: 143 meq/L (ref 136–145)
TOTAL PROTEIN: 6.9 g/dL (ref 6.4–8.3)

## 2014-10-07 MED ORDER — SODIUM CHLORIDE 0.9 % IV SOLN
Freq: Once | INTRAVENOUS | Status: AC
  Administered 2014-10-07: 11:00:00 via INTRAVENOUS

## 2014-10-07 MED ORDER — LIDOCAINE-PRILOCAINE 2.5-2.5 % EX CREA: 1.0000 "application " | TOPICAL_CREAM | CUTANEOUS | Status: DC | PRN

## 2014-10-07 MED ORDER — SODIUM CHLORIDE 0.9 % IJ SOLN
10.0000 mL | INTRAMUSCULAR | Status: DC | PRN
  Administered 2014-10-07: 10 mL
  Filled 2014-10-07: qty 10

## 2014-10-07 MED ORDER — HEPARIN SOD (PORK) LOCK FLUSH 100 UNIT/ML IV SOLN
500.0000 [IU] | Freq: Once | INTRAVENOUS | Status: AC | PRN
  Administered 2014-10-07: 500 [IU]
  Filled 2014-10-07: qty 5

## 2014-10-07 MED ORDER — PALONOSETRON HCL INJECTION 0.25 MG/5ML
0.2500 mg | Freq: Once | INTRAVENOUS | Status: AC
  Administered 2014-10-07: 0.25 mg via INTRAVENOUS

## 2014-10-07 MED ORDER — PACLITAXEL PROTEIN-BOUND CHEMO INJECTION 100 MG
80.0000 mg/m2 | Freq: Once | INTRAVENOUS | Status: AC
  Administered 2014-10-07: 150 mg via INTRAVENOUS
  Filled 2014-10-07: qty 30

## 2014-10-07 MED ORDER — PALONOSETRON HCL INJECTION 0.25 MG/5ML
INTRAVENOUS | Status: AC
  Filled 2014-10-07: qty 5

## 2014-10-07 NOTE — Telephone Encounter (Signed)
Appointments made and avs printed for pateint °

## 2014-10-07 NOTE — Assessment & Plan Note (Addendum)
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 Adjuvant chemo with dose dense Adriamycin and Cytoxan followed by Abraxane weekly x 12 2. No role of radiation or antiestrogen therapy afterwards because she is ER/PR negative and she had mastectomy previously. Current treatment: Completed 4 cycles of Adriamycin and Cytoxan dose dense, today is Cycle 4/12 of Abraxane Abraxane Toxicities: 1. Fatigue 2. Lower extremity myalgias: She reports that Mobic has not been very helpful and she takes Aleve twice a day.  Monitoring closely for toxicities Depression: Patient is taking Celexa. She is much happier since she is now able to work 2 days a week. She is worried about risk of infection. I instructed her to keep alcohol antiseptic lotion to keep her from catching infections. She works in a school in an office.   RTC in 2 weeks For cycle 6/12 Abraxane

## 2014-10-07 NOTE — Progress Notes (Signed)
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, Ki-67 90%   05/20/2014 Breast MRI Right breast mass: 2.7 x 2.7 x 3.3 cm along with 3 cm non-mass enhancement, upper inner quadrant 8 mm mass, lower inner quadrant 6 mm mass, right axillary lymph nodes 1.2 cm, T2 bright liver lesions   06/17/2014 Surgery Right breast mastectomy: IDC, 3.6 cm, grade 3, 0/4 lymph nodes, T2 N0 M0 stage II a, ER 0%, PR 0%, HER-2 negative, Ki-67 90%   07/22/2014 -  Chemotherapy Adjuvant chemotherapy with dose dense Adriamycin and Cytoxan 4 followed by Abraxane weekly 12    CHIEF COMPLIANT: Abraxane week 4/12  INTERVAL HISTORY: Misty Snyder is a 54 year old with above-mentioned history of right-sided triple negative breast cancer treated with mastectomy and is currently on adjuvant chemotherapy. Today is week 4 of Abraxane. She is tolerating it fairly well. Her only complaint is muscle aches and pains. She is taking Aleve along with mobility. This is helping her symptoms. She was also using tonic water at bedtime. She tells me that Ativan is not very helpful. She is also moderately depressed. She is seeing a psychiatrist and takes Celexa. She felt markedly better after the last chemotherapy. Does not have as much fatigue as before.  REVIEW OF SYSTEMS:   Constitutional: Denies fevers, chills or abnormal weight loss Eyes: Denies blurriness of vision Ears, nose, mouth, throat, and face: Denies mucositis or sore throat Respiratory: Denies cough, dyspnea or wheezes Cardiovascular: Denies palpitation, chest discomfort or lower extremity  swelling Gastrointestinal:  Denies nausea, heartburn or change in bowel habits Skin: Denies abnormal skin rashes Lymphatics: Denies new lymphadenopathy or easy bruising Neurological:Denies numbness, tingling or new weaknesses Behavioral/Psych: Depression All other systems were reviewed with the patient and are negative.  I have reviewed the past medical history, past surgical history, social history and family history with the patient and they are unchanged from previous note.  ALLERGIES:  has No Known Allergies.  MEDICATIONS:  Current Outpatient Prescriptions  Medication Sig Dispense Refill  . amphetamine-dextroamphetamine (ADDERALL) 30 MG tablet Take 1 tablet by mouth daily. 30 tablet 0  . citalopram (CELEXA) 40 MG tablet TAKE 1 TABLET (40 MG TOTAL) BY MOUTH DAILY. 90 tablet 3  . dexamethasone (DECADRON) 4 MG tablet Take 1 tablets by mouth once a day on the day after chemotherapy and then take 1 tablets two times a day for 2 days. Take with food. 30 tablet 1  . hydrOXYzine (ATARAX/VISTARIL) 50 MG tablet Take 50 mg by mouth 3 (three) times daily as needed (for sleep).    . LORazepam (ATIVAN) 0.5 MG tablet Take 1 tablet (0.5 mg total) by mouth every 8 (eight) hours as needed for anxiety (or nausea). 30 tablet 0  . meloxicam (MOBIC) 7.5 MG tablet Take 1 tablet (7.5 mg total) by mouth daily. 30 tablet 0  . oxyCODONE-acetaminophen (PERCOCET/ROXICET) 5-325 MG per tablet Take 1 tablet by mouth every 4 (four) hours as needed for moderate pain. 30 tablet 0  . prochlorperazine (COMPAZINE) 10 MG tablet Take 1 tablet (10 mg total) by mouth every 6 (  six) hours as needed. 30 tablet 3  . lidocaine-prilocaine (EMLA) cream Apply 1 application topically as needed. 30 g 1   No current facility-administered medications for this visit.    PHYSICAL EXAMINATION: ECOG PERFORMANCE STATUS: 1 - Symptomatic but completely ambulatory  Filed Vitals:   10/07/14 1026  BP: 110/58  Pulse: 92  Temp: 98.4 F (36.9  C)  Resp: 18   Filed Weights   10/07/14 1026  Weight: 186 lb 6.4 oz (84.55 kg)    GENERAL:alert, no distress and comfortable SKIN: skin color, texture, turgor are normal, no rashes or significant lesions EYES: normal, Conjunctiva are pink and non-injected, sclera clear OROPHARYNX:no exudate, no erythema and lips, buccal mucosa, and tongue normal  NECK: supple, thyroid normal size, non-tender, without nodularity LYMPH:  no palpable lymphadenopathy in the cervical, axillary or inguinal LUNGS: clear to auscultation and percussion with normal breathing effort HEART: regular rate & rhythm and no murmurs and no lower extremity edema ABDOMEN:abdomen soft, non-tender and normal bowel sounds Musculoskeletal:no cyanosis of digits and no clubbing  NEURO: alert & oriented x 3 with fluent speech, no focal motor/sensory deficits  LABORATORY DATA:  I have reviewed the data as listed   Chemistry      Component Value Date/Time   NA 143 09/30/2014 1011   NA 136 06/18/2014 0400   K 3.8 09/30/2014 1011   K 3.6 06/18/2014 0400   CL 103 06/18/2014 0400   CO2 27 09/30/2014 1011   CO2 23 06/18/2014 0400   BUN 12.9 09/30/2014 1011   BUN 6 06/18/2014 0400   CREATININE 0.7 09/30/2014 1011   CREATININE 0.76 06/18/2014 0400      Component Value Date/Time   CALCIUM 9.5 09/30/2014 1011   CALCIUM 8.1* 06/18/2014 0400   ALKPHOS 65 09/30/2014 1011   ALKPHOS 66 02/10/2014 1555   AST 24 09/30/2014 1011   AST 16 02/10/2014 1555   ALT 28 09/30/2014 1011   ALT 13 02/10/2014 1555   BILITOT 0.22 09/30/2014 1011   BILITOT 0.3 02/10/2014 1555       Lab Results  Component Value Date   WBC 4.6 10/07/2014   HGB 10.7* 10/07/2014   HCT 32.7* 10/07/2014   MCV 85.7 10/07/2014   PLT 318 10/07/2014   NEUTROABS 3.7 10/07/2014   ASSESSMENT & PLAN:  Breast cancer of upper-outer quadrant of right female breast Right breast mastectomy 06/17/2014: IDC, 3.6 cm, grade 3, 0/4 lymph nodes, T2 N0 M0 stage II a, ER  0%, PR 0%, HER-2 negative, Ki-67 90%  Treatment Plan: 1. Adjuvant chemo with dose dense Adriamycin and Cytoxan followed by Abraxane weekly x 12 2. No role of radiation or antiestrogen therapy afterwards because she is ER/PR negative and she had mastectomy previously. Current treatment: Completed 4 cycles of Adriamycin and Cytoxan dose dense, today is Cycle 4/12 of Abraxane Abraxane Toxicities: 1. Fatigue 2. Lower extremity myalgias: She reports that Mobic has not been very helpful and she takes Aleve twice a day.  Monitoring closely for toxicities Depression: Patient is taking Celexa. She is much happier since she is now able to work 2 days a week. She is worried about risk of infection. I instructed her to keep alcohol antiseptic lotion to keep her from catching infections. 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   The patient has a good understanding of the overall  plan. she agrees with it. she will call with any problems that may develop before the next visit here.   Misty Snyder K, MD      

## 2014-10-07 NOTE — Addendum Note (Signed)
Addended by: Prentiss Bells on: 10/07/2014 12:13 PM   Modules accepted: Orders

## 2014-10-07 NOTE — Patient Instructions (Signed)
[facility] Discharge Instructions for Patients Receiving Chemotherapy  Today you received the following chemotherapy agents: Abraxane.  To help prevent nausea and vomiting after your treatment, we encourage you to take your nausea medication:  Compazine 10 mg every 6 hours as needed.   If you develop nausea and vomiting that is not controlled by your nausea medication, call the clinic.   BELOW ARE SYMPTOMS THAT SHOULD BE REPORTED IMMEDIATELY:  *FEVER GREATER THAN 100.5 F  *CHILLS WITH OR WITHOUT FEVER  NAUSEA AND VOMITING THAT IS NOT CONTROLLED WITH YOUR NAUSEA MEDICATION  *UNUSUAL SHORTNESS OF BREATH  *UNUSUAL BRUISING OR BLEEDING  TENDERNESS IN MOUTH AND THROAT WITH OR WITHOUT PRESENCE OF ULCERS  *URINARY PROBLEMS  *BOWEL PROBLEMS  UNUSUAL RASH Items with * indicate a potential emergency and should be followed up as soon as possible.  Feel free to call the clinic you have any questions or concerns. The clinic phone number is [phone].  Please show the CHEMO ALERT CARD at check-in to the Emergency Department and triage nurse.   

## 2014-10-13 ENCOUNTER — Other Ambulatory Visit: Payer: Self-pay | Admitting: *Deleted

## 2014-10-13 ENCOUNTER — Telehealth: Payer: Self-pay | Admitting: *Deleted

## 2014-10-13 DIAGNOSIS — C50411 Malignant neoplasm of upper-outer quadrant of right female breast: Secondary | ICD-10-CM

## 2014-10-13 MED ORDER — OXYCODONE-ACETAMINOPHEN 5-325 MG PO TABS: 1.0000 | ORAL_TABLET | ORAL | Status: DC | PRN

## 2014-10-13 NOTE — Telephone Encounter (Signed)
Called patient to let her know that Percocet rx was ready for pick up but mail box full.

## 2014-10-14 ENCOUNTER — Other Ambulatory Visit (HOSPITAL_BASED_OUTPATIENT_CLINIC_OR_DEPARTMENT_OTHER): Payer: BC Managed Care – PPO

## 2014-10-14 ENCOUNTER — Ambulatory Visit (HOSPITAL_BASED_OUTPATIENT_CLINIC_OR_DEPARTMENT_OTHER): Payer: BC Managed Care – PPO

## 2014-10-14 VITALS — BP 102/70 | HR 86 | Temp 98.5°F | Resp 18

## 2014-10-14 DIAGNOSIS — C50411 Malignant neoplasm of upper-outer quadrant of right female breast: Secondary | ICD-10-CM

## 2014-10-14 DIAGNOSIS — Z5111 Encounter for antineoplastic chemotherapy: Secondary | ICD-10-CM

## 2014-10-14 LAB — COMPREHENSIVE METABOLIC PANEL (CC13)
ALT: 37 U/L (ref 0–55)
ANION GAP: 9 meq/L (ref 3–11)
AST: 33 U/L (ref 5–34)
Albumin: 3.9 g/dL (ref 3.5–5.0)
Alkaline Phosphatase: 67 U/L (ref 40–150)
BILIRUBIN TOTAL: 0.3 mg/dL (ref 0.20–1.20)
BUN: 12 mg/dL (ref 7.0–26.0)
CALCIUM: 9.3 mg/dL (ref 8.4–10.4)
CHLORIDE: 108 meq/L (ref 98–109)
CO2: 25 mEq/L (ref 22–29)
CREATININE: 0.7 mg/dL (ref 0.6–1.1)
EGFR: >90 mL/min/{1.73_m2} (ref >90)
Glucose: 113 mg/dl (ref 70–140)
Potassium: 3.7 mEq/L (ref 3.5–5.1)
Sodium: 142 mEq/L (ref 136–145)
TOTAL PROTEIN: 7.1 g/dL (ref 6.4–8.3)

## 2014-10-14 LAB — CBC WITH DIFFERENTIAL/PLATELET
BASO%: 0.6 % (ref 0.0–2.0)
Basophils Absolute: 0 10*3/uL (ref 0.0–0.1)
EOS%: 2 % (ref 0.0–7.0)
Eosinophils Absolute: 0.1 10*3/uL (ref 0.0–0.5)
HEMATOCRIT: 33.8 % — AB (ref 34.8–46.6)
HEMOGLOBIN: 10.9 g/dL — AB (ref 11.6–15.9)
LYMPH#: 0.4 10*3/uL — AB (ref 0.9–3.3)
LYMPH%: 12.6 % — ABNORMAL LOW (ref 14.0–49.7)
MCH: 28.1 pg (ref 25.1–34.0)
MCHC: 32.2 g/dL (ref 31.5–36.0)
MCV: 87.4 fL (ref 79.5–101.0)
MONO#: 0.3 10*3/uL (ref 0.1–0.9)
MONO%: 9.6 % (ref 0.0–14.0)
NEUT%: 75.2 % (ref 38.4–76.8)
NEUTROS ABS: 2.4 10*3/uL (ref 1.5–6.5)
PLATELETS: 264 10*3/uL (ref 145–400)
RBC: 3.87 10*6/uL (ref 3.70–5.45)
RDW: 22.1 % — AB (ref 11.2–14.5)
WBC: 3.2 10*3/uL — AB (ref 3.9–10.3)

## 2014-10-14 MED ORDER — SODIUM CHLORIDE 0.9 % IV SOLN
Freq: Once | INTRAVENOUS | Status: AC
  Administered 2014-10-14: 12:00:00 via INTRAVENOUS

## 2014-10-14 MED ORDER — PALONOSETRON HCL INJECTION 0.25 MG/5ML
INTRAVENOUS | Status: AC
  Filled 2014-10-14: qty 5

## 2014-10-14 MED ORDER — SODIUM CHLORIDE 0.9 % IJ SOLN
10.0000 mL | INTRAMUSCULAR | Status: DC | PRN
  Administered 2014-10-14: 10 mL
  Filled 2014-10-14: qty 10

## 2014-10-14 MED ORDER — PALONOSETRON HCL INJECTION 0.25 MG/5ML
0.2500 mg | Freq: Once | INTRAVENOUS | Status: AC
  Administered 2014-10-14: 0.25 mg via INTRAVENOUS

## 2014-10-14 MED ORDER — HEPARIN SOD (PORK) LOCK FLUSH 100 UNIT/ML IV SOLN
500.0000 [IU] | Freq: Once | INTRAVENOUS | Status: AC | PRN
  Administered 2014-10-14: 500 [IU]
  Filled 2014-10-14: qty 5

## 2014-10-14 MED ORDER — PACLITAXEL PROTEIN-BOUND CHEMO INJECTION 100 MG
80.0000 mg/m2 | Freq: Once | INTRAVENOUS | Status: AC
  Administered 2014-10-14: 150 mg via INTRAVENOUS
  Filled 2014-10-14: qty 30

## 2014-10-14 NOTE — Patient Instructions (Signed)
[facility] Discharge Instructions for Patients Receiving Chemotherapy  Today you received the following chemotherapy agents - ABRAXANE  To help prevent nausea and vomiting after your treatment, we encourage you to take your nausea medication AS PRESCRIBED BY YOUR MD.  If you develop nausea and vomiting that is not controlled by your nausea medication, call the clinic.   BELOW ARE SYMPTOMS THAT SHOULD BE REPORTED IMMEDIATELY:  *FEVER GREATER THAN 100.5 F  *CHILLS WITH OR WITHOUT FEVER  NAUSEA AND VOMITING THAT IS NOT CONTROLLED WITH YOUR NAUSEA MEDICATION  *UNUSUAL SHORTNESS OF BREATH  *UNUSUAL BRUISING OR BLEEDING  TENDERNESS IN MOUTH AND THROAT WITH OR WITHOUT PRESENCE OF ULCERS  *URINARY PROBLEMS  *BOWEL PROBLEMS  UNUSUAL RASH Items with * indicate a potential emergency and should be followed up as soon as possible.  Feel free to call the clinic you have any questions or concerns. The clinic phone number is [phone].  Please show the [place] at check-in to the Emergency Department and triage nurse.

## 2014-10-21 ENCOUNTER — Encounter: Payer: Self-pay | Admitting: Hematology and Oncology

## 2014-10-21 ENCOUNTER — Other Ambulatory Visit (HOSPITAL_BASED_OUTPATIENT_CLINIC_OR_DEPARTMENT_OTHER): Payer: BC Managed Care – PPO

## 2014-10-21 ENCOUNTER — Telehealth: Payer: Self-pay | Admitting: Hematology and Oncology

## 2014-10-21 ENCOUNTER — Other Ambulatory Visit: Payer: BC Managed Care – PPO

## 2014-10-21 ENCOUNTER — Ambulatory Visit (HOSPITAL_BASED_OUTPATIENT_CLINIC_OR_DEPARTMENT_OTHER): Payer: BC Managed Care – PPO

## 2014-10-21 ENCOUNTER — Ambulatory Visit (HOSPITAL_BASED_OUTPATIENT_CLINIC_OR_DEPARTMENT_OTHER): Payer: BC Managed Care – PPO | Admitting: Hematology and Oncology

## 2014-10-21 VITALS — BP 110/73 | HR 95 | Temp 98.5°F | Resp 18 | Ht 63.0 in | Wt 187.6 lb

## 2014-10-21 DIAGNOSIS — Z5111 Encounter for antineoplastic chemotherapy: Secondary | ICD-10-CM | POA: Diagnosis not present

## 2014-10-21 DIAGNOSIS — F329 Major depressive disorder, single episode, unspecified: Secondary | ICD-10-CM

## 2014-10-21 DIAGNOSIS — R530 Neoplastic (malignant) related fatigue: Secondary | ICD-10-CM | POA: Diagnosis not present

## 2014-10-21 DIAGNOSIS — M791 Myalgia: Secondary | ICD-10-CM | POA: Diagnosis not present

## 2014-10-21 DIAGNOSIS — C50411 Malignant neoplasm of upper-outer quadrant of right female breast: Secondary | ICD-10-CM

## 2014-10-21 DIAGNOSIS — T451X5A Adverse effect of antineoplastic and immunosuppressive drugs, initial encounter: Secondary | ICD-10-CM

## 2014-10-21 DIAGNOSIS — G620 Drug-induced polyneuropathy: Secondary | ICD-10-CM

## 2014-10-21 LAB — COMPREHENSIVE METABOLIC PANEL (CC13)
ALK PHOS: 74 U/L (ref 40–150)
ALT: 59 U/L — ABNORMAL HIGH (ref 0–55)
ANION GAP: 9 meq/L (ref 3–11)
AST: 42 U/L — ABNORMAL HIGH (ref 5–34)
Albumin: 3.9 g/dL (ref 3.5–5.0)
BUN: 9 mg/dL (ref 7.0–26.0)
CO2: 25 meq/L (ref 22–29)
Calcium: 9.2 mg/dL (ref 8.4–10.4)
Chloride: 109 mEq/L (ref 98–109)
Creatinine: 0.7 mg/dL (ref 0.6–1.1)
GLUCOSE: 129 mg/dL (ref 70–140)
POTASSIUM: 3.6 meq/L (ref 3.5–5.1)
SODIUM: 143 meq/L (ref 136–145)
TOTAL PROTEIN: 7.1 g/dL (ref 6.4–8.3)

## 2014-10-21 LAB — CBC WITH DIFFERENTIAL/PLATELET
BASO%: 0.3 % (ref 0.0–2.0)
BASOS ABS: 0 10*3/uL (ref 0.0–0.1)
EOS ABS: 0.1 10*3/uL (ref 0.0–0.5)
EOS%: 2.2 % (ref 0.0–7.0)
HCT: 33.5 % — ABNORMAL LOW (ref 34.8–46.6)
HGB: 10.6 g/dL — ABNORMAL LOW (ref 11.6–15.9)
LYMPH%: 14.8 % (ref 14.0–49.7)
MCH: 28 pg (ref 25.1–34.0)
MCHC: 31.6 g/dL (ref 31.5–36.0)
MCV: 88.6 fL (ref 79.5–101.0)
MONO#: 0.3 10*3/uL (ref 0.1–0.9)
MONO%: 8.7 % (ref 0.0–14.0)
NEUT#: 2.6 10*3/uL (ref 1.5–6.5)
NEUT%: 74 % (ref 38.4–76.8)
PLATELETS: 291 10*3/uL (ref 145–400)
RBC: 3.78 10*6/uL (ref 3.70–5.45)
RDW: 20.2 % — ABNORMAL HIGH (ref 11.2–14.5)
WBC: 3.6 10*3/uL — AB (ref 3.9–10.3)
lymph#: 0.5 10*3/uL — ABNORMAL LOW (ref 0.9–3.3)

## 2014-10-21 MED ORDER — PACLITAXEL PROTEIN-BOUND CHEMO INJECTION 100 MG
80.0000 mg/m2 | Freq: Once | INTRAVENOUS | Status: AC
  Administered 2014-10-21: 150 mg via INTRAVENOUS
  Filled 2014-10-21: qty 30

## 2014-10-21 MED ORDER — HEPARIN SOD (PORK) LOCK FLUSH 100 UNIT/ML IV SOLN
500.0000 [IU] | Freq: Once | INTRAVENOUS | Status: AC | PRN
  Administered 2014-10-21: 500 [IU]
  Filled 2014-10-21: qty 5

## 2014-10-21 MED ORDER — SODIUM CHLORIDE 0.9 % IJ SOLN
10.0000 mL | INTRAMUSCULAR | Status: DC | PRN
  Administered 2014-10-21: 10 mL
  Filled 2014-10-21: qty 10

## 2014-10-21 MED ORDER — OXYCODONE-ACETAMINOPHEN 5-325 MG PO TABS: 1.0000 | ORAL_TABLET | ORAL | Status: DC | PRN

## 2014-10-21 MED ORDER — SODIUM CHLORIDE 0.9 % IV SOLN
Freq: Once | INTRAVENOUS | Status: AC
  Administered 2014-10-21: 12:00:00 via INTRAVENOUS

## 2014-10-21 MED ORDER — PALONOSETRON HCL INJECTION 0.25 MG/5ML
0.2500 mg | Freq: Once | INTRAVENOUS | Status: AC
  Administered 2014-10-21: 0.25 mg via INTRAVENOUS

## 2014-10-21 NOTE — Patient Instructions (Signed)
[facility] Discharge Instructions for Patients Receiving Chemotherapy  Today you received the following chemotherapy agents Abraxane To help prevent nausea and vomiting after your treatment, we encourage you to take your nausea medication as prescribed.   If you develop nausea and vomiting that is not controlled by your nausea medication, call the clinic.   BELOW ARE SYMPTOMS THAT SHOULD BE REPORTED IMMEDIATELY:  *FEVER GREATER THAN 100.5 F  *CHILLS WITH OR WITHOUT FEVER  NAUSEA AND VOMITING THAT IS NOT CONTROLLED WITH YOUR NAUSEA MEDICATION  *UNUSUAL SHORTNESS OF BREATH  *UNUSUAL BRUISING OR BLEEDING  TENDERNESS IN MOUTH AND THROAT WITH OR WITHOUT PRESENCE OF ULCERS  *URINARY PROBLEMS  *BOWEL PROBLEMS  UNUSUAL RASH Items with * indicate a potential emergency and should be followed up as soon as possible.  Feel free to call the clinic you have any questions or concerns. The clinic phone number is [phone].  Please show the CHEMO ALERT CARD at check-in to the Emergency Department and triage nurse.   

## 2014-10-21 NOTE — Progress Notes (Signed)
Patient Care Team: Dorena Cookey, MD as PCP - General  DIAGNOSIS: Breast cancer of upper-outer quadrant of right female breast [facility])   Staging form: Breast, AJCC 7th Edition     Clinical: Stage IIA (T2, N0, M0) - Unsigned   SUMMARY OF ONCOLOGIC HISTORY:   Breast cancer of upper-outer quadrant of right female breast ([place])   05/15/2014 Initial Biopsy Right breast biopsy 10:00: Invasive ductal carcinoma grade 3, ER 0% PR 0% HER-2 negative, Ki-67 90%   05/20/2014 Breast MRI Right breast mass: 2.7 x 2.7 x 3.3 cm along with 3 cm non-mass enhancement, upper inner quadrant 8 mm mass, lower inner quadrant 6 mm mass, right axillary lymph nodes 1.2 cm, T2 bright liver lesions   06/17/2014 Surgery Right breast mastectomy: IDC, 3.6 cm, grade 3, 0/4 lymph nodes, T2 N0 M0 stage II a, ER 0%, PR 0%, HER-2 negative, Ki-67 90%   07/22/2014 -  Chemotherapy Adjuvant chemotherapy with dose dense Adriamycin and Cytoxan 4 followed by Abraxane weekly 12    CHIEF COMPLIANT: Abraxane week 6/12  INTERVAL HISTORY: Misty Snyder is a 54 year old with above-mentioned history of right-sided breast cancer treated with mastectomy and is now on adjuvant chemotherapy today is week 6 of Primaxin. She appears to be tolerating it very well. 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 She also has mild discomfort on the bottom of the feet in terms of intermittent pain. She had taken over-the-counter pain pills which have not helped her. She is requesting a prescription for Percocet today.  REVIEW OF SYSTEMS:   Constitutional: Denies fevers, chills or abnormal weight loss Eyes: Denies blurriness of vision Ears, nose, mouth, throat, and face: Denies mucositis or sore throat Respiratory: Denies cough, dyspnea or wheezes Cardiovascular: Denies palpitation, chest discomfort or lower extremity swelling Gastrointestinal:  Denies nausea, heartburn or change in bowel habits Skin: Denies abnormal  skin rashes Lymphatics: Denies new lymphadenopathy or easy bruising Neurological:mild tingling and numbness of the tips of the fingers and toes Behavioral/Psych: Mood is stable, no new changes  Breast:  denies any pain or lumps or nodules in either breasts All other systems were reviewed with the patient and are negative.  I have reviewed the past medical history, past surgical history, social history and family history with the patient and they are unchanged from previous note.  ALLERGIES:  has No Known Allergies.  MEDICATIONS:  Current Outpatient Prescriptions  Medication Sig Dispense Refill  . amphetamine-dextroamphetamine (ADDERALL) 30 MG tablet Take 1 tablet by mouth daily. 30 tablet 0  . citalopram (CELEXA) 40 MG tablet TAKE 1 TABLET (40 MG TOTAL) BY MOUTH DAILY. 90 tablet 3  . dexamethasone (DECADRON) 4 MG tablet Take 1 tablets by mouth once a day on the day after chemotherapy and then take 1 tablets two times a day for 2 days. Take with food. 30 tablet 1  . hydrOXYzine (ATARAX/VISTARIL) 50 MG tablet Take 50 mg by mouth 3 (three) times daily as needed (for sleep).    [place] [facility] lidocaine-prilocaine (EMLA) cream Apply 1 application topically as needed. 30 g 1  . LORazepam (ATIVAN) 0.5 MG tablet Take 1 tablet (0.5 mg total) by mouth every 8 (eight) hours as needed for anxiety (or nausea). 30 tablet 0  . meloxicam (MOBIC) 7.5 MG tablet Take 1 tablet (7.5 mg total) by mouth daily. 30 tablet 0  . oxyCODONE-acetaminophen (PERCOCET/ROXICET) 5-325 MG tablet Take 1 tablet by mouth every 4 (four) hours as needed  for moderate pain. 30 tablet 0  . prochlorperazine (COMPAZINE) 10 MG tablet Take 1 tablet (10 mg total) by mouth every 6 (six) hours as needed. 30 tablet 3   No current facility-administered medications for this visit.    PHYSICAL EXAMINATION: ECOG PERFORMANCE STATUS: 1 - Symptomatic but completely ambulatory  Filed Vitals:   10/21/14 1030  BP: 110/73  Pulse: 95  Temp: 98.5 F (36.9 C)   Resp: 18   Filed Weights   10/21/14 1030  Weight: 187 lb 9.6 oz (85.095 kg)    GENERAL:alert, no distress and comfortable SKIN: skin color, texture, turgor are normal, no rashes or significant lesions EYES: normal, Conjunctiva are pink and non-injected, sclera clear OROPHARYNX:no exudate, no erythema and lips, buccal mucosa, and tongue normal  NECK: supple, thyroid normal size, non-tender, without nodularity LYMPH:  no palpable lymphadenopathy in the cervical, axillary or inguinal LUNGS: clear to auscultation and percussion with normal breathing effort HEART: regular rate & rhythm and no murmurs and no lower extremity edema ABDOMEN:abdomen soft, non-tender and normal bowel sounds Musculoskeletal:no cyanosis of digits and no clubbing  NEURO: alert & oriented x 3 with fluent speech, no focal motor/sensory deficits  LABORATORY DATA:  I have reviewed the data as listed   Chemistry      Component Value Date/Time   NA 142 10/14/2014 1026   NA 136 06/18/2014 0400   K 3.7 10/14/2014 1026   K 3.6 06/18/2014 0400   CL 103 06/18/2014 0400   CO2 25 10/14/2014 1026   CO2 23 06/18/2014 0400   BUN 12.0 10/14/2014 1026   BUN 6 06/18/2014 0400   CREATININE 0.7 10/14/2014 1026   CREATININE 0.76 06/18/2014 0400      Component Value Date/Time   CALCIUM 9.3 10/14/2014 1026   CALCIUM 8.1* 06/18/2014 0400   ALKPHOS 67 10/14/2014 1026   ALKPHOS 66 02/10/2014 1555   AST 33 10/14/2014 1026   AST 16 02/10/2014 1555   ALT 37 10/14/2014 1026   ALT 13 02/10/2014 1555   BILITOT 0.30 10/14/2014 1026   BILITOT 0.3 02/10/2014 1555       Lab Results  Component Value Date   WBC 3.6* 10/21/2014   HGB 10.6* 10/21/2014   HCT 33.5* 10/21/2014   MCV 88.6 10/21/2014   PLT 291 10/21/2014   NEUTROABS 2.6 10/21/2014     ASSESSMENT & PLAN:  Breast cancer of upper-outer quadrant of right female breast Right breast mastectomy 06/17/2014: IDC, 3.6 cm, grade 3, 0/4 lymph nodes, T2 N0 M0 stage II a,  ER 0%, PR 0%, HER-2 negative, Ki-67 90%  Treatment Plan: 1. Adjuvant chemo with dose dense Adriamycin and Cytoxan followed by Abraxane weekly x 12 2. No role of radiation or antiestrogen therapy afterwards because she is ER/PR negative and she had mastectomy previously.  Current treatment: Completed 4 cycles of Adriamycin and Cytoxan dose dense, today is Cycle 6/12 of Abraxane Abraxane Toxicities: 1. Fatigue 2. Lower extremity myalgias: She reports that Mobic has not been very helpful and she takes Aleve twice a day. Monitoring closely for toxicities Peripheral neuropathy grade 1: Being observed Depression: Patient is taking Celexa. She is much happier since she is now able to work 2 days a week. She is worried about risk of infection. 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 I sent a letter stating that she can work 3 days a week.  RTC in 2 weeks For cycle  8/12 Abraxane   No orders of the defined types were placed in this encounter.   The patient has a good understanding of the overall plan. she agrees with it. she will call with any problems that may develop before the next visit here.   Rulon Eisenmenger, MD

## 2014-10-21 NOTE — Telephone Encounter (Signed)
Appointments made and avs pritend for pateint

## 2014-10-21 NOTE — Telephone Encounter (Signed)
Called and left a message with a new appointment time for 10/19 and i have rescheduled her appointment to accomodate the chemo schedule

## 2014-10-21 NOTE — Addendum Note (Signed)
Addended by: Prentiss Bells on: 10/21/2014 11:39 AM   Modules accepted: Medications

## 2014-10-21 NOTE — Assessment & Plan Note (Signed)
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 Adjuvant chemo with dose dense Adriamycin and Cytoxan followed by Abraxane weekly x 12 2. No role of radiation or antiestrogen therapy afterwards because she is ER/PR negative and she had mastectomy previously.  Current treatment: Completed 4 cycles of Adriamycin and Cytoxan dose dense, today is Cycle 6/12 of Abraxane Abraxane Toxicities: 1. Fatigue 2. Lower extremity myalgias: She reports that Mobic has not been very helpful and she takes Aleve twice a day. Monitoring closely for toxicities  Depression: Patient is taking Celexa. She is much happier since she is now able to work 2 days a week. She is worried about risk of infection. 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 

## 2014-10-28 ENCOUNTER — Other Ambulatory Visit (HOSPITAL_BASED_OUTPATIENT_CLINIC_OR_DEPARTMENT_OTHER): Payer: BC Managed Care – PPO

## 2014-10-28 ENCOUNTER — Ambulatory Visit (HOSPITAL_BASED_OUTPATIENT_CLINIC_OR_DEPARTMENT_OTHER): Payer: BC Managed Care – PPO

## 2014-10-28 VITALS — BP 123/78 | HR 98 | Temp 98.4°F | Resp 18

## 2014-10-28 DIAGNOSIS — C50411 Malignant neoplasm of upper-outer quadrant of right female breast: Secondary | ICD-10-CM

## 2014-10-28 DIAGNOSIS — Z5111 Encounter for antineoplastic chemotherapy: Secondary | ICD-10-CM

## 2014-10-28 LAB — COMPREHENSIVE METABOLIC PANEL (CC13)
ALK PHOS: 74 U/L (ref 40–150)
ALT: 34 U/L (ref 0–55)
AST: 35 U/L — AB (ref 5–34)
Albumin: 3.9 g/dL (ref 3.5–5.0)
Anion Gap: 9 mEq/L (ref 3–11)
BUN: 9.2 mg/dL (ref 7.0–26.0)
CALCIUM: 9.2 mg/dL (ref 8.4–10.4)
CHLORIDE: 109 meq/L (ref 98–109)
CO2: 25 mEq/L (ref 22–29)
Creatinine: 0.7 mg/dL (ref 0.6–1.1)
Glucose: 122 mg/dl (ref 70–140)
POTASSIUM: 3.6 meq/L (ref 3.5–5.1)
Sodium: 143 mEq/L (ref 136–145)
Total Bilirubin: 0.32 mg/dL (ref 0.20–1.20)
Total Protein: 7 g/dL (ref 6.4–8.3)

## 2014-10-28 LAB — CBC WITH DIFFERENTIAL/PLATELET
BASO%: 0.3 % (ref 0.0–2.0)
BASOS ABS: 0 10*3/uL (ref 0.0–0.1)
EOS%: 1.8 % (ref 0.0–7.0)
Eosinophils Absolute: 0.1 10*3/uL (ref 0.0–0.5)
HCT: 32.7 % — ABNORMAL LOW (ref 34.8–46.6)
HGB: 10.3 g/dL — ABNORMAL LOW (ref 11.6–15.9)
LYMPH%: 12.8 % — AB (ref 14.0–49.7)
MCH: 28 pg (ref 25.1–34.0)
MCHC: 31.5 g/dL (ref 31.5–36.0)
MCV: 88.9 fL (ref 79.5–101.0)
MONO#: 0.4 10*3/uL (ref 0.1–0.9)
MONO%: 9.9 % (ref 0.0–14.0)
NEUT#: 2.9 10*3/uL (ref 1.5–6.5)
NEUT%: 75.2 % (ref 38.4–76.8)
PLATELETS: 279 10*3/uL (ref 145–400)
RBC: 3.68 10*6/uL — ABNORMAL LOW (ref 3.70–5.45)
RDW: 19.7 % — ABNORMAL HIGH (ref 11.2–14.5)
WBC: 3.8 10*3/uL — ABNORMAL LOW (ref 3.9–10.3)
lymph#: 0.5 10*3/uL — ABNORMAL LOW (ref 0.9–3.3)

## 2014-10-28 MED ORDER — SODIUM CHLORIDE 0.9 % IJ SOLN
10.0000 mL | INTRAMUSCULAR | Status: DC | PRN
  Administered 2014-10-28: 10 mL
  Filled 2014-10-28: qty 10

## 2014-10-28 MED ORDER — PACLITAXEL PROTEIN-BOUND CHEMO INJECTION 100 MG
80.0000 mg/m2 | Freq: Once | INTRAVENOUS | Status: AC
  Administered 2014-10-28: 150 mg via INTRAVENOUS
  Filled 2014-10-28: qty 30

## 2014-10-28 MED ORDER — PALONOSETRON HCL INJECTION 0.25 MG/5ML
0.2500 mg | Freq: Once | INTRAVENOUS | Status: AC
  Administered 2014-10-28: 0.25 mg via INTRAVENOUS

## 2014-10-28 MED ORDER — SODIUM CHLORIDE 0.9 % IV SOLN
Freq: Once | INTRAVENOUS | Status: AC
  Administered 2014-10-28: 12:00:00 via INTRAVENOUS

## 2014-10-28 MED ORDER — HEPARIN SOD (PORK) LOCK FLUSH 100 UNIT/ML IV SOLN
500.0000 [IU] | Freq: Once | INTRAVENOUS | Status: AC | PRN
  Administered 2014-10-28: 500 [IU]
  Filled 2014-10-28: qty 5

## 2014-10-28 MED ORDER — PALONOSETRON HCL INJECTION 0.25 MG/5ML
INTRAVENOUS | Status: AC
  Filled 2014-10-28: qty 5

## 2014-10-28 NOTE — Patient Instructions (Signed)
[facility] Discharge Instructions for Patients Receiving Chemotherapy  Today you received the following chemotherapy agents Abraxane  To help prevent nausea and vomiting after your treatment, we encourage you to take your nausea medication Compazine 10mg  every 6hrs as needed.   If you develop nausea and vomiting that is not controlled by your nausea medication, call the clinic.   BELOW ARE SYMPTOMS THAT SHOULD BE REPORTED IMMEDIATELY:  *FEVER GREATER THAN 100.5 F  *CHILLS WITH OR WITHOUT FEVER  NAUSEA AND VOMITING THAT IS NOT CONTROLLED WITH YOUR NAUSEA MEDICATION  *UNUSUAL SHORTNESS OF BREATH  *UNUSUAL BRUISING OR BLEEDING  TENDERNESS IN MOUTH AND THROAT WITH OR WITHOUT PRESENCE OF ULCERS  *URINARY PROBLEMS  *BOWEL PROBLEMS  UNUSUAL RASH Items with * indicate a potential emergency and should be followed up as soon as possible.  Feel free to call the clinic you have any questions or concerns. The clinic phone number is [phone].  Please show the [place] at check-in to the Emergency Department and triage nurse.

## 2014-11-03 NOTE — Assessment & Plan Note (Signed)
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 Adjuvant chemo with dose dense Adriamycin and Cytoxan followed by Abraxane weekly x 12 2. No role of radiation or antiestrogen therapy afterwards because she is ER/PR negative and she had mastectomy previously.  Current treatment: Completed 4 cycles of Adriamycin and Cytoxan dose dense, today is Cycle 8/12 of Abraxane Abraxane Toxicities: 1. Fatigue 2. Lower extremity myalgias: She reports that Mobic has not been very helpful and she takes Aleve twice a day. Monitoring closely for toxicities Peripheral neuropathy grade 1: Being observed Depression: Patient is taking Celexa. She is much happier since she is now able to work 2 days a week. She is worried about risk of infection. 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 I sent a letter stating that she can work 3 days a week.  RTC in 2 weeks For cycle 10/12 Abraxane

## 2014-11-04 ENCOUNTER — Ambulatory Visit (HOSPITAL_BASED_OUTPATIENT_CLINIC_OR_DEPARTMENT_OTHER): Payer: BC Managed Care – PPO

## 2014-11-04 ENCOUNTER — Encounter: Payer: Self-pay | Admitting: Hematology and Oncology

## 2014-11-04 ENCOUNTER — Ambulatory Visit: Payer: BC Managed Care – PPO | Admitting: Hematology and Oncology

## 2014-11-04 ENCOUNTER — Ambulatory Visit (HOSPITAL_BASED_OUTPATIENT_CLINIC_OR_DEPARTMENT_OTHER): Payer: BC Managed Care – PPO | Admitting: Hematology and Oncology

## 2014-11-04 ENCOUNTER — Other Ambulatory Visit: Payer: BC Managed Care – PPO

## 2014-11-04 ENCOUNTER — Telehealth: Payer: Self-pay | Admitting: Hematology and Oncology

## 2014-11-04 ENCOUNTER — Other Ambulatory Visit (HOSPITAL_BASED_OUTPATIENT_CLINIC_OR_DEPARTMENT_OTHER): Payer: BC Managed Care – PPO

## 2014-11-04 VITALS — BP 128/76 | HR 92 | Temp 98.1°F | Resp 18 | Ht 63.0 in | Wt 188.2 lb

## 2014-11-04 DIAGNOSIS — C50411 Malignant neoplasm of upper-outer quadrant of right female breast: Secondary | ICD-10-CM

## 2014-11-04 DIAGNOSIS — Z5111 Encounter for antineoplastic chemotherapy: Secondary | ICD-10-CM | POA: Diagnosis not present

## 2014-11-04 DIAGNOSIS — D701 Agranulocytosis secondary to cancer chemotherapy: Secondary | ICD-10-CM

## 2014-11-04 DIAGNOSIS — G620 Drug-induced polyneuropathy: Secondary | ICD-10-CM | POA: Diagnosis not present

## 2014-11-04 DIAGNOSIS — R740 Nonspecific elevation of levels of transaminase and lactic acid dehydrogenase [LDH]: Secondary | ICD-10-CM

## 2014-11-04 DIAGNOSIS — R530 Neoplastic (malignant) related fatigue: Secondary | ICD-10-CM | POA: Diagnosis not present

## 2014-11-04 DIAGNOSIS — T451X5A Adverse effect of antineoplastic and immunosuppressive drugs, initial encounter: Secondary | ICD-10-CM

## 2014-11-04 LAB — COMPREHENSIVE METABOLIC PANEL (CC13)
ALBUMIN: 3.9 g/dL (ref 3.5–5.0)
ALT: 22 U/L (ref 0–55)
AST: 23 U/L (ref 5–34)
Alkaline Phosphatase: 71 U/L (ref 40–150)
Anion Gap: 12 mEq/L — ABNORMAL HIGH (ref 3–11)
BUN: 8.8 mg/dL (ref 7.0–26.0)
CALCIUM: 9.2 mg/dL (ref 8.4–10.4)
CHLORIDE: 107 meq/L (ref 98–109)
CO2: 22 mEq/L (ref 22–29)
CREATININE: 0.8 mg/dL (ref 0.6–1.1)
EGFR: >90 mL/min/{1.73_m2} (ref >90)
GLUCOSE: 158 mg/dL — AB (ref 70–140)
POTASSIUM: 3.4 meq/L — AB (ref 3.5–5.1)
SODIUM: 141 meq/L (ref 136–145)
Total Bilirubin: 0.34 mg/dL (ref 0.20–1.20)
Total Protein: 7.3 g/dL (ref 6.4–8.3)

## 2014-11-04 LAB — CBC WITH DIFFERENTIAL/PLATELET
BASO%: 0.3 % (ref 0.0–2.0)
BASOS ABS: 0 10*3/uL (ref 0.0–0.1)
EOS%: 1.2 % (ref 0.0–7.0)
Eosinophils Absolute: 0 10*3/uL (ref 0.0–0.5)
HEMATOCRIT: 33.9 % — AB (ref 34.8–46.6)
HEMOGLOBIN: 10.7 g/dL — AB (ref 11.6–15.9)
LYMPH#: 0.5 10*3/uL — AB (ref 0.9–3.3)
LYMPH%: 15.3 % (ref 14.0–49.7)
MCH: 28.5 pg (ref 25.1–34.0)
MCHC: 31.6 g/dL (ref 31.5–36.0)
MCV: 90.2 fL (ref 79.5–101.0)
MONO#: 0.4 10*3/uL (ref 0.1–0.9)
MONO%: 12.8 % (ref 0.0–14.0)
NEUT#: 2.3 10*3/uL (ref 1.5–6.5)
NEUT%: 70.4 % (ref 38.4–76.8)
Platelets: 281 10*3/uL (ref 145–400)
RBC: 3.76 10*6/uL (ref 3.70–5.45)
RDW: 19 % — AB (ref 11.2–14.5)
WBC: 3.2 10*3/uL — AB (ref 3.9–10.3)

## 2014-11-04 MED ORDER — SODIUM CHLORIDE 0.9 % IJ SOLN
10.0000 mL | INTRAMUSCULAR | Status: DC | PRN
  Administered 2014-11-04: 10 mL
  Filled 2014-11-04: qty 10

## 2014-11-04 MED ORDER — PALONOSETRON HCL INJECTION 0.25 MG/5ML
INTRAVENOUS | Status: AC
  Filled 2014-11-04: qty 5

## 2014-11-04 MED ORDER — NAPROXEN 500 MG PO TABS: 500.0000 mg | ORAL_TABLET | Freq: Two times a day (BID) | ORAL | Status: DC

## 2014-11-04 MED ORDER — SODIUM CHLORIDE 0.9 % IV SOLN
Freq: Once | INTRAVENOUS | Status: AC
  Administered 2014-11-04: 09:00:00 via INTRAVENOUS

## 2014-11-04 MED ORDER — PACLITAXEL PROTEIN-BOUND CHEMO INJECTION 100 MG
80.0000 mg/m2 | Freq: Once | INTRAVENOUS | Status: AC
  Administered 2014-11-04: 150 mg via INTRAVENOUS
  Filled 2014-11-04: qty 30

## 2014-11-04 MED ORDER — PALONOSETRON HCL INJECTION 0.25 MG/5ML
0.2500 mg | Freq: Once | INTRAVENOUS | Status: AC
  Administered 2014-11-04: 0.25 mg via INTRAVENOUS

## 2014-11-04 MED ORDER — HEPARIN SOD (PORK) LOCK FLUSH 100 UNIT/ML IV SOLN
500.0000 [IU] | Freq: Once | INTRAVENOUS | Status: AC | PRN
  Administered 2014-11-04: 500 [IU]
  Filled 2014-11-04: qty 5

## 2014-11-04 NOTE — Addendum Note (Signed)
Addended by: Prentiss Bells on: 11/04/2014 09:27 AM   Modules accepted: Medications

## 2014-11-04 NOTE — Patient Instructions (Signed)
[facility] Discharge Instructions for Patients Receiving Chemotherapy  Today you received the following chemotherapy agents: Abraxane   To help prevent nausea and vomiting after your treatment, we encourage you to take your nausea medication as directed.    If you develop nausea and vomiting that is not controlled by your nausea medication, call the clinic.   BELOW ARE SYMPTOMS THAT SHOULD BE REPORTED IMMEDIATELY:  *FEVER GREATER THAN 100.5 F  *CHILLS WITH OR WITHOUT FEVER  NAUSEA AND VOMITING THAT IS NOT CONTROLLED WITH YOUR NAUSEA MEDICATION  *UNUSUAL SHORTNESS OF BREATH  *UNUSUAL BRUISING OR BLEEDING  TENDERNESS IN MOUTH AND THROAT WITH OR WITHOUT PRESENCE OF ULCERS  *URINARY PROBLEMS  *BOWEL PROBLEMS  UNUSUAL RASH Items with * indicate a potential emergency and should be followed up as soon as possible.  Feel free to call the clinic you have any questions or concerns. The clinic phone number is [phone].  Please show the CHEMO ALERT CARD at check-in to the Emergency Department and triage nurse.   

## 2014-11-04 NOTE — Progress Notes (Signed)
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-2 negative, Ki-67 90%   05/20/2014 Breast MRI Right breast mass: 2.7 x 2.7 x 3.3 cm along with 3 cm non-mass enhancement, upper inner quadrant 8 mm mass, lower inner quadrant 6 mm mass, right axillary lymph nodes 1.2 cm, T2 bright liver lesions   06/17/2014 Surgery Right breast mastectomy: IDC, 3.6 cm, grade 3, 0/4 lymph nodes, T2 N0 M0 stage II a, ER 0%, PR 0%, HER-2 negative, Ki-67 90%   07/22/2014 -  Chemotherapy Adjuvant chemotherapy with dose dense Adriamycin and Cytoxan 4 followed by Abraxane weekly 12    CHIEF COMPLIANT: Cycle 8 Abraxane  INTERVAL HISTORY: Misty Snyder is a 54 year old with above-mentioned history of right breast cancer currently on adjuvant chemotherapy. Today is cycle 8 of Abraxane. She has been tolerating it extremely well. She has occasional numbness of the tips of the fingers but it goes away. She does not have any pain or discomfort. She is darkening of nail but has not had any problems with elevation of the nail bed. Denies any nausea or vomiting. Her appetite has come back as well as the taste.  REVIEW OF SYSTEMS:   Constitutional: Denies fevers, chills or abnormal weight loss Eyes: Denies blurriness of vision Ears, nose, mouth, throat, and face: Denies mucositis or sore throat Respiratory: Denies cough, dyspnea or wheezes Cardiovascular: Denies palpitation, chest discomfort or lower extremity swelling Gastrointestinal:  Denies nausea, heartburn or change in bowel habits Skin: Denies abnormal skin rashes Lymphatics: Denies new  lymphadenopathy or easy bruising Neurological: Intermittent numbness of the tips of the fingers Behavioral/Psych: Mood is stable, no new changes  Breast:  denies any pain or lumps or nodules in either breasts All other systems were reviewed with the patient and are negative.  I have reviewed the past medical history, past surgical history, social history and family history with the patient and they are unchanged from previous note.  ALLERGIES:  has No Known Allergies.  MEDICATIONS:  Current Outpatient Prescriptions  Medication Sig Dispense Refill  . amphetamine-dextroamphetamine (ADDERALL) 30 MG tablet Take 1 tablet by mouth daily. 30 tablet 0  . citalopram (CELEXA) 40 MG tablet TAKE 1 TABLET (40 MG TOTAL) BY MOUTH DAILY. 90 tablet 3  . dexamethasone (DECADRON) 4 MG tablet Take 1 tablets by mouth once a day on the day after chemotherapy and then take 1 tablets two times a day for 2 days. Take with food. 30 tablet 1  . hydrOXYzine (ATARAX/VISTARIL) 50 MG tablet Take 50 mg by mouth 3 (three) times daily as needed (for sleep).    Marland Kitchen lidocaine-prilocaine (EMLA) cream Apply 1 application topically as needed. 30 g 1  . LORazepam (ATIVAN) 0.5 MG tablet Take 1 tablet (0.5 mg total) by mouth every 8 (eight) hours as needed for anxiety (or nausea). 30 tablet 0  . meloxicam (MOBIC) 7.5 MG tablet Take 1 tablet (7.5 mg total) by mouth daily. 30 tablet 0  . oxyCODONE-acetaminophen (PERCOCET/ROXICET) 5-325 MG tablet Take 1 tablet by mouth every 4 (four) hours as needed for moderate pain. 30 tablet  0  . prochlorperazine (COMPAZINE) 10 MG tablet Take 1 tablet (10 mg total) by mouth every 6 (six) hours as needed. 30 tablet 3   No current facility-administered medications for this visit.    PHYSICAL EXAMINATION: ECOG PERFORMANCE STATUS: 1 - Symptomatic but completely ambulatory  Filed Vitals:   11/04/14 0815  BP: 128/76  Pulse: 92  Temp: 98.1 F (36.7 C)  Resp: 18   Filed Weights   11/04/14 0815   Weight: 188 lb 3.2 oz (85.367 kg)    GENERAL:alert, no distress and comfortable SKIN: skin color, texture, turgor are normal, no rashes or significant lesions EYES: normal, Conjunctiva are pink and non-injected, sclera clear OROPHARYNX:no exudate, no erythema and lips, buccal mucosa, and tongue normal  NECK: supple, thyroid normal size, non-tender, without nodularity LYMPH:  no palpable lymphadenopathy in the cervical, axillary or inguinal LUNGS: clear to auscultation and percussion with normal breathing effort HEART: regular rate & rhythm and no murmurs and no lower extremity edema ABDOMEN:abdomen soft, non-tender and normal bowel sounds Musculoskeletal:no cyanosis of digits and no clubbing  NEURO: alert & oriented x 3 with fluent speech, grade 1 neuropathy   LABORATORY DATA:  I have reviewed the data as listed   Chemistry      Component Value Date/Time   NA 143 10/28/2014 1014   NA 136 06/18/2014 0400   K 3.6 10/28/2014 1014   K 3.6 06/18/2014 0400   CL 103 06/18/2014 0400   CO2 25 10/28/2014 1014   CO2 23 06/18/2014 0400   BUN 9.2 10/28/2014 1014   BUN 6 06/18/2014 0400   CREATININE 0.7 10/28/2014 1014   CREATININE 0.76 06/18/2014 0400      Component Value Date/Time   CALCIUM 9.2 10/28/2014 1014   CALCIUM 8.1* 06/18/2014 0400   ALKPHOS 74 10/28/2014 1014   ALKPHOS 66 02/10/2014 1555   AST 35* 10/28/2014 1014   AST 16 02/10/2014 1555   ALT 34 10/28/2014 1014   ALT 13 02/10/2014 1555   BILITOT 0.32 10/28/2014 1014   BILITOT 0.3 02/10/2014 1555       Lab Results  Component Value Date   WBC 3.2* 11/04/2014   HGB 10.7* 11/04/2014   HCT 33.9* 11/04/2014   MCV 90.2 11/04/2014   PLT 281 11/04/2014   NEUTROABS 2.3 11/04/2014    ASSESSMENT & PLAN:  Breast cancer of upper-outer quadrant of right female breast Right breast mastectomy 06/17/2014: IDC, 3.6 cm, grade 3, 0/4 lymph nodes, T2 N0 M0 stage II a, ER 0%, PR 0%, HER-2 negative, Ki-67 90%  Treatment  Plan: 1. Adjuvant chemo with dose dense Adriamycin and Cytoxan followed by Abraxane weekly x 12 2. No role of radiation or antiestrogen therapy afterwards because she is ER/PR negative and she had mastectomy previously.  Current treatment: Completed 4 cycles of Adriamycin and Cytoxan dose dense, today is Cycle 8/12 of Abraxane Abraxane Toxicities: 1. Fatigue 2. Lower extremity myalgias: She reports that Mobic has not been very helpful and she takes Aleve twice a day. 3. Leukopenia related chemotherapy being monitored 4. Elevation of AST: Also being monitored 5. 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  No orders of the defined types were placed in this encounter.   The patient has  a good understanding of the overall plan. she agrees with it. she will call with any problems that may develop before the next visit here.   Rulon Eisenmenger, MD 11/04/2014

## 2014-11-04 NOTE — Telephone Encounter (Signed)
Appointments made and avs printed for patient °

## 2014-11-04 NOTE — Progress Notes (Signed)
Potassium 3.4. Dr. Lindi Adie aware, no new orders at this time, okay to proceed with treatment per Dr. Leonel Ramsay.

## 2014-11-11 ENCOUNTER — Telehealth: Payer: Self-pay | Admitting: *Deleted

## 2014-11-11 ENCOUNTER — Other Ambulatory Visit (HOSPITAL_BASED_OUTPATIENT_CLINIC_OR_DEPARTMENT_OTHER): Payer: BC Managed Care – PPO

## 2014-11-11 ENCOUNTER — Ambulatory Visit (HOSPITAL_BASED_OUTPATIENT_CLINIC_OR_DEPARTMENT_OTHER): Payer: BC Managed Care – PPO

## 2014-11-11 VITALS — BP 119/85 | HR 91 | Temp 97.0°F | Resp 18

## 2014-11-11 DIAGNOSIS — Z5111 Encounter for antineoplastic chemotherapy: Secondary | ICD-10-CM

## 2014-11-11 DIAGNOSIS — C50411 Malignant neoplasm of upper-outer quadrant of right female breast: Secondary | ICD-10-CM

## 2014-11-11 LAB — CBC WITH DIFFERENTIAL/PLATELET
BASO%: 0.3 % (ref 0.0–2.0)
BASOS ABS: 0 10*3/uL (ref 0.0–0.1)
EOS ABS: 0 10*3/uL (ref 0.0–0.5)
EOS%: 1 % (ref 0.0–7.0)
HCT: 35.6 % (ref 34.8–46.6)
HGB: 11.1 g/dL — ABNORMAL LOW (ref 11.6–15.9)
LYMPH%: 12.5 % — AB (ref 14.0–49.7)
MCH: 28.1 pg (ref 25.1–34.0)
MCHC: 31.2 g/dL — AB (ref 31.5–36.0)
MCV: 90.1 fL (ref 79.5–101.0)
MONO#: 0.4 10*3/uL (ref 0.1–0.9)
MONO%: 9.2 % (ref 0.0–14.0)
NEUT#: 3 10*3/uL (ref 1.5–6.5)
NEUT%: 77 % — AB (ref 38.4–76.8)
Platelets: 322 10*3/uL (ref 145–400)
RBC: 3.95 10*6/uL (ref 3.70–5.45)
RDW: 18.5 % — AB (ref 11.2–14.5)
WBC: 3.9 10*3/uL (ref 3.9–10.3)
lymph#: 0.5 10*3/uL — ABNORMAL LOW (ref 0.9–3.3)

## 2014-11-11 LAB — COMPREHENSIVE METABOLIC PANEL (CC13)
ALBUMIN: 4.2 g/dL (ref 3.5–5.0)
ALK PHOS: 76 U/L (ref 40–150)
ALT: 30 U/L (ref 0–55)
AST: 29 U/L (ref 5–34)
Anion Gap: 10 mEq/L (ref 3–11)
BUN: 9.5 mg/dL (ref 7.0–26.0)
CHLORIDE: 110 meq/L — AB (ref 98–109)
CO2: 24 mEq/L (ref 22–29)
Calcium: 9.5 mg/dL (ref 8.4–10.4)
Creatinine: 0.7 mg/dL (ref 0.6–1.1)
GLUCOSE: 108 mg/dL (ref 70–140)
POTASSIUM: 3.8 meq/L (ref 3.5–5.1)
SODIUM: 144 meq/L (ref 136–145)
Total Bilirubin: 0.31 mg/dL (ref 0.20–1.20)
Total Protein: 7.4 g/dL (ref 6.4–8.3)

## 2014-11-11 MED ORDER — HEPARIN SOD (PORK) LOCK FLUSH 100 UNIT/ML IV SOLN
500.0000 [IU] | Freq: Once | INTRAVENOUS | Status: AC | PRN
  Administered 2014-11-11: 500 [IU]
  Filled 2014-11-11: qty 5

## 2014-11-11 MED ORDER — SODIUM CHLORIDE 0.9 % IJ SOLN
10.0000 mL | INTRAMUSCULAR | Status: DC | PRN
  Administered 2014-11-11: 10 mL
  Filled 2014-11-11: qty 10

## 2014-11-11 MED ORDER — PALONOSETRON HCL INJECTION 0.25 MG/5ML
0.2500 mg | Freq: Once | INTRAVENOUS | Status: AC
  Administered 2014-11-11: 0.25 mg via INTRAVENOUS

## 2014-11-11 MED ORDER — PACLITAXEL PROTEIN-BOUND CHEMO INJECTION 100 MG
80.0000 mg/m2 | Freq: Once | INTRAVENOUS | Status: AC
  Administered 2014-11-11: 150 mg via INTRAVENOUS
  Filled 2014-11-11: qty 30

## 2014-11-11 MED ORDER — SODIUM CHLORIDE 0.9 % IV SOLN
Freq: Once | INTRAVENOUS | Status: AC
  Administered 2014-11-11: 11:00:00 via INTRAVENOUS

## 2014-11-11 MED ORDER — PALONOSETRON HCL INJECTION 0.25 MG/5ML
INTRAVENOUS | Status: AC
  Filled 2014-11-11: qty 5

## 2014-11-11 NOTE — Patient Instructions (Signed)
[facility] Discharge Instructions for Patients Receiving Chemotherapy  Today you received the following chemotherapy agents: Abraxane   To help prevent nausea and vomiting after your treatment, we encourage you to take your nausea medication as directed.    If you develop nausea and vomiting that is not controlled by your nausea medication, call the clinic.   BELOW ARE SYMPTOMS THAT SHOULD BE REPORTED IMMEDIATELY:  *FEVER GREATER THAN 100.5 F  *CHILLS WITH OR WITHOUT FEVER  NAUSEA AND VOMITING THAT IS NOT CONTROLLED WITH YOUR NAUSEA MEDICATION  *UNUSUAL SHORTNESS OF BREATH  *UNUSUAL BRUISING OR BLEEDING  TENDERNESS IN MOUTH AND THROAT WITH OR WITHOUT PRESENCE OF ULCERS  *URINARY PROBLEMS  *BOWEL PROBLEMS  UNUSUAL RASH Items with * indicate a potential emergency and should be followed up as soon as possible.  Feel free to call the clinic you have any questions or concerns. The clinic phone number is [phone].  Please show the CHEMO ALERT CARD at check-in to the Emergency Department and triage nurse.   

## 2014-11-11 NOTE — Telephone Encounter (Signed)
Return pt call concerning flu shot. Informed pt she may have her flu shot. Confirmed future appts. Denies further needs at this time.

## 2014-11-13 ENCOUNTER — Telehealth: Payer: Self-pay | Admitting: *Deleted

## 2014-11-13 NOTE — Telephone Encounter (Signed)
Pt called asking if she would be able to go to a dinner party. Discussed taking precautions. Received verbal understanding.

## 2014-11-17 NOTE — Assessment & Plan Note (Signed)
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 Adjuvant chemo with dose dense Adriamycin and Cytoxan followed by Abraxane weekly x 12 2. No role of radiation or antiestrogen therapy afterwards because she is ER/PR negative and she had mastectomy previously.  Current treatment: Completed 4 cycles of Adriamycin and Cytoxan dose dense, today is Cycle 10/12 of Abraxane Abraxane Toxicities: 1. Fatigue 2. Lower extremity myalgias: She reports that Mobic has not been very helpful and she takes Aleve twice a day. 3. Leukopenia related chemotherapy being monitored 4. Elevation of AST: Also being monitored 5. 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

## 2014-11-18 ENCOUNTER — Telehealth: Payer: Self-pay | Admitting: Hematology and Oncology

## 2014-11-18 ENCOUNTER — Other Ambulatory Visit: Payer: BC Managed Care – PPO

## 2014-11-18 ENCOUNTER — Ambulatory Visit: Payer: BC Managed Care – PPO

## 2014-11-18 ENCOUNTER — Ambulatory Visit: Payer: BC Managed Care – PPO | Admitting: Hematology and Oncology

## 2014-11-18 ENCOUNTER — Ambulatory Visit: Payer: Self-pay | Admitting: Surgery

## 2014-11-18 ENCOUNTER — Other Ambulatory Visit (HOSPITAL_BASED_OUTPATIENT_CLINIC_OR_DEPARTMENT_OTHER): Payer: BC Managed Care – PPO

## 2014-11-18 ENCOUNTER — Ambulatory Visit (HOSPITAL_BASED_OUTPATIENT_CLINIC_OR_DEPARTMENT_OTHER): Payer: BC Managed Care – PPO

## 2014-11-18 ENCOUNTER — Encounter: Payer: Self-pay | Admitting: Hematology and Oncology

## 2014-11-18 ENCOUNTER — Ambulatory Visit (HOSPITAL_BASED_OUTPATIENT_CLINIC_OR_DEPARTMENT_OTHER): Payer: BC Managed Care – PPO | Admitting: Hematology and Oncology

## 2014-11-18 VITALS — BP 108/69 | HR 92 | Temp 98.0°F | Resp 18 | Ht 63.0 in | Wt 186.8 lb

## 2014-11-18 DIAGNOSIS — C50411 Malignant neoplasm of upper-outer quadrant of right female breast: Secondary | ICD-10-CM

## 2014-11-18 DIAGNOSIS — D701 Agranulocytosis secondary to cancer chemotherapy: Secondary | ICD-10-CM

## 2014-11-18 DIAGNOSIS — Z5111 Encounter for antineoplastic chemotherapy: Secondary | ICD-10-CM

## 2014-11-18 DIAGNOSIS — R530 Neoplastic (malignant) related fatigue: Secondary | ICD-10-CM | POA: Diagnosis not present

## 2014-11-18 DIAGNOSIS — R748 Abnormal levels of other serum enzymes: Secondary | ICD-10-CM

## 2014-11-18 DIAGNOSIS — G620 Drug-induced polyneuropathy: Secondary | ICD-10-CM | POA: Diagnosis not present

## 2014-11-18 DIAGNOSIS — F329 Major depressive disorder, single episode, unspecified: Secondary | ICD-10-CM

## 2014-11-18 LAB — COMPREHENSIVE METABOLIC PANEL (CC13)
ALBUMIN: 4 g/dL (ref 3.5–5.0)
ALT: 32 U/L (ref 0–55)
AST: 29 U/L (ref 5–34)
Alkaline Phosphatase: 68 U/L (ref 40–150)
Anion Gap: 9 mEq/L (ref 3–11)
BILIRUBIN TOTAL: 0.35 mg/dL (ref 0.20–1.20)
BUN: 13.1 mg/dL (ref 7.0–26.0)
CALCIUM: 9.4 mg/dL (ref 8.4–10.4)
CO2: 25 mEq/L (ref 22–29)
Chloride: 109 mEq/L (ref 98–109)
Creatinine: 0.7 mg/dL (ref 0.6–1.1)
GLUCOSE: 108 mg/dL (ref 70–140)
POTASSIUM: 3.5 meq/L (ref 3.5–5.1)
Sodium: 143 mEq/L (ref 136–145)
Total Protein: 7.1 g/dL (ref 6.4–8.3)

## 2014-11-18 LAB — CBC WITH DIFFERENTIAL/PLATELET
BASO%: 0.8 % (ref 0.0–2.0)
BASOS ABS: 0 10*3/uL (ref 0.0–0.1)
EOS ABS: 0 10*3/uL (ref 0.0–0.5)
EOS%: 1.3 % (ref 0.0–7.0)
HEMATOCRIT: 32.7 % — AB (ref 34.8–46.6)
HEMOGLOBIN: 10.5 g/dL — AB (ref 11.6–15.9)
LYMPH#: 0.4 10*3/uL — AB (ref 0.9–3.3)
LYMPH%: 11.3 % — ABNORMAL LOW (ref 14.0–49.7)
MCH: 28.4 pg (ref 25.1–34.0)
MCHC: 32.2 g/dL (ref 31.5–36.0)
MCV: 88 fL (ref 79.5–101.0)
MONO#: 0.4 10*3/uL (ref 0.1–0.9)
MONO%: 11.3 % (ref 0.0–14.0)
NEUT#: 2.4 10*3/uL (ref 1.5–6.5)
NEUT%: 75.3 % (ref 38.4–76.8)
Platelets: 289 10*3/uL (ref 145–400)
RBC: 3.71 10*6/uL (ref 3.70–5.45)
RDW: 18.5 % — ABNORMAL HIGH (ref 11.2–14.5)
WBC: 3.2 10*3/uL — ABNORMAL LOW (ref 3.9–10.3)

## 2014-11-18 MED ORDER — PALONOSETRON HCL INJECTION 0.25 MG/5ML
0.2500 mg | Freq: Once | INTRAVENOUS | Status: AC
Start: 2014-11-18 — End: 2014-11-18
  Administered 2014-11-18: 0.25 mg via INTRAVENOUS

## 2014-11-18 MED ORDER — PALONOSETRON HCL INJECTION 0.25 MG/5ML
INTRAVENOUS | Status: AC
  Filled 2014-11-18: qty 5

## 2014-11-18 MED ORDER — HEPARIN SOD (PORK) LOCK FLUSH 100 UNIT/ML IV SOLN
500.0000 [IU] | Freq: Once | INTRAVENOUS | Status: AC | PRN
  Administered 2014-11-18: 500 [IU]
  Filled 2014-11-18: qty 5

## 2014-11-18 MED ORDER — PACLITAXEL PROTEIN-BOUND CHEMO INJECTION 100 MG
80.0000 mg/m2 | Freq: Once | INTRAVENOUS | Status: AC
  Administered 2014-11-18: 150 mg via INTRAVENOUS
  Filled 2014-11-18: qty 30

## 2014-11-18 MED ORDER — SODIUM CHLORIDE 0.9 % IV SOLN
Freq: Once | INTRAVENOUS | Status: AC
  Administered 2014-11-18: 09:00:00 via INTRAVENOUS

## 2014-11-18 MED ORDER — SODIUM CHLORIDE 0.9 % IJ SOLN
10.0000 mL | INTRAMUSCULAR | Status: DC | PRN
  Administered 2014-11-18: 10 mL
  Filled 2014-11-18: qty 10

## 2014-11-18 NOTE — Telephone Encounter (Signed)
Per pof patient is on schedule already scheduled

## 2014-11-18 NOTE — Patient Instructions (Signed)
[facility] Discharge Instructions for Patients Receiving Chemotherapy  Today you received the following chemotherapy agents: Abraxane   To help prevent nausea and vomiting after your treatment, we encourage you to take your nausea medication as directed.    If you develop nausea and vomiting that is not controlled by your nausea medication, call the clinic.   BELOW ARE SYMPTOMS THAT SHOULD BE REPORTED IMMEDIATELY:  *FEVER GREATER THAN 100.5 F  *CHILLS WITH OR WITHOUT FEVER  NAUSEA AND VOMITING THAT IS NOT CONTROLLED WITH YOUR NAUSEA MEDICATION  *UNUSUAL SHORTNESS OF BREATH  *UNUSUAL BRUISING OR BLEEDING  TENDERNESS IN MOUTH AND THROAT WITH OR WITHOUT PRESENCE OF ULCERS  *URINARY PROBLEMS  *BOWEL PROBLEMS  UNUSUAL RASH Items with * indicate a potential emergency and should be followed up as soon as possible.  Feel free to call the clinic you have any questions or concerns. The clinic phone number is [phone].  Please show the CHEMO ALERT CARD at check-in to the Emergency Department and triage nurse.   

## 2014-11-18 NOTE — Addendum Note (Signed)
Addended by: Prentiss Bells on: 11/18/2014 09:29 AM   Modules accepted: Medications

## 2014-11-18 NOTE — Progress Notes (Signed)
Patient Care Team: Dorena Cookey, MD as PCP - General  DIAGNOSIS: Breast cancer of upper-outer quadrant of right female breast [facility])   Staging form: Breast, AJCC 7th Edition     Clinical: Stage IIA (T2, N0, M0) - Unsigned   SUMMARY OF ONCOLOGIC HISTORY:   Breast cancer of upper-outer quadrant of right female breast ([place])   05/15/2014 Initial Biopsy Right breast biopsy 10:00: Invasive ductal carcinoma grade 3, ER 0% PR 0% HER-2 negative, Ki-67 90%   05/20/2014 Breast MRI Right breast mass: 2.7 x 2.7 x 3.3 cm along with 3 cm non-mass enhancement, upper inner quadrant 8 mm mass, lower inner quadrant 6 mm mass, right axillary lymph nodes 1.2 cm, T2 bright liver lesions   06/17/2014 Surgery Right breast mastectomy: IDC, 3.6 cm, grade 3, 0/4 lymph nodes, T2 N0 M0 stage II a, ER 0%, PR 0%, HER-2 negative, Ki-67 90%   07/22/2014 -  Chemotherapy Adjuvant chemotherapy with dose dense Adriamycin and Cytoxan 4 followed by Abraxane weekly 12    CHIEF COMPLIANT: Abraxane cycle 10  INTERVAL HISTORY: Misty Snyder is a 54 year old with above-mentioned history of right breast cancer currently on adjuvant chemotherapy. Today is cycle 10 of Abraxane. She appears to be tolerating it very well. She continues to have fatigue, lower extremity aches and pains as well as mild peripheral neuropathy of the tips of the fingers and toes. She does believe that the neuropathy of the fingers is not constant but comes and goes. Even in the foot her symptoms come and go.  REVIEW OF SYSTEMS:   Constitutional: Denies fevers, chills or abnormal weight loss Eyes: Denies blurriness of vision Ears, nose, mouth, throat, and face: Denies mucositis or sore throat Respiratory: Denies cough, dyspnea or wheezes Cardiovascular: Denies palpitation, chest discomfort or lower extremity swelling Gastrointestinal:  Denies nausea, heartburn or change in bowel habits Skin: Denies abnormal skin rashes Lymphatics: Denies new  lymphadenopathy or easy bruising Neurological: Numbness in the fingers and toes Behavioral/Psych: Mood is stable, no new changes   All other systems were reviewed with the patient and are negative.  I have reviewed the past medical history, past surgical history, social history and family history with the patient and they are unchanged from previous note.  ALLERGIES:  has No Known Allergies.  MEDICATIONS:  Current Outpatient Prescriptions  Medication Sig Dispense Refill  . amphetamine-dextroamphetamine (ADDERALL) 30 MG tablet Take 1 tablet by mouth daily. 30 tablet 0  . citalopram (CELEXA) 40 MG tablet TAKE 1 TABLET (40 MG TOTAL) BY MOUTH DAILY. 90 tablet 3  . dexamethasone (DECADRON) 4 MG tablet Take 1 tablets by mouth once a day on the day after chemotherapy and then take 1 tablets two times a day for 2 days. Take with food. 30 tablet 1  . hydrOXYzine (ATARAX/VISTARIL) 50 MG tablet Take 50 mg by mouth 3 (three) times daily as needed (for sleep).    Marland Kitchen lidocaine-prilocaine (EMLA) cream Apply 1 application topically as needed. 30 g 1  . LORazepam (ATIVAN) 0.5 MG tablet Take 1 tablet (0.5 mg total) by mouth every 8 (eight) hours as needed for anxiety (or nausea). 30 tablet 0  . meloxicam (MOBIC) 7.5 MG tablet Take 7.5 mg by mouth daily.  0  . naproxen (NAPROSYN) 500 MG tablet Take 1 tablet (500 mg total) by mouth 2 (two) times daily with a meal. 60 tablet 1  . oxyCODONE-acetaminophen (PERCOCET/ROXICET) 5-325 MG tablet Take 1 tablet by mouth every 4 (four) hours as needed  for moderate pain. 30 tablet 0  . prochlorperazine (COMPAZINE) 10 MG tablet Take 1 tablet (10 mg total) by mouth every 6 (six) hours as needed. 30 tablet 3   No current facility-administered medications for this visit.    PHYSICAL EXAMINATION: ECOG PERFORMANCE STATUS: 1 - Symptomatic but completely ambulatory  There were no vitals filed for this visit. There were no vitals filed for this visit.  GENERAL:alert, no  distress and comfortable SKIN: skin color, texture, turgor are normal, no rashes or significant lesions EYES: normal, Conjunctiva are pink and non-injected, sclera clear OROPHARYNX:no exudate, no erythema and lips, buccal mucosa, and tongue normal  NECK: supple, thyroid normal size, non-tender, without nodularity LYMPH:  no palpable lymphadenopathy in the cervical, axillary or inguinal LUNGS: clear to auscultation and percussion with normal breathing effort HEART: regular rate & rhythm and no murmurs and no lower extremity edema ABDOMEN:abdomen soft, non-tender and normal bowel sounds Musculoskeletal:no cyanosis of digits and no clubbing  NEURO: alert & oriented x 3 with fluent speech, neuropathy grade 1  LABORATORY DATA:  I have reviewed the data as listed   Chemistry      Component Value Date/Time   NA 144 11/11/2014 1030   NA 136 06/18/2014 0400   K 3.8 11/11/2014 1030   K 3.6 06/18/2014 0400   CL 103 06/18/2014 0400   CO2 24 11/11/2014 1030   CO2 23 06/18/2014 0400   BUN 9.5 11/11/2014 1030   BUN 6 06/18/2014 0400   CREATININE 0.7 11/11/2014 1030   CREATININE 0.76 06/18/2014 0400      Component Value Date/Time   CALCIUM 9.5 11/11/2014 1030   CALCIUM 8.1* 06/18/2014 0400   ALKPHOS 76 11/11/2014 1030   ALKPHOS 66 02/10/2014 1555   AST 29 11/11/2014 1030   AST 16 02/10/2014 1555   ALT 30 11/11/2014 1030   ALT 13 02/10/2014 1555   BILITOT 0.31 11/11/2014 1030   BILITOT 0.3 02/10/2014 1555       Lab Results  Component Value Date   WBC 3.9 11/11/2014   HGB 11.1* 11/11/2014   HCT 35.6 11/11/2014   MCV 90.1 11/11/2014   PLT 322 11/11/2014   NEUTROABS 3.0 11/11/2014   ASSESSMENT & PLAN:  Breast cancer of upper-outer quadrant of right female breast Right breast mastectomy 06/17/2014: IDC, 3.6 cm, grade 3, 0/4 lymph nodes, T2 N0 M0 stage II a, ER 0%, PR 0%, HER-2 negative, Ki-67 90%  Treatment Plan: 1. Adjuvant chemo with dose dense Adriamycin and Cytoxan followed  by Abraxane weekly x 12 2. No role of radiation or antiestrogen therapy afterwards because she is ER/PR negative and she had mastectomy previously.  Current treatment: Completed 4 cycles of Adriamycin and Cytoxan dose dense, today is Cycle 10/12 of Abraxane Abraxane Toxicities: 1. Fatigue 2. Lower extremity myalgias: She reports that Mobic has not been very helpful and she takes Aleve twice a day. 3. Leukopenia related chemotherapy being monitored 4. Elevation of AST: Also being monitored 5. 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  I sent a message to Dr.Tsui to remove her port after chemotherapy is complete.   No orders of the defined types were placed in this encounter.   The patient has a good understanding of  the overall plan. she agrees with it. she will call with any problems that may develop before the next visit here.   Rulon Eisenmenger, MD 11/18/2014

## 2014-11-19 ENCOUNTER — Telehealth: Payer: Self-pay | Admitting: *Deleted

## 2014-11-19 DIAGNOSIS — C50411 Malignant neoplasm of upper-outer quadrant of right female breast: Secondary | ICD-10-CM

## 2014-11-19 MED ORDER — PROCHLORPERAZINE MALEATE 10 MG PO TABS: 10.0000 mg | ORAL_TABLET | Freq: Four times a day (QID) | ORAL | Status: DC | PRN

## 2014-11-19 NOTE — Telephone Encounter (Signed)
Called pt to inform she may have a flu shot per Dr. Lindi Adie. Sent refill for compazine to [facility]. Denies further needs relate doing well and without complaints.

## 2014-11-25 ENCOUNTER — Ambulatory Visit (HOSPITAL_BASED_OUTPATIENT_CLINIC_OR_DEPARTMENT_OTHER): Payer: BC Managed Care – PPO

## 2014-11-25 ENCOUNTER — Other Ambulatory Visit (HOSPITAL_BASED_OUTPATIENT_CLINIC_OR_DEPARTMENT_OTHER): Payer: BC Managed Care – PPO

## 2014-11-25 VITALS — BP 112/75 | HR 87 | Temp 98.0°F | Resp 18

## 2014-11-25 DIAGNOSIS — Z5111 Encounter for antineoplastic chemotherapy: Secondary | ICD-10-CM

## 2014-11-25 DIAGNOSIS — C50411 Malignant neoplasm of upper-outer quadrant of right female breast: Secondary | ICD-10-CM

## 2014-11-25 LAB — CBC WITH DIFFERENTIAL/PLATELET
BASO%: 0.3 % (ref 0.0–2.0)
BASOS ABS: 0 10*3/uL (ref 0.0–0.1)
EOS%: 1.6 % (ref 0.0–7.0)
Eosinophils Absolute: 0.1 10*3/uL (ref 0.0–0.5)
HEMATOCRIT: 33.8 % — AB (ref 34.8–46.6)
HGB: 10.7 g/dL — ABNORMAL LOW (ref 11.6–15.9)
LYMPH#: 0.4 10*3/uL — AB (ref 0.9–3.3)
LYMPH%: 14.1 % (ref 14.0–49.7)
MCH: 28.9 pg (ref 25.1–34.0)
MCHC: 31.7 g/dL (ref 31.5–36.0)
MCV: 91.4 fL (ref 79.5–101.0)
MONO#: 0.5 10*3/uL (ref 0.1–0.9)
MONO%: 16.9 % — ABNORMAL HIGH (ref 0.0–14.0)
NEUT#: 2.1 10*3/uL (ref 1.5–6.5)
NEUT%: 67.1 % (ref 38.4–76.8)
PLATELETS: 268 10*3/uL (ref 145–400)
RBC: 3.7 10*6/uL (ref 3.70–5.45)
RDW: 17.3 % — ABNORMAL HIGH (ref 11.2–14.5)
WBC: 3.1 10*3/uL — ABNORMAL LOW (ref 3.9–10.3)

## 2014-11-25 LAB — COMPREHENSIVE METABOLIC PANEL (CC13)
ALT: 23 U/L (ref 0–55)
ANION GAP: 10 meq/L (ref 3–11)
AST: 21 U/L (ref 5–34)
Albumin: 3.7 g/dL (ref 3.5–5.0)
Alkaline Phosphatase: 69 U/L (ref 40–150)
BUN: 11.8 mg/dL (ref 7.0–26.0)
CALCIUM: 9.2 mg/dL (ref 8.4–10.4)
CHLORIDE: 107 meq/L (ref 98–109)
CO2: 24 mEq/L (ref 22–29)
Creatinine: 0.7 mg/dL (ref 0.6–1.1)
EGFR: >90 mL/min/{1.73_m2} (ref >90)
Glucose: 109 mg/dl (ref 70–140)
POTASSIUM: 3.8 meq/L (ref 3.5–5.1)
Sodium: 141 mEq/L (ref 136–145)
Total Bilirubin: 0.33 mg/dL (ref 0.20–1.20)
Total Protein: 6.8 g/dL (ref 6.4–8.3)

## 2014-11-25 MED ORDER — SODIUM CHLORIDE 0.9 % IJ SOLN
10.0000 mL | INTRAMUSCULAR | Status: DC | PRN
  Administered 2014-11-25: 10 mL
  Filled 2014-11-25: qty 10

## 2014-11-25 MED ORDER — SODIUM CHLORIDE 0.9 % IV SOLN
Freq: Once | INTRAVENOUS | Status: AC
  Administered 2014-11-25: 11:00:00 via INTRAVENOUS

## 2014-11-25 MED ORDER — HEPARIN SOD (PORK) LOCK FLUSH 100 UNIT/ML IV SOLN
500.0000 [IU] | Freq: Once | INTRAVENOUS | Status: AC | PRN
  Administered 2014-11-25: 500 [IU]
  Filled 2014-11-25: qty 5

## 2014-11-25 MED ORDER — PACLITAXEL PROTEIN-BOUND CHEMO INJECTION 100 MG
80.0000 mg/m2 | Freq: Once | INTRAVENOUS | Status: AC
  Administered 2014-11-25: 150 mg via INTRAVENOUS
  Filled 2014-11-25: qty 30

## 2014-11-25 MED ORDER — PALONOSETRON HCL INJECTION 0.25 MG/5ML
0.2500 mg | Freq: Once | INTRAVENOUS | Status: AC
  Administered 2014-11-25: 0.25 mg via INTRAVENOUS

## 2014-11-25 MED ORDER — PALONOSETRON HCL INJECTION 0.25 MG/5ML
INTRAVENOUS | Status: AC
  Filled 2014-11-25: qty 5

## 2014-11-25 NOTE — Patient Instructions (Signed)
[facility] Discharge Instructions for Patients Receiving Chemotherapy  Today you received the following chemotherapy agents: Abraxane   To help prevent nausea and vomiting after your treatment, we encourage you to take your nausea medication as directed.    If you develop nausea and vomiting that is not controlled by your nausea medication, call the clinic.   BELOW ARE SYMPTOMS THAT SHOULD BE REPORTED IMMEDIATELY:  *FEVER GREATER THAN 100.5 F  *CHILLS WITH OR WITHOUT FEVER  NAUSEA AND VOMITING THAT IS NOT CONTROLLED WITH YOUR NAUSEA MEDICATION  *UNUSUAL SHORTNESS OF BREATH  *UNUSUAL BRUISING OR BLEEDING  TENDERNESS IN MOUTH AND THROAT WITH OR WITHOUT PRESENCE OF ULCERS  *URINARY PROBLEMS  *BOWEL PROBLEMS  UNUSUAL RASH Items with * indicate a potential emergency and should be followed up as soon as possible.  Feel free to call the clinic you have any questions or concerns. The clinic phone number is [phone].  Please show the CHEMO ALERT CARD at check-in to the Emergency Department and triage nurse.   

## 2014-12-01 NOTE — Assessment & Plan Note (Signed)
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 Adjuvant chemo with dose dense Adriamycin and Cytoxan followed by Abraxane weekly x 12 2. No role of radiation or antiestrogen therapy afterwards because she is ER/PR negative and she had mastectomy previously.  Current treatment: Completed 4 cycles of Adriamycin and Cytoxan dose dense, today is Cycle 12/12 of Abraxane Abraxane Toxicities: 1. Fatigue 2. Lower extremity myalgias: She reports that Mobic has not been very helpful and she takes Aleve twice a day. 3. Leukopenia related chemotherapy being monitored 4. Elevation of AST: Also being monitored 5. 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

## 2014-12-02 ENCOUNTER — Other Ambulatory Visit (HOSPITAL_BASED_OUTPATIENT_CLINIC_OR_DEPARTMENT_OTHER): Payer: BC Managed Care – PPO

## 2014-12-02 ENCOUNTER — Encounter: Payer: Self-pay | Admitting: Hematology and Oncology

## 2014-12-02 ENCOUNTER — Ambulatory Visit (HOSPITAL_BASED_OUTPATIENT_CLINIC_OR_DEPARTMENT_OTHER): Payer: BC Managed Care – PPO

## 2014-12-02 ENCOUNTER — Encounter: Payer: Self-pay | Admitting: *Deleted

## 2014-12-02 ENCOUNTER — Ambulatory Visit (HOSPITAL_BASED_OUTPATIENT_CLINIC_OR_DEPARTMENT_OTHER): Payer: BC Managed Care – PPO | Admitting: Hematology and Oncology

## 2014-12-02 ENCOUNTER — Telehealth: Payer: Self-pay | Admitting: Hematology and Oncology

## 2014-12-02 VITALS — BP 125/76 | HR 105 | Temp 98.5°F | Resp 18 | Ht 63.0 in | Wt 188.4 lb

## 2014-12-02 DIAGNOSIS — D701 Agranulocytosis secondary to cancer chemotherapy: Secondary | ICD-10-CM | POA: Diagnosis not present

## 2014-12-02 DIAGNOSIS — C50411 Malignant neoplasm of upper-outer quadrant of right female breast: Secondary | ICD-10-CM | POA: Diagnosis not present

## 2014-12-02 DIAGNOSIS — Z23 Encounter for immunization: Secondary | ICD-10-CM

## 2014-12-02 DIAGNOSIS — G620 Drug-induced polyneuropathy: Secondary | ICD-10-CM

## 2014-12-02 DIAGNOSIS — R530 Neoplastic (malignant) related fatigue: Secondary | ICD-10-CM | POA: Diagnosis not present

## 2014-12-02 DIAGNOSIS — Z5111 Encounter for antineoplastic chemotherapy: Secondary | ICD-10-CM | POA: Diagnosis not present

## 2014-12-02 DIAGNOSIS — F329 Major depressive disorder, single episode, unspecified: Secondary | ICD-10-CM

## 2014-12-02 DIAGNOSIS — R748 Abnormal levels of other serum enzymes: Secondary | ICD-10-CM

## 2014-12-02 LAB — CBC WITH DIFFERENTIAL/PLATELET
BASO%: 0.5 % (ref 0.0–2.0)
BASOS ABS: 0 10*3/uL (ref 0.0–0.1)
EOS%: 1 % (ref 0.0–7.0)
Eosinophils Absolute: 0 10*3/uL (ref 0.0–0.5)
HEMATOCRIT: 34.8 % (ref 34.8–46.6)
HGB: 10.9 g/dL — ABNORMAL LOW (ref 11.6–15.9)
LYMPH#: 0.6 10*3/uL — AB (ref 0.9–3.3)
LYMPH%: 13.8 % — AB (ref 14.0–49.7)
MCH: 28.7 pg (ref 25.1–34.0)
MCHC: 31.3 g/dL — AB (ref 31.5–36.0)
MCV: 91.6 fL (ref 79.5–101.0)
MONO#: 0.5 10*3/uL (ref 0.1–0.9)
MONO%: 12.1 % (ref 0.0–14.0)
NEUT#: 3 10*3/uL (ref 1.5–6.5)
NEUT%: 72.6 % (ref 38.4–76.8)
PLATELETS: 302 10*3/uL (ref 145–400)
RBC: 3.8 10*6/uL (ref 3.70–5.45)
RDW: 16.9 % — ABNORMAL HIGH (ref 11.2–14.5)
WBC: 4.1 10*3/uL (ref 3.9–10.3)

## 2014-12-02 LAB — COMPREHENSIVE METABOLIC PANEL (CC13)
ALK PHOS: 74 U/L (ref 40–150)
ALT: 24 U/L (ref 0–55)
AST: 23 U/L (ref 5–34)
Albumin: 3.8 g/dL (ref 3.5–5.0)
Anion Gap: 9 mEq/L (ref 3–11)
BUN: 7.3 mg/dL (ref 7.0–26.0)
CALCIUM: 9.3 mg/dL (ref 8.4–10.4)
CO2: 27 mEq/L (ref 22–29)
CREATININE: 0.7 mg/dL (ref 0.6–1.1)
Chloride: 106 mEq/L (ref 98–109)
EGFR: >90 mL/min/{1.73_m2} (ref >90)
Glucose: 106 mg/dl (ref 70–140)
Potassium: 3.6 mEq/L (ref 3.5–5.1)
Sodium: 142 mEq/L (ref 136–145)
Total Protein: 6.9 g/dL (ref 6.4–8.3)

## 2014-12-02 MED ORDER — SODIUM CHLORIDE 0.9 % IV SOLN
Freq: Once | INTRAVENOUS | Status: AC
  Administered 2014-12-02: 10:00:00 via INTRAVENOUS

## 2014-12-02 MED ORDER — PALONOSETRON HCL INJECTION 0.25 MG/5ML
INTRAVENOUS | Status: AC
  Filled 2014-12-02: qty 5

## 2014-12-02 MED ORDER — PACLITAXEL PROTEIN-BOUND CHEMO INJECTION 100 MG
80.0000 mg/m2 | Freq: Once | INTRAVENOUS | Status: AC
  Administered 2014-12-02: 150 mg via INTRAVENOUS
  Filled 2014-12-02: qty 30

## 2014-12-02 MED ORDER — INFLUENZA VAC SPLIT QUAD 0.5 ML IM SUSY
0.5000 mL | PREFILLED_SYRINGE | Freq: Once | INTRAMUSCULAR | Status: AC
  Administered 2014-12-02: 0.5 mL via INTRAMUSCULAR
  Filled 2014-12-02: qty 0.5

## 2014-12-02 MED ORDER — PALONOSETRON HCL INJECTION 0.25 MG/5ML
0.2500 mg | Freq: Once | INTRAVENOUS | Status: AC
  Administered 2014-12-02: 0.25 mg via INTRAVENOUS

## 2014-12-02 MED ORDER — SODIUM CHLORIDE 0.9 % IJ SOLN
10.0000 mL | INTRAMUSCULAR | Status: DC | PRN
  Administered 2014-12-02: 10 mL
  Filled 2014-12-02: qty 10

## 2014-12-02 MED ORDER — OXYCODONE-ACETAMINOPHEN 5-325 MG PO TABS: 1.0000 | ORAL_TABLET | ORAL | Status: DC | PRN

## 2014-12-02 MED ORDER — HEPARIN SOD (PORK) LOCK FLUSH 100 UNIT/ML IV SOLN
500.0000 [IU] | Freq: Once | INTRAVENOUS | Status: AC | PRN
  Administered 2014-12-02: 500 [IU]
  Filled 2014-12-02: qty 5

## 2014-12-02 NOTE — Addendum Note (Signed)
Addended by: Jonelle Sports K on: 12/02/2014 10:00 AM   Modules accepted: Orders, Medications

## 2014-12-02 NOTE — Telephone Encounter (Signed)
Appointments made and avs printed for patient °

## 2014-12-02 NOTE — Patient Instructions (Signed)
[facility] Discharge Instructions for Patients Receiving Chemotherapy  Today you received the following chemotherapy agents Abraxane To help prevent nausea and vomiting after your treatment, we encourage you to take your nausea medication as prescribed.   If you develop nausea and vomiting that is not controlled by your nausea medication, call the clinic.   BELOW ARE SYMPTOMS THAT SHOULD BE REPORTED IMMEDIATELY:  *FEVER GREATER THAN 100.5 F  *CHILLS WITH OR WITHOUT FEVER  NAUSEA AND VOMITING THAT IS NOT CONTROLLED WITH YOUR NAUSEA MEDICATION  *UNUSUAL SHORTNESS OF BREATH  *UNUSUAL BRUISING OR BLEEDING  TENDERNESS IN MOUTH AND THROAT WITH OR WITHOUT PRESENCE OF ULCERS  *URINARY PROBLEMS  *BOWEL PROBLEMS  UNUSUAL RASH Items with * indicate a potential emergency and should be followed up as soon as possible.  Feel free to call the clinic you have any questions or concerns. The clinic phone number is [phone].  Please show the CHEMO ALERT CARD at check-in to the Emergency Department and triage nurse.   

## 2014-12-02 NOTE — Progress Notes (Signed)
Patient Care Team: Dorena Cookey, MD as PCP - General  DIAGNOSIS: Breast cancer of upper-outer quadrant of right female breast [facility])   Staging form: Breast, AJCC 7th Edition     Clinical: Stage IIA (T2, N0, M0) - Unsigned   SUMMARY OF ONCOLOGIC HISTORY:   Breast cancer of upper-outer quadrant of right female breast ([place])   05/15/2014 Initial Biopsy Right breast biopsy 10:00: Invasive ductal carcinoma grade 3, ER 0% PR 0% HER-2 negative, Ki-67 90%   05/20/2014 Breast MRI Right breast mass: 2.7 x 2.7 x 3.3 cm along with 3 cm non-mass enhancement, upper inner quadrant 8 mm mass, lower inner quadrant 6 mm mass, right axillary lymph nodes 1.2 cm, T2 bright liver lesions   06/17/2014 Surgery Right breast mastectomy: IDC, 3.6 cm, grade 3, 0/4 lymph nodes, T2 N0 M0 stage II a, ER 0%, PR 0%, HER-2 negative, Ki-67 90%   07/22/2014 - 12/02/2014 Chemotherapy Adjuvant chemotherapy with dose dense Adriamycin and Cytoxan 4 followed by Abraxane weekly 12    CHIEF COMPLIANT: Cycle 12 Abraxane  INTERVAL HISTORY: Misty Snyder is a 54 year old with above-mentioned history of right breast cancer who underwent mastectomy and is currently on adjuvant chemotherapy. Today is her last cycle of chemotherapy with Abraxane. 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  REVIEW OF SYSTEMS:   Constitutional: Denies fevers, chills or abnormal weight loss Eyes: Denies blurriness of vision Ears, nose, mouth, throat, and face: Denies mucositis or sore throat Respiratory: Denies cough, dyspnea or wheezes Cardiovascular: Denies palpitation, chest discomfort or lower extremity swelling Gastrointestinal:  Denies nausea, heartburn or change in bowel habits Skin: Denies abnormal skin rashes Lymphatics: Denies new lymphadenopathy or easy bruising Neurological: Neuropathy in the extremities Behavioral/Psych: Mood is stable, no new changes  All other systems were  reviewed with the patient and are negative.  I have reviewed the past medical history, past surgical history, social history and family history with the patient and they are unchanged from previous note.  ALLERGIES:  has No Known Allergies.  MEDICATIONS:  Current Outpatient Prescriptions  Medication Sig Dispense Refill  . amphetamine-dextroamphetamine (ADDERALL) 30 MG tablet Take 1 tablet by mouth daily. 30 tablet 0  . citalopram (CELEXA) 40 MG tablet TAKE 1 TABLET (40 MG TOTAL) BY MOUTH DAILY. 90 tablet 3  . dexamethasone (DECADRON) 4 MG tablet Take 1 tablets by mouth once a day on the day after chemotherapy and then take 1 tablets two times a day for 2 days. Take with food. 30 tablet 1  . hydrOXYzine (ATARAX/VISTARIL) 50 MG tablet Take 50 mg by mouth 3 (three) times daily as needed (for sleep).    Marland Kitchen lidocaine-prilocaine (EMLA) cream Apply 1 application topically as needed. 30 g 1  . LORazepam (ATIVAN) 0.5 MG tablet Take 1 tablet (0.5 mg total) by mouth every 8 (eight) hours as needed for anxiety (or nausea). 30 tablet 0  . meloxicam (MOBIC) 7.5 MG tablet Take 7.5 mg by mouth daily.  0  . naproxen (NAPROSYN) 500 MG tablet Take 1 tablet (500 mg total) by mouth 2 (two) times daily with a meal. 60 tablet 1  . oxyCODONE-acetaminophen (PERCOCET/ROXICET) 5-325 MG tablet Take 1 tablet by mouth every 4 (four) hours as needed for moderate pain. 30 tablet 0  . prochlorperazine (COMPAZINE) 10 MG tablet Take 1 tablet (10 mg total) by mouth every 6 (six) hours as needed. 30 tablet 3   No current facility-administered medications for this  visit.    PHYSICAL EXAMINATION: ECOG PERFORMANCE STATUS: 1 - Symptomatic but completely ambulatory  Filed Vitals:   12/02/14 0815  BP: 125/76  Pulse: 105  Temp: 98.5 F (36.9 C)  Resp: 18   Filed Weights   12/02/14 0815  Weight: 188 lb 6.4 oz (85.458 kg)    GENERAL:alert, no distress and comfortable SKIN: skin color, texture, turgor are normal, no rashes  or significant lesions EYES: normal, Conjunctiva are pink and non-injected, sclera clear OROPHARYNX:no exudate, no erythema and lips, buccal mucosa, and tongue normal  NECK: supple, thyroid normal size, non-tender, without nodularity LYMPH:  no palpable lymphadenopathy in the cervical, axillary or inguinal LUNGS: clear to auscultation and percussion with normal breathing effort HEART: regular rate & rhythm and no murmurs and no lower extremity edema ABDOMEN:abdomen soft, non-tender and normal bowel sounds Musculoskeletal:no cyanosis of digits and no clubbing  NEURO: alert & oriented x 3 with fluent speech, neuropathy grade 1-2  LABORATORY DATA:  I have reviewed the data as listed   Chemistry      Component Value Date/Time   NA 141 11/25/2014 1027   NA 136 06/18/2014 0400   K 3.8 11/25/2014 1027   K 3.6 06/18/2014 0400   CL 103 06/18/2014 0400   CO2 24 11/25/2014 1027   CO2 23 06/18/2014 0400   BUN 11.8 11/25/2014 1027   BUN 6 06/18/2014 0400   CREATININE 0.7 11/25/2014 1027   CREATININE 0.76 06/18/2014 0400      Component Value Date/Time   CALCIUM 9.2 11/25/2014 1027   CALCIUM 8.1* 06/18/2014 0400   ALKPHOS 69 11/25/2014 1027   ALKPHOS 66 02/10/2014 1555   AST 21 11/25/2014 1027   AST 16 02/10/2014 1555   ALT 23 11/25/2014 1027   ALT 13 02/10/2014 1555   BILITOT 0.33 11/25/2014 1027   BILITOT 0.3 02/10/2014 1555       Lab Results  Component Value Date   WBC 4.1 12/02/2014   HGB 10.9* 12/02/2014   HCT 34.8 12/02/2014   MCV 91.6 12/02/2014   PLT 302 12/02/2014   NEUTROABS 3.0 12/02/2014   ASSESSMENT & PLAN:  Breast cancer of upper-outer quadrant of right female breast Right breast mastectomy 06/17/2014: IDC, 3.6 cm, grade 3, 0/4 lymph nodes, T2 N0 M0 stage II a, ER 0%, PR 0%, HER-2 negative, Ki-67 90%  Treatment Plan: 1. Adjuvant chemo with dose dense Adriamycin and Cytoxan followed by Abraxane weekly x 12 2. No role of radiation or antiestrogen therapy  afterwards because she is ER/PR negative and she had mastectomy previously.  Current treatment: Completed 4 cycles of Adriamycin and Cytoxan dose dense, today is Cycle 12/12 of Abraxane Abraxane Toxicities: 1. Fatigue 2. Lower extremity myalgias: She reports that Mobic has not been very helpful and she takes Aleve twice a day. 3. Leukopenia related chemotherapy being monitored 4. Elevation of AST: Also being monitored 5. Peripheral neuropathy grade 1: Being observed  Monitoring closely for toxicities Patient will get flu shot today I renewed her prescription for Percocet (we hope she will not require any further pain medications after this) she takes this for muscle aches from chemotherapy  Survivorship: Will refer her to survivorship clinic for survivorship care plan. I provided her paperwork for [facility] program Depression: Patient is taking Celexa. 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  Orders Placed This Encounter  Procedures  . Amb Referral to Survivorship Program    Referral Priority:  Routine    Referral Type:  Consultation    Number of Visits Requested:  1   The patient has a good understanding of the overall plan. she agrees with it. she will call with any problems that may develop before the next visit here.   Rulon Eisenmenger, MD 12/02/2014

## 2014-12-03 ENCOUNTER — Ambulatory Visit: Payer: Self-pay | Admitting: Surgery

## 2014-12-24 ENCOUNTER — Encounter: Payer: BC Managed Care – PPO | Admitting: Nurse Practitioner

## 2014-12-28 ENCOUNTER — Telehealth: Payer: Self-pay | Admitting: *Deleted

## 2014-12-28 NOTE — Telephone Encounter (Signed)
LM for pt to rtn call to reschedule survivorship appt.

## 2014-12-31 ENCOUNTER — Encounter: Payer: Self-pay | Admitting: Nurse Practitioner

## 2014-12-31 DIAGNOSIS — C50411 Malignant neoplasm of upper-outer quadrant of right female breast: Secondary | ICD-10-CM

## 2014-12-31 NOTE — Progress Notes (Signed)
The Survivorship Care Plan was mailed to Ms. Logan as she was unable to come in to the Survivorship Clinic for an in-person visit at this time. A letter was mailed to her outlining the purpose of the content of the care plan, as well as encouraging her to reach out to me with any questions or concerns.  My business card was included in the correspondence to the patient as well.  A copy of the care plan was also routed/faxed/mailed to TODD,JEFFREY Zenia Resides, MD, the patient's PCP.  I will not be placing any follow-up appointments to the Survivorship Clinic for Ms. Colford, but I am happy to see her at any time in the future for any survivorship concerns that may arise. Thank you for allowing me to participate in her care!  Kenn File, [place] [phone]

## 2015-01-04 ENCOUNTER — Encounter: Payer: Self-pay | Admitting: *Deleted

## 2015-01-06 ENCOUNTER — Encounter: Payer: BC Managed Care – PPO | Admitting: Nurse Practitioner

## 2015-02-05 ENCOUNTER — Telehealth: Payer: Self-pay | Admitting: *Deleted

## 2015-02-05 NOTE — Telephone Encounter (Signed)
Pt called c/u left "underarm tenderness". This is very concerning to her since this is how she found her right breast cancer. Pt relate she does not feel a lump, but that she didn't feel one on the right known breast cancer either.  Scheduled appt for pt to see Dr. Lindi Adie on 1/23 to assess area and need for imaging. Confirmed appt date and time with pt

## 2015-02-08 ENCOUNTER — Telehealth: Payer: Self-pay | Admitting: Hematology and Oncology

## 2015-02-08 ENCOUNTER — Other Ambulatory Visit: Payer: Self-pay | Admitting: Hematology and Oncology

## 2015-02-08 ENCOUNTER — Encounter: Payer: Self-pay | Admitting: Hematology and Oncology

## 2015-02-08 ENCOUNTER — Ambulatory Visit (HOSPITAL_BASED_OUTPATIENT_CLINIC_OR_DEPARTMENT_OTHER): Payer: BC Managed Care – PPO | Admitting: Hematology and Oncology

## 2015-02-08 VITALS — BP 105/61 | HR 99 | Temp 97.5°F | Resp 17 | Ht 63.0 in | Wt 183.9 lb

## 2015-02-08 DIAGNOSIS — G629 Polyneuropathy, unspecified: Secondary | ICD-10-CM | POA: Diagnosis not present

## 2015-02-08 DIAGNOSIS — G620 Drug-induced polyneuropathy: Secondary | ICD-10-CM

## 2015-02-08 DIAGNOSIS — C50411 Malignant neoplasm of upper-outer quadrant of right female breast: Secondary | ICD-10-CM | POA: Diagnosis not present

## 2015-02-08 DIAGNOSIS — T451X5A Adverse effect of antineoplastic and immunosuppressive drugs, initial encounter: Secondary | ICD-10-CM

## 2015-02-08 DIAGNOSIS — N644 Mastodynia: Secondary | ICD-10-CM

## 2015-02-08 MED ORDER — NAPROXEN 500 MG PO TABS: 500.0000 mg | ORAL_TABLET | Freq: Two times a day (BID) | ORAL | Status: DC

## 2015-02-08 NOTE — Progress Notes (Signed)
Patient Care Team: Dorena Cookey, MD as PCP - General Nicholas Lose, MD as Consulting Physician (Hematology and Oncology) Donnie Mesa, MD as Consulting Physician (General Surgery) Sylvan Cheese, NP as Nurse Practitioner (Hematology and Oncology)  DIAGNOSIS: Breast cancer of upper-outer quadrant of right female breast [facility])   Staging form: Breast, AJCC 7th Edition     Clinical: Stage IIA (T2, N0, M0) - Unsigned     Pathologic stage from 06/17/2014: Stage IIA (T2, N0, cM0) - Unsigned   SUMMARY OF ONCOLOGIC HISTORY:   Breast cancer of upper-outer quadrant of right female breast ([place])   05/15/2014 Mammogram Right breast: 2.5 cm mass noted at 10:00   05/15/2014 Breast US Right breast: irregular hypoechoic mass at 10 o'clock,8 cm from the nipple measuring 2.6 x 1.4 x 2 cm. Right axillary LN with a mildly thickened cortex is measures 1.3 x 0.9 x 1 cm.   05/15/2014 Initial Biopsy Right breast biopsy 10:00: Invasive ductal carcinoma grade 3, ER 0% PR 0% HER-2 negative, Ki-67 90%   05/20/2014 Breast MRI Right breast mass: 2.7 x 2.7 x 3.3 cm along with 3 cm non-mass enhancement, upper inner quadrant 8 mm mass, lower inner quadrant 6 mm mass, right axillary lymph nodes 1.2 cm, T2 bright liver lesions   06/02/2014 Imaging Abdominal MR: Multiple benign hepatic cysts.   06/02/2014 Clinical Stage Stage IIA: T2 N0   06/03/2014 Procedure Genetic testing: BreastNext panel Cephus [facility]ing) reveals no clinically significant variant at ATM, BARD1, BRCA1, BRCA2, BRIP1, CDH1, CHEK2, MRE11A, MUTYH, NBN, NF1, PALB2, PTEN, RAD50, RAD51C, RAD51D, and TP53   06/17/2014 Definitive Surgery Right breast mastectomy: IDC, 3.6 cm, grade 3, 0/4 lymph nodes,  ER 0%, PR 0%, HER-2 negative, Ki-67 90%   06/17/2014 Pathologic Stage Stage IIA: pT2 pN0   07/22/2014 - 12/02/2014 Adjuvant Chemotherapy Adjuvant chemotherapy with dose dense Adriamycin and Cytoxan 4 followed by Abraxane weekly 12   12/31/2014 Survivorship Survivorship care plan  completed and mailed to patient in lieu of in person visit.    CHIEF COMPLIANT:  Left breast pain  INTERVAL HISTORY: Misty Snyder is a  55 year old lady with above-mentioned history of triple negative breast cancer underwent  Right mastectomy followed by adjuvant chemotherapy and is currently on surveillance. She came in today complaining of new onset of left breast pain started a few days ago. It actually got better and she does not have any pain today. She is very worried that this would be a sign of recurrence of breast cancer because right breast cancer started with pain as well. She may have been moving heavy objects especially boxes as she is trying to downsize her house.  REVIEW OF SYSTEMS:   Constitutional: Denies fevers, chills or abnormal weight loss Eyes: Denies blurriness of vision Ears, nose, mouth, throat, and face: Denies mucositis or sore throat Respiratory: Denies cough, dyspnea or wheezes Cardiovascular: Denies palpitation, chest discomfort Gastrointestinal:  Denies nausea, heartburn or change in bowel habits Skin: Denies abnormal skin rashes Lymphatics: Denies new lymphadenopathy or easy bruising Neurological:Denies numbness, tingling or new weaknesses Behavioral/Psych: Mood is stable, no new changes  Extremities: No lower extremity edema Breast:  denies any pain or lumps or nodules in either breasts All other systems were reviewed with the patient and are negative.  I have reviewed the past medical history, past surgical history, social history and family history with the patient and they are unchanged from previous note.  ALLERGIES:  has No Known Allergies.  MEDICATIONS:  Current Outpatient  Prescriptions  Medication Sig Dispense Refill  . amphetamine-dextroamphetamine (ADDERALL) 30 MG tablet Take 1 tablet by mouth daily. 30 tablet 0  . citalopram (CELEXA) 40 MG tablet TAKE 1 TABLET (40 MG TOTAL) BY MOUTH DAILY. 90 tablet 3  . dexamethasone (DECADRON) 4 MG  tablet Take 1 tablets by mouth once a day on the day after chemotherapy and then take 1 tablets two times a day for 2 days. Take with food. 30 tablet 1  . hydrOXYzine (ATARAX/VISTARIL) 50 MG tablet Take 50 mg by mouth 3 (three) times daily as needed (for sleep).    Marland Kitchen lidocaine-prilocaine (EMLA) cream Apply 1 application topically as needed. 30 g 1  . LORazepam (ATIVAN) 0.5 MG tablet Take 1 tablet (0.5 mg total) by mouth every 8 (eight) hours as needed for anxiety (or nausea). 30 tablet 0  . meloxicam (MOBIC) 7.5 MG tablet Take 7.5 mg by mouth daily.  0  . naproxen (NAPROSYN) 500 MG tablet Take 1 tablet (500 mg total) by mouth 2 (two) times daily with a meal. 60 tablet 1  . oxyCODONE-acetaminophen (PERCOCET/ROXICET) 5-325 MG tablet Take 1 tablet by mouth every 4 (four) hours as needed for moderate pain. 30 tablet 0  . prochlorperazine (COMPAZINE) 10 MG tablet Take 1 tablet (10 mg total) by mouth every 6 (six) hours as needed. 30 tablet 3   No current facility-administered medications for this visit.    PHYSICAL EXAMINATION: ECOG PERFORMANCE STATUS: 1 - Symptomatic but completely ambulatory  Filed Vitals:   02/08/15 1525  BP: 105/61  Pulse: 99  Temp: 97.5 F (36.4 C)  Resp: 17   Filed Weights   02/08/15 1525  Weight: 183 lb 14.4 oz (83.416 kg)    GENERAL:alert, no distress and comfortable SKIN: skin color, texture, turgor are normal, no rashes or significant lesions EYES: normal, Conjunctiva are pink and non-injected, sclera clear OROPHARYNX:no exudate, no erythema and lips, buccal mucosa, and tongue normal  NECK: supple, thyroid normal size, non-tender, without nodularity LYMPH:  no palpable lymphadenopathy in the cervical, axillary or inguinal LUNGS: clear to auscultation and percussion with normal breathing effort HEART: regular rate & rhythm and no murmurs and no lower extremity edema ABDOMEN:abdomen soft, non-tender and normal bowel sounds MUSCULOSKELETAL:no cyanosis of digits  and no clubbing  NEURO: alert & oriented x 3 with fluent speech, no focal motor/sensory deficits EXTREMITIES: No lower extremity edema Breast exam: no palpable lumps or nodules in the left breast or axilla. No palpable abnormalities in the right chest wall or axilla.  LABORATORY DATA:  I have reviewed the data as listed   Chemistry      Component Value Date/Time   NA 142 12/02/2014 0757   NA 136 06/18/2014 0400   K 3.6 12/02/2014 0757   K 3.6 06/18/2014 0400   CL 103 06/18/2014 0400   CO2 27 12/02/2014 0757   CO2 23 06/18/2014 0400   BUN 7.3 12/02/2014 0757   BUN 6 06/18/2014 0400   CREATININE 0.7 12/02/2014 0757   CREATININE 0.76 06/18/2014 0400      Component Value Date/Time   CALCIUM 9.3 12/02/2014 0757   CALCIUM 8.1* 06/18/2014 0400   ALKPHOS 74 12/02/2014 0757   ALKPHOS 66 02/10/2014 1555   AST 23 12/02/2014 0757   AST 16 02/10/2014 1555   ALT 24 12/02/2014 0757   ALT 13 02/10/2014 1555   BILITOT <0.30 12/02/2014 0757   BILITOT 0.3 02/10/2014 1555       Lab Results  Component Value  Date   WBC 4.1 12/02/2014   HGB 10.9* 12/02/2014   HCT 34.8 12/02/2014   MCV 91.6 12/02/2014   PLT 302 12/02/2014   NEUTROABS 3.0 12/02/2014     ASSESSMENT & PLAN:  Breast cancer of upper-outer quadrant of right female breast Right breast mastectomy 06/17/2014: IDC, 3.6 cm, grade 3, 0/4 lymph nodes, T2 N0 M0 stage II a, ER 0%, PR 0%, HER-2 negative, Ki-67 90% Adjuvant chemotherapy with dose dense Adriamycin and Cytoxan 4 followed by Abraxane weekly 12 from 07/22/2014 to 12/02/2014  Breast Cancer Surveillance: 1. Breast exam 02/08/2015: Normal  Breast exam on the left breast. Right chest wall scar without any abnormalities. 2. Mammogram :  Will be obtained in the next week  Left breast pain : she was very concerned about this pain and asked for an appointment to see me sooner. The pain actually went away and she feels much better now. There is no palpable abnormality at the  site of concern. Because her original cancer of the right breast presented as breast pain, she is very anxious and would like to investigate this further. I order for a mammogram evaluation.   Neuropathy in pain: patient was taking oxycodone. I recommended discontinuation of narcotic pain medication and substituting it with myositis and naproxen exercise and supportive care.  I discussed the the risks of narcotic addiction our long-term basis.  Return to clinic in 6 months for surveillance evaluation.   No orders of the defined types were placed in this encounter.   The patient has a good understanding of the overall plan. she agrees with it. she will call with any problems that may develop before the next visit here.   Rulon Eisenmenger, MD 02/08/2015

## 2015-02-08 NOTE — Telephone Encounter (Signed)
Gave patient avs report and appointments for July and also mammo/us 1/31 @ [facility].

## 2015-02-08 NOTE — Telephone Encounter (Signed)
Gave patient avs report and appointments for July.  °

## 2015-02-08 NOTE — Assessment & Plan Note (Signed)
Right breast mastectomy 06/17/2014: IDC, 3.6 cm, grade 3, 0/4 lymph nodes, T2 N0 M0 stage II a, ER 0%, PR 0%, HER-2 negative, Ki-67 90% Adjuvant chemotherapy with dose dense Adriamycin and Cytoxan 4 followed by Abraxane weekly 12 from 07/22/2014 to 12/02/2014  Breast Cancer Surveillance: 1. Breast exam 02/08/2015: Normal 2. Mammogram : Will be needed on the left breast annual  Survivorship: Discussed the importance of physical exercise in decreasing the likelihood of breast cancer recurrence. Recommended 30 mins daily 6 days a week of either brisk walking or cycling or swimming. Encouraged patient to eat more fruits and vegetables and decrease red meat.   Return to clinic in 6 months for surveillance evaluation.

## 2015-02-16 ENCOUNTER — Ambulatory Visit
Admission: RE | Admit: 2015-02-16 | Discharge: 2015-02-16 | Disposition: A | Discharge status: Home / Self Care | Payer: BC Managed Care – PPO | Source: Ambulatory Visit | Attending: Hematology and Oncology | Admitting: Hematology and Oncology

## 2015-02-16 ENCOUNTER — Other Ambulatory Visit: Payer: Self-pay | Admitting: Hematology and Oncology

## 2015-02-16 DIAGNOSIS — C50411 Malignant neoplasm of upper-outer quadrant of right female breast: Secondary | ICD-10-CM

## 2015-03-04 ENCOUNTER — Ambulatory Visit: Payer: BC Managed Care – PPO | Admitting: Hematology and Oncology

## 2015-03-04 ENCOUNTER — Other Ambulatory Visit: Payer: BC Managed Care – PPO

## 2015-04-20 ENCOUNTER — Encounter: Payer: Self-pay | Admitting: Hematology and Oncology

## 2015-04-20 NOTE — Progress Notes (Signed)
fmla forms faxed 08/2014 I sent to medical records

## 2015-04-29 ENCOUNTER — Ambulatory Visit (INDEPENDENT_AMBULATORY_CARE_PROVIDER_SITE_OTHER): Payer: BC Managed Care – PPO | Admitting: Family Medicine

## 2015-04-29 ENCOUNTER — Encounter: Payer: Self-pay | Admitting: Family Medicine

## 2015-04-29 VITALS — BP 130/80 | HR 85 | Temp 98.7°F | Resp 12 | Ht 63.0 in | Wt 172.0 lb

## 2015-04-29 DIAGNOSIS — R42 Dizziness and giddiness: Secondary | ICD-10-CM

## 2015-04-29 DIAGNOSIS — D649 Anemia, unspecified: Secondary | ICD-10-CM

## 2015-04-29 DIAGNOSIS — K5909 Other constipation: Secondary | ICD-10-CM

## 2015-04-29 DIAGNOSIS — K5900 Constipation, unspecified: Secondary | ICD-10-CM

## 2015-04-29 LAB — CBC WITH DIFFERENTIAL/PLATELET
BASOS ABS: 0 {cells}/uL (ref 0–200)
BASOS PCT: 0 %
EOS ABS: 49 {cells}/uL (ref 15–500)
Eosinophils Relative: 1 %
HCT: 38.5 % (ref 35.0–45.0)
HEMOGLOBIN: 12.4 g/dL (ref 11.7–15.5)
LYMPHS ABS: 833 {cells}/uL — AB (ref 850–3900)
Lymphocytes Relative: 17 %
MCH: 26 pg — ABNORMAL LOW (ref 27.0–33.0)
MCHC: 32.2 g/dL (ref 32.0–36.0)
MCV: 80.7 fL (ref 80.0–100.0)
MONO ABS: 539 {cells}/uL (ref 200–950)
MONOS PCT: 11 %
MPV: 10 fL (ref 7.5–12.5)
NEUTROS ABS: 3479 {cells}/uL (ref 1500–7800)
Neutrophils Relative %: 71 %
PLATELETS: 261 10*3/uL (ref 140–400)
RBC: 4.77 MIL/uL (ref 3.80–5.10)
RDW: 17.2 % — ABNORMAL HIGH (ref 11.0–15.0)
WBC: 4.9 10*3/uL (ref 3.8–10.8)

## 2015-04-29 LAB — BASIC METABOLIC PANEL WITH GFR
BUN: 9 mg/dL (ref 7–25)
CALCIUM: 9.7 mg/dL (ref 8.6–10.4)
CHLORIDE: 105 mmol/L (ref 98–110)
CO2: 25 mmol/L (ref 20–31)
Creat: 0.72 mg/dL (ref 0.50–1.05)
GFR, Est African American: >89 mL/min (ref >=60)
GLUCOSE: 90 mg/dL (ref 65–99)
POTASSIUM: 4 mmol/L (ref 3.5–5.3)
SODIUM: 142 mmol/L (ref 135–146)

## 2015-04-29 LAB — FERRITIN: FERRITIN: 78 ng/mL (ref 10–232)

## 2015-04-29 LAB — TSH: TSH: 1.05 mIU/L

## 2015-04-29 NOTE — Patient Instructions (Signed)
Things to remember from today's visit:   Constipation, Adult Constipation is when a person has fewer than three bowel movements a week, has difficulty having a bowel movement, or has stools that are dry, hard, or larger than normal. As people grow older, constipation is more common. A low-fiber diet, not taking in enough fluids, and taking certain medicines may make constipation worse.  CAUSES   Certain medicines, such as antidepressants, pain medicine, iron supplements, antacids, and water pills.   Certain diseases, such as diabetes, irritable bowel syndrome (IBS), thyroid disease, or depression.   Not drinking enough water.   Not eating enough fiber-rich foods.   Stress or travel.   Lack of physical activity or exercise.   Ignoring the urge to have a bowel movement.   Using laxatives too much.  SIGNS AND SYMPTOMS   Having fewer than three bowel movements a week.   Straining to have a bowel movement.   Having stools that are hard, dry, or larger than normal.   Feeling full or bloated.   Pain in the lower abdomen.   Not feeling relief after having a bowel movement.  DIAGNOSIS  Your health care provider will take a medical history and perform a physical exam. Further testing may be done for severe constipation. Some tests may include:  A barium enema X-ray to examine your rectum, colon, and, sometimes, your small intestine.   A sigmoidoscopy to examine your lower colon.   A colonoscopy to examine your entire colon. TREATMENT  Treatment will depend on the severity of your constipation and what is causing it. Some dietary treatments include drinking more fluids and eating more fiber-rich foods. Lifestyle treatments may include regular exercise. If these diet and lifestyle recommendations do not help, your health care provider may recommend taking over-the-counter laxative medicines to help you have bowel movements. Prescription medicines may be prescribed if  over-the-counter medicines do not work.  HOME CARE INSTRUCTIONS   Eat foods that have a lot of fiber, such as fruits, vegetables, whole grains, and beans.  Limit foods high in fat and processed sugars, such as french fries, hamburgers, cookies, candies, and soda.   A fiber supplement may be added to your diet if you cannot get enough fiber from foods.   Drink enough fluids to keep your urine clear or pale yellow.   Exercise regularly or as directed by your health care provider.   Go to the restroom when you have the urge to go. Do not hold it.   Only take over-the-counter or prescription medicines as directed by your health care provider. Do not take other medicines for constipation without talking to your health care provider first.  [place] IF:   You have bright red blood in your stool.   Your constipation lasts for more than 4 days or gets worse.   You have abdominal or rectal pain.   You have thin, pencil-like stools.   You have unexplained weight loss. MAKE SURE YOU:   Understand these instructions.  Will watch your condition.  Will get help right away if you are not doing well or get worse.   This information is not intended to replace advice given to you by your health care provider. Make sure you discuss any questions you have with your health care provider.   Document Released: 10/01/2003 Document Revised: 01/23/2014 Document Reviewed: 10/14/2012 Elsevier Interactive Patient Education 2016 [employer].   Over the counter Myralax and Bisacodyl 5 mg daily as needed. Marland Kitchen  Increase fluid intake, dizziness can be related to dehydration as well.

## 2015-04-29 NOTE — Progress Notes (Addendum)
Subjective:    Patient ID: Misty Snyder, female    DOB: 1960-02-25, 55 y.o.   MRN: [id number]  HPI Ms.   Misty Snyder is a 55 y.o.female here today complaining of a "little over" a month of abdominal discomfort, feeling lightheaded, and nausea; all she thinks are related and getting worse. She denies any abdominal pain, she describes epigastric bloating sensation, which is exacerbated by food intake. She has a history of constipation, she has had bowel movements every third day small. She states that her last "good" bowel movement was about 3 weeks ago. According to patient she has not tried anything for constipation, she doesn't drink enough water and has not tried to increase her fiber intake; she states that she doesn't have time to do so. She does exercise regularly, she denies any fall. + Fatigue, which she has had for a while. + Mild nausea, no vomiting. She has tried ginger el and OTC Prilosec but have not helped.   No abnormal wt loss. She has occasional blood with defecation, attributed to internal hemorrhoids, no dyschezia, last episode was a week ago. She reports last colonoscopy in 2010, recommended 10 years follow up. FHx negative for colon cancer.   -Dizziness: She describes as lightheaded, intermittent, it happens daily, she doesn't feel like she is spinning. It is usually exacerbated by rapid standing, in last about a minute, it is not associated with visual changes, chest pain, palpitations, dyspnea, or diaphoresis. She denies any hearing loss, tinnitus, or earache. She completed chemo for breast cancer in 11/2014, then she had labs: CMP and CBC, the latter one showed mild anemia.   Lab Results  Component Value Date   WBC 4.1 12/02/2014   HGB 10.9* 12/02/2014   HCT 34.8 12/02/2014   MCV 91.6 12/02/2014   PLT 302 12/02/2014      Chemistry      Component Value Date/Time   NA 142 12/02/2014 0757   NA 136 06/18/2014 0400   K 3.6 12/02/2014 0757   K  3.6 06/18/2014 0400   CL 103 06/18/2014 0400   CO2 27 12/02/2014 0757   CO2 23 06/18/2014 0400   BUN 7.3 12/02/2014 0757   BUN 6 06/18/2014 0400   CREATININE 0.7 12/02/2014 0757   CREATININE 0.76 06/18/2014 0400      Component Value Date/Time   CALCIUM 9.3 12/02/2014 0757   CALCIUM 8.1* 06/18/2014 0400   ALKPHOS 74 12/02/2014 0757   ALKPHOS 66 02/10/2014 1555   AST 23 12/02/2014 0757   AST 16 02/10/2014 1555   ALT 24 12/02/2014 0757   ALT 13 02/10/2014 1555   BILITOT <0.30 12/02/2014 0757   BILITOT 0.3 02/10/2014 1555       Review of Systems  Constitutional: Positive for fatigue. Negative for fever, chills, activity change, appetite change and unexpected weight change.  HENT: Negative for congestion, ear pain, hearing loss, mouth sores, sore throat, tinnitus and trouble swallowing.   Eyes: Negative for visual disturbance.  Respiratory: Negative for cough, shortness of breath and wheezing.   Cardiovascular: Negative for chest pain, palpitations and leg swelling.  Gastrointestinal: Positive for nausea and anal bleeding. Negative for vomiting, abdominal pain (bloating sensation), diarrhea, constipation, blood in stool, abdominal distention and rectal pain.  Endocrine: Negative for cold intolerance and heat intolerance.  Genitourinary: Negative for dysuria, frequency, hematuria, vaginal bleeding, vaginal discharge and menstrual problem.  Musculoskeletal: Positive for back pain (Chronic). Negative for arthralgias and neck pain.  Neurological: Positive for  dizziness. Negative for tremors, weakness, numbness and headaches.  Hematological: Negative for adenopathy. Does not bruise/bleed easily.  Psychiatric/Behavioral: Positive for sleep disturbance (On Ambien). Negative for behavioral problems. The patient is not nervous/anxious.        Hx of depression.     Current Outpatient Prescriptions on File Prior to Visit  Medication Sig Dispense Refill  . naproxen (NAPROSYN) 500 MG tablet  Take 1 tablet (500 mg total) by mouth 2 (two) times daily with a meal. 60 tablet 1   No current facility-administered medications on file prior to visit.     Past Medical History  Diagnosis Date  . Depression   . Uterine fibroid   . Malignant neoplasm of upper-outer quadrant of right female breast ([place])   . Breast cancer ([place]) 05/15/14    right breast invasive ductal carcinoma    Social History   Social History  . Marital Status: Single    Spouse Name: N/A  . Number of Children: N/A  . Years of Education: N/A   Social History Main Topics  . Smoking status: Never Smoker   . Smokeless tobacco: Never Used  . Alcohol Use: No  . Drug Use: No  . Sexual Activity: Not Asked   Other Topics Concern  . None   Social History Narrative    Filed Vitals:   04/29/15 1617  BP: 130/80  Pulse: 85  Temp: 98.7 F (37.1 C)  Resp: 12   Body mass index is 30.48 kg/(m^2).  SpO2 Readings from Last 3 Encounters:  04/29/15 99%  02/08/15 99%  12/02/14 98%       Objective:   Physical Exam  Constitutional: She is oriented to person, place, and time. She appears well-developed. No distress.  HENT:  Head: Atraumatic.  Mouth/Throat: Oropharynx is clear and moist. No oropharyngeal exudate.  Cerumen excess bilateral, TM's seen partially, no erythema  Eyes: Conjunctivae and EOM are normal. Pupils are equal, round, and reactive to light.  Neck: Carotid bruit is not present. No thyroid mass and no thyromegaly present.  Cardiovascular: Normal rate and regular rhythm.   No murmur heard. Pulses:      Dorsalis pedis pulses are 2+ on the right side, and 2+ on the left side.  Pulmonary/Chest: Effort normal and breath sounds normal. No respiratory distress. She has no wheezes. She has no rhonchi. She has no rales.  Abdominal: Soft. Bowel sounds are normal. She exhibits no distension and no mass. There is no hepatosplenomegaly. There is no tenderness. There is no CVA tenderness.  Musculoskeletal:  She exhibits no edema or tenderness.  Lymphadenopathy:    She has no cervical adenopathy.  Neurological: She is alert and oriented to person, place, and time. She has normal strength. She displays no tremor. No cranial nerve deficit. She displays a negative Romberg sign. Coordination and gait normal.  Reflex Scores:      Patellar reflexes are 2+ on the right side and 2+ on the left side. Skin: Skin is warm. No rash noted.  Psychiatric: She has a normal mood and affect. Her speech is normal.  Well groomed and good eye contact.        Assessment & Plan:    Misty Snyder was seen today for dizziness, abdominal pain and nausea.  Diagnoses and all orders for this visit:  Constipation, chronic I think this is causing her bloating sensation. Recommended increasing fiber and fluid intake. OTC Myralax and Bisacodyl might help but she needs to be well hydrated, some side effects  discussed.   -     TSH  Dizziness and giddiness Possible causes discussed. Increase fluid intake, fall precautions, and instructed about warning signs.  -     BMP with eGFR -     Ferritin  Anemia, unspecified anemia type Could contribute to her dizziness. She is not on treatment. Further recommendations will be given according to lab results.  -     CBC with Differential/Platelet  Lab Results  Component Value Date   WBC 4.9 04/29/2015   HGB 12.4 04/29/2015   HCT 38.5 04/29/2015   MCV 80.7 04/29/2015   PLT 261 04/29/2015   Lab Results  Component Value Date   TSH 1.05 04/29/2015   Lab Results  Component Value Date   FERRITIN 78 04/29/2015      Betty G. Martinique, MD  [facility] [place] office.

## 2015-05-03 ENCOUNTER — Encounter: Payer: Self-pay | Admitting: Family Medicine

## 2015-05-20 ENCOUNTER — Encounter: Payer: Self-pay | Admitting: Adult Health

## 2015-05-20 ENCOUNTER — Ambulatory Visit (INDEPENDENT_AMBULATORY_CARE_PROVIDER_SITE_OTHER): Payer: BC Managed Care – PPO | Admitting: Adult Health

## 2015-05-20 VITALS — BP 106/58 | Temp 98.4°F | Wt 171.3 lb

## 2015-05-20 DIAGNOSIS — J069 Acute upper respiratory infection, unspecified: Secondary | ICD-10-CM | POA: Diagnosis not present

## 2015-05-20 MED ORDER — BENZONATATE 200 MG PO CAPS: 200.0000 mg | ORAL_CAPSULE | Freq: Three times a day (TID) | ORAL | Status: DC | PRN

## 2015-05-20 MED ORDER — DOXYCYCLINE HYCLATE 100 MG PO CAPS
100.0000 mg | ORAL_CAPSULE | Freq: Two times a day (BID) | ORAL | Status: DC
Start: 2015-05-20 — End: 2016-06-14

## 2015-05-20 MED ORDER — HYDROCODONE-HOMATROPINE 5-1.5 MG/5ML PO SYRP
5.0000 mL | ORAL_SOLUTION | Freq: Three times a day (TID) | ORAL | Status: DC | PRN
Start: 2015-05-20 — End: 2016-06-14

## 2015-05-20 NOTE — Progress Notes (Signed)
Subjective:    Patient ID: Misty Snyder, female    DOB: December 28, 1960, 55 y.o.   MRN: [id number]  URI  This is a new problem. The current episode started in the past 7 days. The problem has been waxing and waning. The maximum temperature recorded prior to her arrival was 100.4 - 100.9 F. The fever has been present for 1 to 2 days. Associated symptoms include congestion, coughing (Productive ), headaches, rhinorrhea and a sore throat. Pertinent negatives include no ear pain, sinus pain or wheezing. She has tried decongestant, acetaminophen, NSAIDs and antihistamine for the symptoms. The treatment provided no relief.      Review of Systems  HENT: Positive for congestion, postnasal drip, rhinorrhea and sore throat. Negative for ear pain and sinus pressure.   Eyes: Negative.   Respiratory: Positive for cough (Productive ). Negative for shortness of breath and wheezing.   Neurological: Positive for headaches.  Hematological: Positive for adenopathy.   Past Medical History  Diagnosis Date  . Depression   . Uterine fibroid   . Malignant neoplasm of upper-outer quadrant of right female breast ([place])   . Breast cancer ([place]) 05/15/14    right breast invasive ductal carcinoma    Social History   Social History  . Marital Status: Single    Spouse Name: N/A  . Number of Children: N/A  . Years of Education: N/A   Occupational History  . Not on file.   Social History Main Topics  . Smoking status: Never Smoker   . Smokeless tobacco: Never Used  . Alcohol Use: No  . Drug Use: No  . Sexual Activity: Not on file   Other Topics Concern  . Not on file   Social History Narrative    Past Surgical History  Procedure Laterality Date  . Neck surgery    . Mastectomy w/ sentinel node biopsy Right 06/18/2014    WITH PORT A CATH PLACEMENT  . Insertion central venous access device w/ subcutaneous port  06/18/2014  . Mastectomy w/ sentinel node biopsy Right 06/17/2014    Procedure: RIGHT  MASTECTOMY WITH SENTINEL LYMPH NODE BIOPSY WITH BLUE DYE MAPPING AND PORT PLACEMENT;  Surgeon: Donnie Mesa, MD;  Location: [facility];  Service: General;  Laterality: Right;  . Portacath placement Left 06/17/2014    Procedure: INSERTION PORT-A-CATH;  Surgeon: Donnie Mesa, MD;  Location: [facility];  Service: General;  Laterality: Left;    Family History  Problem Relation Age of Onset  . Alcohol abuse Other   . Cancer Maternal Grandmother     Dx 53s; prostate  . Cancer Maternal Grandfather     Dx 63s; unknown type    No Known Allergies  Current Outpatient Prescriptions on File Prior to Visit  Medication Sig Dispense Refill  . naproxen (NAPROSYN) 500 MG tablet Take 1 tablet (500 mg total) by mouth 2 (two) times daily with a meal. 60 tablet 1   No current facility-administered medications on file prior to visit.    BP 106/58 mmHg  Temp(Src) 98.4 F (36.9 C) (Oral)  Wt 171 lb 4.8 oz (77.701 kg)  LMP 03/17/2011       Objective:   Physical Exam  Constitutional: She is oriented to person, place, and time. She appears well-developed and well-nourished. No distress.  HENT:  Head: Normocephalic and atraumatic.  Right Ear: Hearing, tympanic membrane, external ear and ear canal normal.  Left Ear: Hearing, tympanic membrane, external ear and ear canal normal.  Nose: Mucosal edema  and rhinorrhea present.  Mouth/Throat: Uvula is midline and oropharynx is clear and moist. No oropharyngeal exudate, posterior oropharyngeal edema or posterior oropharyngeal erythema.  Neck: Normal range of motion. Neck supple. No thyromegaly present.  Cardiovascular: Normal rate, regular rhythm, normal heart sounds and intact distal pulses.  Exam reveals no friction rub.   No murmur heard. Pulmonary/Chest: Effort normal and breath sounds normal. No respiratory distress. She has no wheezes. She has no rales. She exhibits no tenderness.  Lymphadenopathy:       Head (right side): Submental adenopathy present.        Head (left side): Submental adenopathy present.  Neurological: She is alert and oriented to person, place, and time.  Skin: Skin is warm and dry. No rash noted. She is not diaphoretic. No erythema. No pallor.  Psychiatric: She has a normal mood and affect. Her behavior is normal. Judgment and thought content normal.  Nursing note and vitals reviewed.      Assessment & Plan:  1. URI (upper respiratory infection) - doxycycline (VIBRAMYCIN) 100 MG capsule; Take 1 capsule (100 mg total) by mouth 2 (two) times daily.  Dispense: 20 capsule; Refill: 0 - HYDROcodone-homatropine (HYCODAN) 5-1.5 MG/5ML syrup; Take 5 mLs by mouth every 8 (eight) hours as needed for cough.  Dispense: 120 mL; Refill: 0 - benzonatate (TESSALON) 200 MG capsule; Take 1 capsule (200 mg total) by mouth 3 (three) times daily as needed for cough.  Dispense: 20 capsule; Refill: 1 - Flonase - Normal Saline Spray - Follow up if no improvement

## 2015-05-20 NOTE — Patient Instructions (Addendum)
It was great meeting you today!  Your exam is consistent with a upper respiratory infection.   I have sent in a Doxycycline- take this twice a day for 10 days Hycodan - use this cough syrup at night Tessalon Pearls - you can use these during the day for cough   Follow up if no improvement   Upper Respiratory Infection, Adult Most upper respiratory infections (URIs) are a viral infection of the air passages leading to the lungs. A URI affects the nose, throat, and upper air passages. The most common type of URI is nasopharyngitis and is typically referred to as "the common cold." URIs run their course and usually go away on their own. Most of the time, a URI does not require medical attention, but sometimes a bacterial infection in the upper airways can follow a viral infection. This is called a secondary infection. Sinus and middle ear infections are common types of secondary upper respiratory infections. Bacterial pneumonia can also complicate a URI. A URI can worsen asthma and chronic obstructive pulmonary disease (COPD). Sometimes, these complications can require emergency medical care and may be life threatening.  CAUSES Almost all URIs are caused by viruses. A virus is a type of germ and can spread from one person to another.  RISKS FACTORS You may be at risk for a URI if:   You smoke.   You have chronic heart or lung disease.  You have a weakened defense (immune) system.   You are very young or very old.   You have nasal allergies or asthma.  You work in crowded or poorly ventilated areas.  You work in health care facilities or schools. SIGNS AND SYMPTOMS  Symptoms typically develop 2-3 days after you come in contact with a cold virus. Most viral URIs last 7-10 days. However, viral URIs from the influenza virus (flu virus) can last 14-18 days and are typically more severe. Symptoms may include:   Runny or stuffy (congested) nose.   Sneezing.   Cough.   Sore  throat.   Headache.   Fatigue.   Fever.   Loss of appetite.   Pain in your forehead, behind your eyes, and over your cheekbones (sinus pain).  Muscle aches.  DIAGNOSIS  Your health care provider may diagnose a URI by:  Physical exam.  Tests to check that your symptoms are not due to another condition such as:  Strep throat.  Sinusitis.  Pneumonia.  Asthma. TREATMENT  A URI goes away on its own with time. It cannot be cured with medicines, but medicines may be prescribed or recommended to relieve symptoms. Medicines may help:  Reduce your fever.  Reduce your cough.  Relieve nasal congestion. HOME CARE INSTRUCTIONS   Take medicines only as directed by your health care provider.   Gargle warm saltwater or take cough drops to comfort your throat as directed by your health care provider.  Use a warm mist humidifier or inhale steam from a shower to increase air moisture. This may make it easier to breathe.  Drink enough fluid to keep your urine clear or pale yellow.   Eat soups and other clear broths and maintain good nutrition.   Rest as needed.   Return to work when your temperature has returned to normal or as your health care provider advises. You may need to stay home longer to avoid infecting others. You can also use a face mask and careful hand washing to prevent spread of the virus.  Increase the  usage of your inhaler if you have asthma.   Do not use any tobacco products, including cigarettes, chewing tobacco, or electronic cigarettes. If you need help quitting, ask your health care provider. PREVENTION  The best way to protect yourself from getting a cold is to practice good hygiene.   Avoid oral or hand contact with people with cold symptoms.   Wash your hands often if contact occurs.  There is no clear evidence that vitamin C, vitamin E, echinacea, or exercise reduces the chance of developing a cold. However, it is always recommended to  get plenty of rest, exercise, and practice good nutrition.  SEEK MEDICAL CARE IF:   You are getting worse rather than better.   Your symptoms are not controlled by medicine.   You have chills.  You have worsening shortness of breath.  You have brown or red mucus.  You have yellow or brown nasal discharge.  You have pain in your face, especially when you bend forward.  You have a fever.  You have swollen neck glands.  You have pain while swallowing.  You have white areas in the back of your throat. SEEK IMMEDIATE MEDICAL CARE IF:   You have severe or persistent:  Headache.  Ear pain.  Sinus pain.  Chest pain.  You have chronic lung disease and any of the following:  Wheezing.  Prolonged cough.  Coughing up blood.  A change in your usual mucus.  You have a stiff neck.  You have changes in your:  Vision.  Hearing.  Thinking.  Mood. MAKE SURE YOU:   Understand these instructions.  Will watch your condition.  Will get help right away if you are not doing well or get worse.   This information is not intended to replace advice given to you by your health care provider. Make sure you discuss any questions you have with your health care provider.   Document Released: 06/28/2000 Document Revised: 05/19/2014 Document Reviewed: 04/09/2013 Elsevier Interactive Patient Education [employer].

## 2015-08-09 ENCOUNTER — Encounter: Payer: Self-pay | Admitting: Hematology and Oncology

## 2015-08-09 ENCOUNTER — Telehealth: Payer: Self-pay | Admitting: Hematology and Oncology

## 2015-08-09 ENCOUNTER — Ambulatory Visit (HOSPITAL_BASED_OUTPATIENT_CLINIC_OR_DEPARTMENT_OTHER): Payer: BC Managed Care – PPO | Admitting: Hematology and Oncology

## 2015-08-09 DIAGNOSIS — Z853 Personal history of malignant neoplasm of breast: Secondary | ICD-10-CM

## 2015-08-09 DIAGNOSIS — C50411 Malignant neoplasm of upper-outer quadrant of right female breast: Secondary | ICD-10-CM

## 2015-08-09 DIAGNOSIS — G629 Polyneuropathy, unspecified: Secondary | ICD-10-CM | POA: Diagnosis not present

## 2015-08-09 NOTE — Telephone Encounter (Signed)
Gave patient avs report and appointments for January 2018. Lab/fu already on schedule.

## 2015-08-09 NOTE — Assessment & Plan Note (Signed)
Right breast mastectomy 06/17/2014: IDC, 3.6 cm, grade 3, 0/4 lymph nodes, T2 N0 M0 stage II a, ER 0%, PR 0%, HER-2 negative, Ki-67 90% Adjuvant chemotherapy with dose dense Adriamycin and Cytoxan 4 followed by Abraxane weekly 12 from 07/22/2014 to 12/02/2014  Breast Cancer Surveillance: 1. Breast exam 08/09/2015: Normal Breast exam on the left breast. Right chest wall scar without any abnormalities. 2. Mammogram : An ultrasound 02/16/2015: No mammographic or ultrasound evidence of malignancy within the left breast. (Patient had breast pain that led to the ultrasound evaluation)  Neuropathy: Off narcotic pain medications.  Return to clinic in 6 months for continued surveillance and follow-up. 

## 2015-08-09 NOTE — Progress Notes (Signed)
Patient Care Team: Dorena Cookey, MD as PCP - General Nicholas Lose, MD as Consulting Physician (Hematology and Oncology) Donnie Mesa, MD as Consulting Physician (General Surgery) Sylvan Cheese, NP as Nurse Practitioner (Hematology and Oncology)  DIAGNOSIS: Breast cancer of upper-outer quadrant of right female breast [facility])   Staging form: Breast, AJCC 7th Edition   - Clinical: Stage IIA (T2, N0, M0) - Unsigned   - Pathologic stage from 06/17/2014: Stage IIA (T2, N0, cM0) - Unsigned  SUMMARY OF ONCOLOGIC HISTORY:   Breast cancer of upper-outer quadrant of right female breast ([place])   05/15/2014 Mammogram    Right breast: 2.5 cm mass noted at 10:00     05/15/2014 Breast US    Right breast: irregular hypoechoic mass at 10 o'clock,8 cm from the nipple measuring 2.6 x 1.4 x 2 cm. Right axillary LN with a mildly thickened cortex is measures 1.3 x 0.9 x 1 cm.     05/15/2014 Initial Biopsy    Right breast biopsy 10:00: Invasive ductal carcinoma grade 3, ER 0% PR 0% HER-2 negative, Ki-67 90%     05/20/2014 Breast MRI    Right breast mass: 2.7 x 2.7 x 3.3 cm along with 3 cm non-mass enhancement, upper inner quadrant 8 mm mass, lower inner quadrant 6 mm mass, right axillary lymph nodes 1.2 cm, T2 bright liver lesions     06/02/2014 Imaging    Abdominal MR: Multiple benign hepatic cysts.     06/02/2014 Clinical Stage    Stage IIA: T2 N0     06/03/2014 Procedure    Genetic testing: BreastNext panel Cephus Shelling) reveals no clinically significant variant at ATM, BARD1, BRCA1, BRCA2, BRIP1, CDH1, CHEK2, MRE11A, MUTYH, NBN, NF1, PALB2, PTEN, RAD50, RAD51C, RAD51D, and TP53     06/17/2014 Definitive Surgery    Right breast mastectomy: IDC, 3.6 cm, grade 3, 0/4 lymph nodes,  ER 0%, PR 0%, HER-2 negative, Ki-67 90%     06/17/2014 Pathologic Stage    Stage IIA: pT2 pN0     07/22/2014 - 12/02/2014 Adjuvant Chemotherapy    Adjuvant chemotherapy with dose dense Adriamycin and Cytoxan 4 followed by  Abraxane weekly 12     12/31/2014 Survivorship    Survivorship care plan completed and mailed to patient in lieu of in person visit.      CHIEF COMPLIANT: Surveillance of breast cancer   INTERVAL HISTORY: Misty Snyder is a 55 year old with above-mentioned history of right breast cancer treated with right mastectomy followed by adjuvant chemotherapy. She is here for a six-month follow-up and reports no new problems or concerns. 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 She denies any lumps or nodules. The breast pain has resolved. She had a mammogram in January 2017 which was followed by ultrasound both of which were normal. She is not participating in the live strong program and appears to be getting stronger. She has lost almost 20 pounds and is feeling happy and healthy.  REVIEW OF SYSTEMS:   Constitutional: Denies fevers, chills or abnormal weight loss Eyes: Denies blurriness of vision Ears, nose, mouth, throat, and face: Denies mucositis or sore throat Respiratory: Denies cough, dyspnea or wheezes Cardiovascular: Denies palpitation, chest discomfort Gastrointestinal:  Denies nausea, heartburn or change in bowel habits Skin: Denies abnormal skin rashes Lymphatics: Denies new lymphadenopathy or easy bruising Neurological:Denies numbness, tingling or new weaknesses Behavioral/Psych: Mood is stable, no new changes  Extremities: No lower extremity edema Breast:  denies any pain or lumps or nodules in left breast, right mastectomy All other systems were reviewed with the patient and are negative.  I have reviewed the past medical history, past surgical history, social history and family history with the patient and they are unchanged from previous note.  ALLERGIES:  has No Known Allergies.  MEDICATIONS:  Current Outpatient Prescriptions  Medication Sig Dispense Refill  . benzonatate (TESSALON) 200 MG capsule Take 1 capsule (200 mg  total) by mouth 3 (three) times daily as needed for cough. 20 capsule 1  . doxycycline (VIBRAMYCIN) 100 MG capsule Take 1 capsule (100 mg total) by mouth 2 (two) times daily. 20 capsule 0  . HYDROcodone-homatropine (HYCODAN) 5-1.5 MG/5ML syrup Take 5 mLs by mouth every 8 (eight) hours as needed for cough. 120 mL 0  . naproxen (NAPROSYN) 500 MG tablet Take 1 tablet (500 mg total) by mouth 2 (two) times daily with a meal. 60 tablet 1  . traZODone (DESYREL) 150 MG tablet Take 150 mg by mouth at bedtime.     No current facility-administered medications for this visit.     PHYSICAL EXAMINATION: ECOG PERFORMANCE STATUS: 0 - Asymptomatic  Vitals:   08/09/15 1347  BP: (!) 136/93  Pulse: 95  Resp: 18  Temp: 98 F (36.7 C)   Filed Weights   08/09/15 1347  Weight: 168 lb 12.8 oz (76.6 kg)    GENERAL:alert, no distress and comfortable SKIN: skin color, texture, turgor are normal, no rashes or significant lesions EYES: normal, Conjunctiva are pink and non-injected, sclera clear OROPHARYNX:no exudate, no erythema and lips, buccal mucosa, and tongue normal  NECK: supple, thyroid normal size, non-tender, without nodularity LYMPH:  no palpable lymphadenopathy in the cervical, axillary or inguinal LUNGS: clear to auscultation and percussion with normal breathing effort HEART: regular rate & rhythm and no murmurs and no lower extremity edema ABDOMEN:abdomen soft, non-tender and normal bowel sounds MUSCULOSKELETAL:no cyanosis of digits and no clubbing  NEURO: alert & oriented x 3 with fluent speech, no focal motor/sensory deficits EXTREMITIES: No lower extremity edema BREAST:No palpable lumps or nodules in the left breast or axilla. Right chest wall is without any lumps or nodules. No axillary lymphadenopathy. (exam performed in the presence of a chaperone)  LABORATORY DATA:  I have reviewed the data as listed   Chemistry      Component Value Date/Time   NA 142 04/29/2015 1705   NA 142  12/02/2014 0757   K 4.0 04/29/2015 1705   K 3.6 12/02/2014 0757   CL 105 04/29/2015 1705   CO2 25 04/29/2015 1705   CO2 27 12/02/2014 0757   BUN 9 04/29/2015 1705   BUN 7.3 12/02/2014 0757   CREATININE 0.72 04/29/2015 1705   CREATININE 0.7 12/02/2014 0757      Component Value Date/Time   CALCIUM 9.7 04/29/2015 1705   CALCIUM 9.3 12/02/2014 0757   ALKPHOS 74 12/02/2014 0757   AST 23 12/02/2014 0757   ALT 24 12/02/2014 0757   BILITOT <0.30 12/02/2014 0757       Lab Results  Component Value Date   WBC 4.9 04/29/2015   HGB 12.4 04/29/2015   HCT 38.5 04/29/2015   MCV 80.7 04/29/2015   PLT 261 04/29/2015   NEUTROABS 3,479 04/29/2015     ASSESSMENT & PLAN:  Breast cancer of upper-outer quadrant of right female breast Right breast mastectomy 06/17/2014: IDC, 3.6 cm, grade 3, 0/4 lymph nodes, T2 N0 M0 stage II a, ER 0%, PR 0%,  HER-2 negative, Ki-67 90% Adjuvant chemotherapy with dose dense Adriamycin and Cytoxan 4 followed by Abraxane weekly 12 from 07/22/2014 to 12/02/2014  Breast Cancer Surveillance: 1. Breast exam 08/09/2015: Normal  Breast exam on the left breast. Right chest wall scar without any abnormalities. 2. Mammogram : An ultrasound 02/16/2015: No mammographic or ultrasound evidence of malignancy within the left breast. (Patient had breast pain that led to the ultrasound evaluation)  Neuropathy: Off narcotic pain medications.  Return to clinic in 6 months for continued surveillance and follow-up.   No orders of the defined types were placed in this encounter.  The patient has a good understanding of the overall plan. she agrees with it. she will call with any problems that may develop before the next visit here.   Rulon Eisenmenger, MD 08/09/15

## 2016-01-13 ENCOUNTER — Other Ambulatory Visit: Payer: Self-pay | Admitting: Nurse Practitioner

## 2016-01-26 ENCOUNTER — Other Ambulatory Visit: Payer: Self-pay | Admitting: Family Medicine

## 2016-01-26 DIAGNOSIS — N6012 Diffuse cystic mastopathy of left breast: Secondary | ICD-10-CM

## 2016-02-08 NOTE — Assessment & Plan Note (Deleted)
Right breast mastectomy 06/17/2014: IDC, 3.6 cm, grade 3, 0/4 lymph nodes, T2 N0 M0 stage II a, ER 0%, PR 0%, HER-2 negative, Ki-67 90% Adjuvant chemotherapy with dose dense Adriamycin and Cytoxan 4 followed by Abraxane weekly 12 from 07/22/2014 to 12/02/2014  Breast Cancer Surveillance: 1. Breast exam 08/09/2015: Normal Breast exam on the left breast. Right chest wall scar without any abnormalities. 2. Mammogram : An ultrasound 02/16/2015: No mammographic or ultrasound evidence of malignancy within the left breast. (Patient had breast pain that led to the ultrasound evaluation)  Neuropathy: Off narcotic pain medications.  Return to clinic in 6 months for continued surveillance and follow-up.

## 2016-02-09 ENCOUNTER — Telehealth: Payer: Self-pay | Admitting: Hematology and Oncology

## 2016-02-09 ENCOUNTER — Ambulatory Visit: Payer: BC Managed Care – PPO | Admitting: Hematology and Oncology

## 2016-02-09 NOTE — Telephone Encounter (Signed)
Patient called to reschedule appointment from 02/09/16 to 03/16/16.

## 2016-02-16 ENCOUNTER — Other Ambulatory Visit (INDEPENDENT_AMBULATORY_CARE_PROVIDER_SITE_OTHER): Payer: BC Managed Care – PPO

## 2016-02-16 DIAGNOSIS — Z0000 Encounter for general adult medical examination without abnormal findings: Secondary | ICD-10-CM | POA: Diagnosis not present

## 2016-02-16 LAB — CBC WITH DIFFERENTIAL/PLATELET
BASOS PCT: 0.6 % (ref 0.0–3.0)
Basophils Absolute: 0 10*3/uL (ref 0.0–0.1)
EOS ABS: 0 10*3/uL (ref 0.0–0.7)
Eosinophils Relative: 0.9 % (ref 0.0–5.0)
HCT: 38.3 % (ref 36.0–46.0)
Hemoglobin: 12.5 g/dL (ref 12.0–15.0)
Lymphocytes Relative: 21.4 % (ref 12.0–46.0)
Lymphs Abs: 0.8 10*3/uL (ref 0.7–4.0)
MCHC: 32.7 g/dL (ref 30.0–36.0)
MCV: 85.2 fl (ref 78.0–100.0)
MONO ABS: 0.4 10*3/uL (ref 0.1–1.0)
Monocytes Relative: 11.6 % (ref 3.0–12.0)
NEUTROS ABS: 2.4 10*3/uL (ref 1.4–7.7)
Neutrophils Relative %: 65.5 % (ref 43.0–77.0)
PLATELETS: 243 10*3/uL (ref 150.0–400.0)
RBC: 4.49 Mil/uL (ref 3.87–5.11)
RDW: 16.3 % — AB (ref 11.5–15.5)
WBC: 3.7 10*3/uL — ABNORMAL LOW (ref 4.0–10.5)

## 2016-02-16 LAB — HEPATIC FUNCTION PANEL
ALT: 12 U/L (ref 0–35)
AST: 15 U/L (ref 0–37)
Albumin: 4.1 g/dL (ref 3.5–5.2)
Alkaline Phosphatase: 57 U/L (ref 39–117)
BILIRUBIN DIRECT: 0.1 mg/dL (ref 0.0–0.3)
TOTAL PROTEIN: 7 g/dL (ref 6.0–8.3)
Total Bilirubin: 0.4 mg/dL (ref 0.2–1.2)

## 2016-02-16 LAB — POC URINALSYSI DIPSTICK (AUTOMATED)
Bilirubin, UA: NEGATIVE
Glucose, UA: NEGATIVE
Ketones, UA: NEGATIVE
LEUKOCYTES UA: NEGATIVE
NITRITE UA: NEGATIVE
Protein, UA: NEGATIVE
SPEC GRAV UA: 1.025
UROBILINOGEN UA: 0.2
pH, UA: 6

## 2016-02-16 LAB — BASIC METABOLIC PANEL
BUN: 11 mg/dL (ref 6–23)
CHLORIDE: 108 meq/L (ref 96–112)
CO2: 29 meq/L (ref 19–32)
CREATININE: 0.69 mg/dL (ref 0.40–1.20)
Calcium: 9 mg/dL (ref 8.4–10.5)
GFR: 113.52 mL/min (ref >60.00)
Glucose, Bld: 99 mg/dL (ref 70–99)
Potassium: 3.8 mEq/L (ref 3.5–5.1)
Sodium: 142 mEq/L (ref 135–145)

## 2016-02-16 LAB — LIPID PANEL
CHOL/HDL RATIO: 3
CHOLESTEROL: 214 mg/dL — AB (ref 0–200)
HDL: 64.3 mg/dL (ref >39.00)
LDL Cholesterol: 136 mg/dL — ABNORMAL HIGH (ref 0–99)
NonHDL: 149.93
TRIGLYCERIDES: 72 mg/dL (ref 0.0–149.0)
VLDL: 14.4 mg/dL (ref 0.0–40.0)

## 2016-02-16 LAB — TSH: TSH: 1.85 u[IU]/mL (ref 0.35–4.50)

## 2016-02-17 ENCOUNTER — Ambulatory Visit
Admission: RE | Admit: 2016-02-17 | Discharge: 2016-02-17 | Disposition: A | Discharge status: Home / Self Care | Payer: BC Managed Care – PPO | Source: Ambulatory Visit | Attending: Family Medicine | Admitting: Family Medicine

## 2016-02-17 DIAGNOSIS — N6012 Diffuse cystic mastopathy of left breast: Secondary | ICD-10-CM

## 2016-02-21 ENCOUNTER — Encounter: Payer: BC Managed Care – PPO | Admitting: Family Medicine

## 2016-03-11 IMAGING — MR MR BREAST BILAT WO/W CM
8 of 13 series · 31 of 48 positions shown · IV contrast (multihance)
Comparison: Previous exam(s).

CLINICAL DATA: Patient with recent diagnosis right breast invasive
ductal carcinoma.

EXAM:
BILATERAL BREAST MRI WITH AND WITHOUT CONTRAST
TECHNIQUE: Multiplanar, multisequence MR images of both breasts were obtained
prior to and following the intravenous administration of 17 ml of
MultiHance.

[Series 2: T2 · axial · 3.0mm · 0.94mm/px · z∈[-48,+114]mm · 3 of 55 slices shown]
[im 1/55]
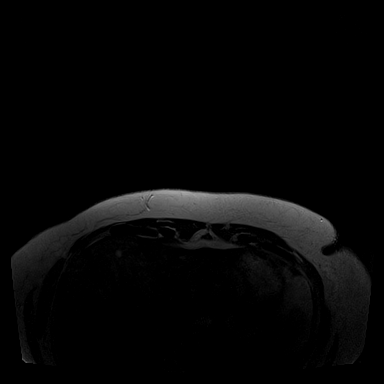
[im 28/55]
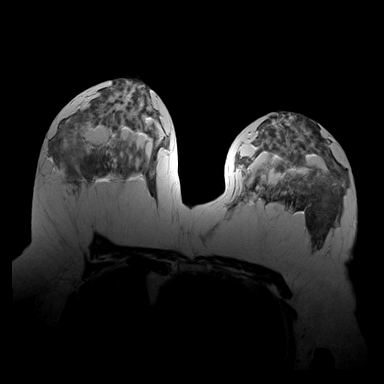
[im 55/55]
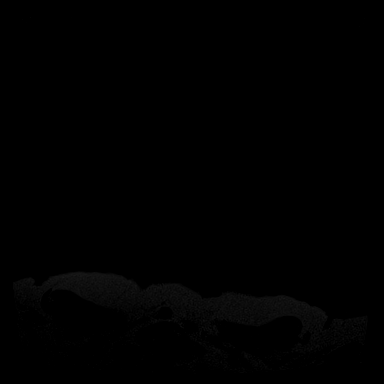

[Series 3: t2_tirm_tra ipat (a-p) · axial · 3.0mm · 0.70mm/px · z∈[-48,+114]mm · 2 of 55 slices shown]
[im 1/55]
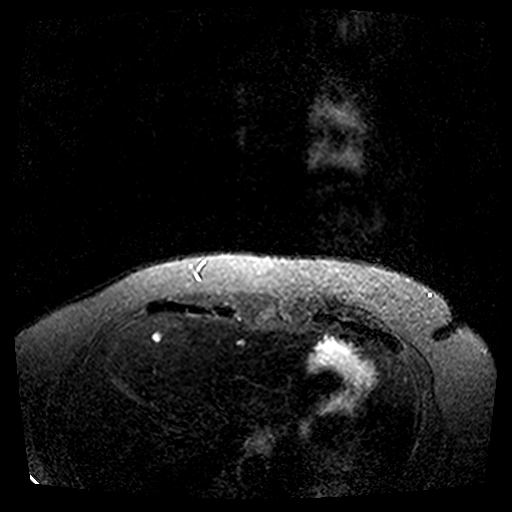
[im 55/55]
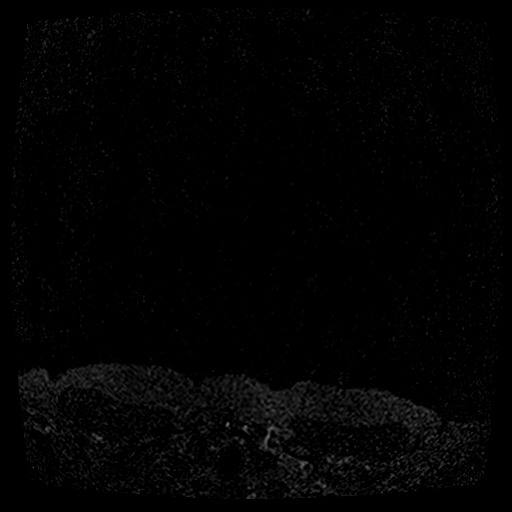

[Series 4: fl3d pre-cm no · axial · non-contrast · 1.2mm · 0.94mm/px · z∈[-53,+118]mm · 5 of 144 slices shown]
[im 1/144]
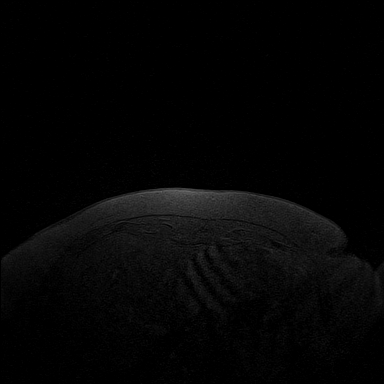
[im 36/144]
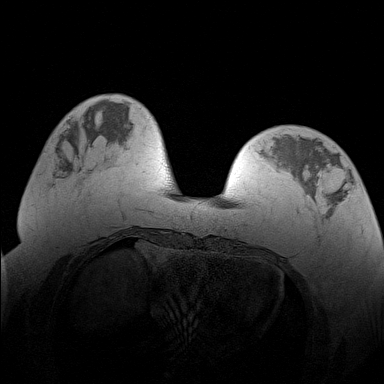
[im 72/144]
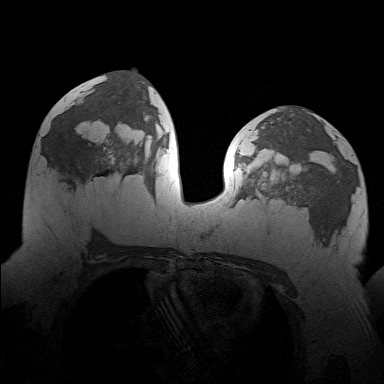
[im 108/144]
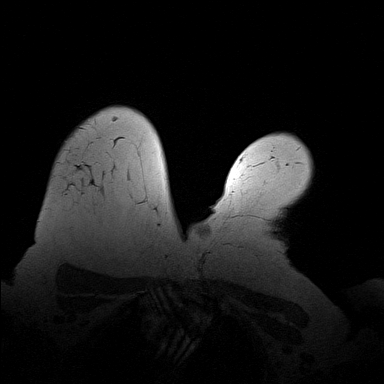
[im 144/144]
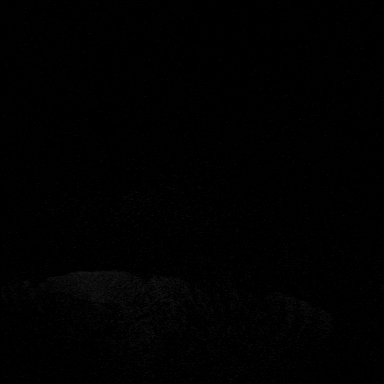

[Series 5: fl3d pre-cm · axial · non-contrast · 1.2mm · 0.94mm/px · z∈[-53,+118]mm · 5 of 144 slices shown]
[im 1/144]
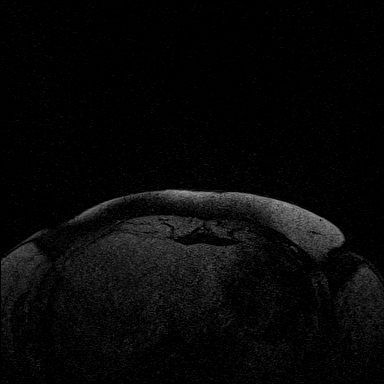
[im 36/144]
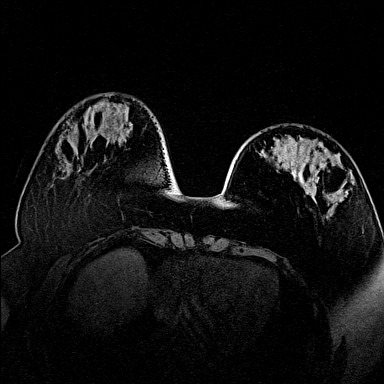
[im 72/144]
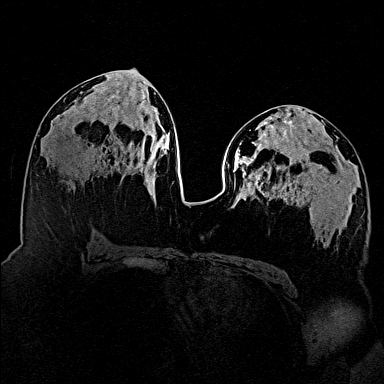
[im 108/144]
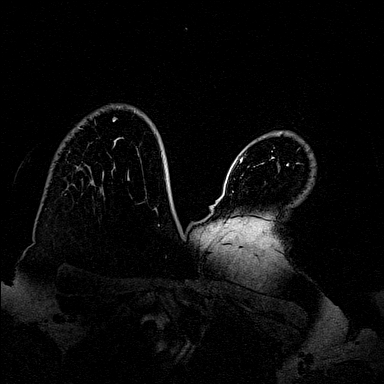
[im 144/144]
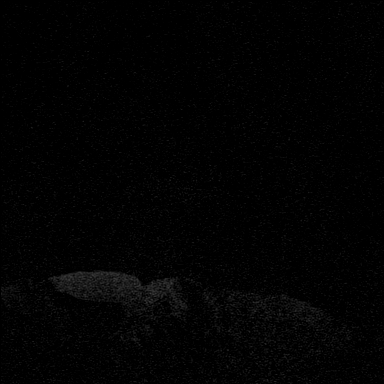

[Series 6: fl3d post-cm 20 · axial · 1.2mm · 0.94mm/px · z∈[-53,+118]mm · 5 of 144 slices shown (1 of 3)]
[im 1/144]
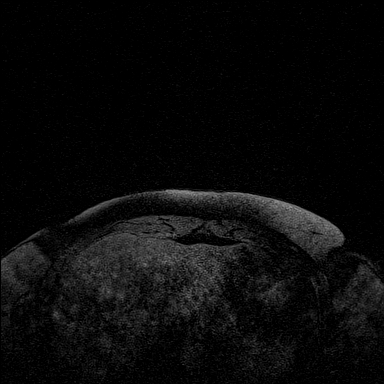
[im 36/144]
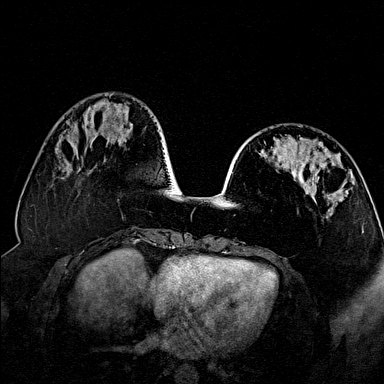
[im 72/144]
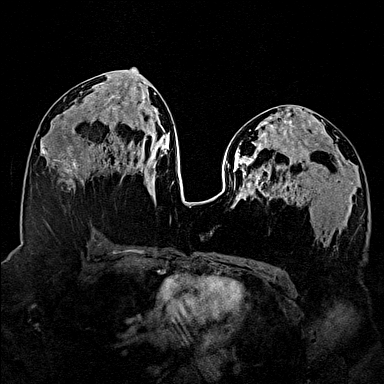
[im 108/144]
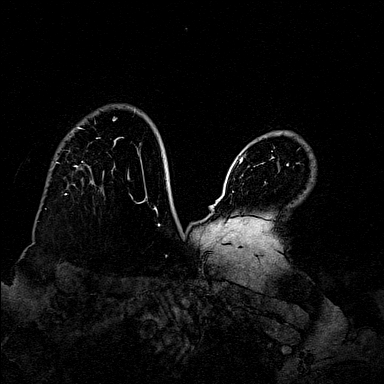
[im 144/144]
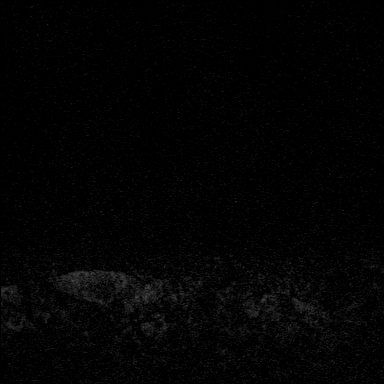

[Series 7: fl3d post-cm 20 · axial · 1.2mm · 0.94mm/px · z∈[-53,+118]mm · 5 of 144 slices shown (2 of 3)]
[im 1/144]
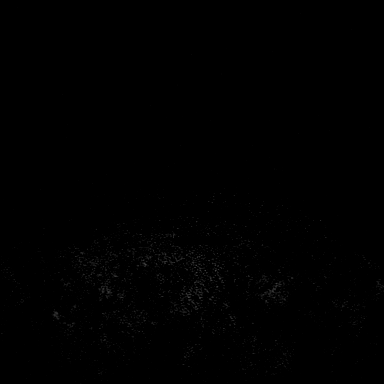
[im 36/144]
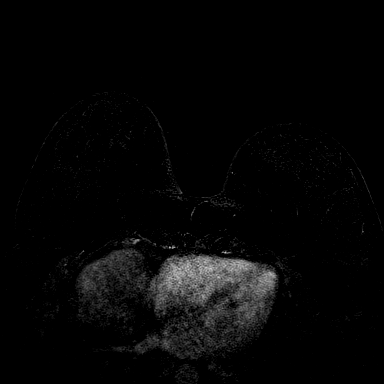
[im 72/144]
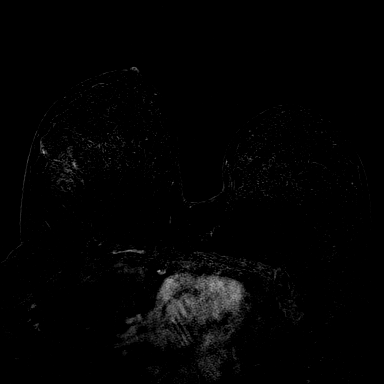
[im 108/144]
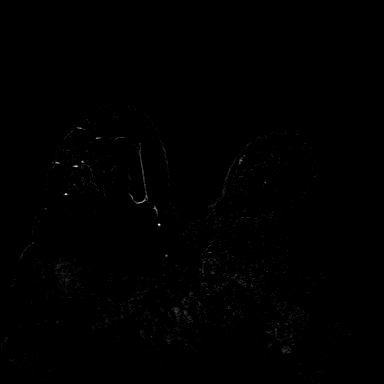
[im 144/144]
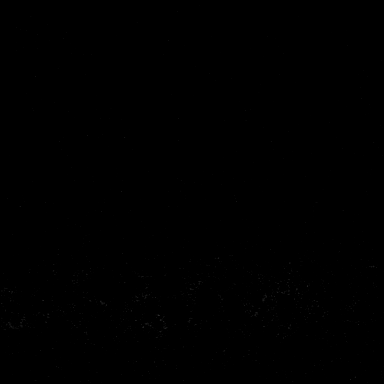

[Series 8: fl3d post-cm 20 · axial · 172.8mm · 0.94mm/px · 1 of 1 slices shown (3 of 3)]
[im 1/1]
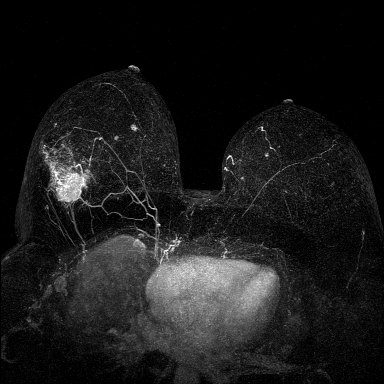

[Series 9: fl3d post-cm 3min · axial · 1.2mm · 0.94mm/px · z∈[-53,+118]mm · 5 of 144 slices shown]
[im 1/144]
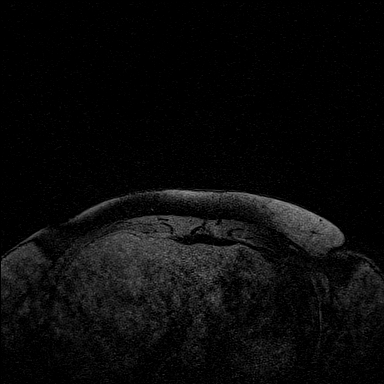
[im 36/144]
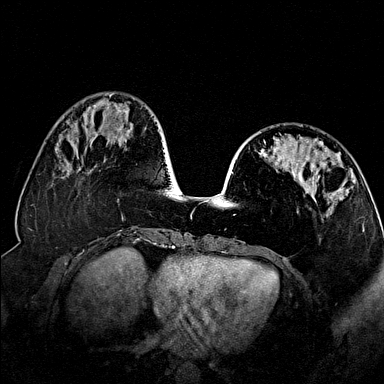
[im 72/144]
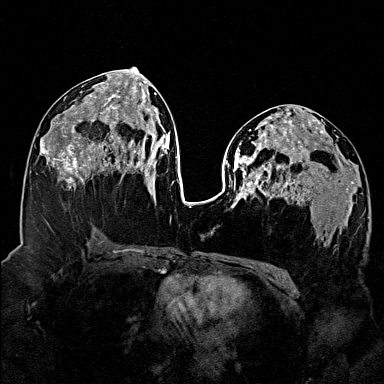
[im 108/144]
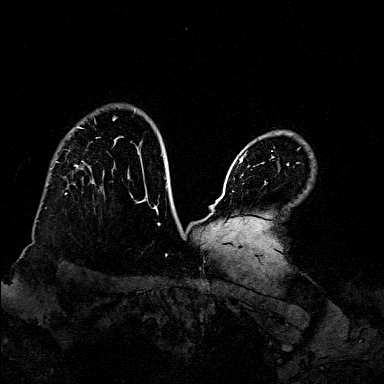
[im 144/144]
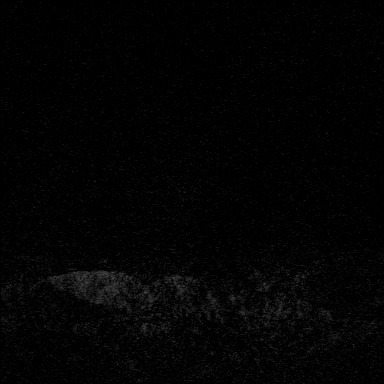

[31 of 48 positions shown; findings below may reference images not displayed]

THREE-DIMENSIONAL MR IMAGE RENDERING ON INDEPENDENT WORKSTATION:

Three-dimensional MR images were rendered by post-processing of the
original MR data on an independent workstation. The
three-dimensional MR images were interpreted, and findings are
reported in the following complete MRI report for this study. Three
dimensional images were evaluated at the independent [employer]
workstation
FINDINGS: Breast composition: c.  Heterogeneous fibroglandular tissue.

Background parenchymal enhancement: Moderate.

Right breast: Within upper-outer right breast middle to posterior
depth there is a 2.7 x 2.7 x 3.3 cm heterogeneously enhancing mass
compatible with recently biopsied right breast carcinoma. Extending
anterior and lateral to the mass is approximately 3 cm of non mass
enhancement. This may potentially represent post biopsy changes
and/or associated disease.

Within the upper inner right breast anterior depth there is an 8 x 8
x 8 mm irregular enhancing mass.

Within the lower inner right breast middle depth there is a 6 x 6 x
6 mm oval enhancing mass.

Left breast: Scattered foci of enhancement are demonstrated. No
suspicious enhancing mass.

Lymph nodes: There are multiple cortically thickened right axillary
lymph nodes. The most cortically thickened lymph node measures
cm.

Ancillary findings: Multiple T2 bright masses are demonstrated
throughout the liver measuring up to 10 mm.
IMPRESSION: 1. Irregular enhancing mass within the upper-outer right breast
compatible with biopsy-proven carcinoma. Surrounding non mass
enhancement potentially secondary to biopsy changes or associated
disease.
2. Indeterminate 8 mm irregular enhancing mass within the upper
inner right breast.
3. Indeterminate enhancing mass within the lower inner right breast.
4. Multiple cortically thickened right axillary lymph nodes. A
previous right axillary lymph node biopsy has been performed.
5. Multiple nonspecific T2 bright masses within the liver. These may
potentially represent cysts however are indeterminate on non
dedicated imaging.

RECOMMENDATION:
1. MRI guided core needle biopsy of the enhancing masses within the
upper inner and lower inner right breast.
2. Consider dedicated evaluation of the liver with pre and post
contrast enhanced MRI.

BI-RADS CATEGORY  4: Suspicious.

## 2016-03-16 ENCOUNTER — Ambulatory Visit (HOSPITAL_BASED_OUTPATIENT_CLINIC_OR_DEPARTMENT_OTHER): Payer: BC Managed Care – PPO | Admitting: Hematology and Oncology

## 2016-03-16 ENCOUNTER — Encounter: Payer: Self-pay | Admitting: Hematology and Oncology

## 2016-03-16 DIAGNOSIS — Z853 Personal history of malignant neoplasm of breast: Secondary | ICD-10-CM | POA: Diagnosis not present

## 2016-03-16 DIAGNOSIS — R635 Abnormal weight gain: Secondary | ICD-10-CM

## 2016-03-16 DIAGNOSIS — K5900 Constipation, unspecified: Secondary | ICD-10-CM

## 2016-03-16 DIAGNOSIS — Z171 Estrogen receptor negative status [ER-]: Principal | ICD-10-CM

## 2016-03-16 DIAGNOSIS — C50411 Malignant neoplasm of upper-outer quadrant of right female breast: Secondary | ICD-10-CM

## 2016-03-16 NOTE — Assessment & Plan Note (Signed)
Right breast mastectomy 06/17/2014: IDC, 3.6 cm, grade 3, 0/4 lymph nodes, T2 N0 M0 stage II a, ER 0%, PR 0%, HER-2 negative, Ki-67 90% Adjuvant chemotherapy with dose dense Adriamycin and Cytoxan 4 followed by Abraxane weekly 12 from 07/22/2014 to 12/02/2014  Breast Cancer Surveillance: 1. Breast exam 08/09/2015: Normal Breast exam on the left breast. Right chest wall scar without any abnormalities. 2. Mammogram : An ultrasound 02/16/2015: No mammographic or ultrasound evidence of malignancy within the left breast. (Patient had breast pain that led to the ultrasound evaluation)  Neuropathy: Off narcotic pain medications.  Return to clinic in 1 year for continued surveillance and follow-up.

## 2016-03-16 NOTE — Progress Notes (Signed)
Patient Care Team: Dorena Cookey, MD as PCP - General Nicholas Lose, MD as Consulting Physician (Hematology and Oncology) Donnie Mesa, MD as Consulting Physician (General Surgery) Sylvan Cheese, NP as Nurse Practitioner (Hematology and Oncology)  DIAGNOSIS:  Encounter Diagnosis  Name Primary?  . Malignant neoplasm of upper-outer quadrant of right breast in female, estrogen receptor negative ([place])     SUMMARY OF ONCOLOGIC HISTORY:   Breast cancer of upper-outer quadrant of right female breast ([place])   05/15/2014 Mammogram    Right breast: 2.5 cm mass noted at 10:00      05/15/2014 Breast US    Right breast: irregular hypoechoic mass at 10 o'clock,8 cm from the nipple measuring 2.6 x 1.4 x 2 cm. Right axillary LN with a mildly thickened cortex is measures 1.3 x 0.9 x 1 cm.      05/15/2014 Initial Biopsy    Right breast biopsy 10:00: Invasive ductal carcinoma grade 3, ER 0% PR 0% HER-2 negative, Ki-67 90%      05/20/2014 Breast MRI    Right breast mass: 2.7 x 2.7 x 3.3 cm along with 3 cm non-mass enhancement, upper inner quadrant 8 mm mass, lower inner quadrant 6 mm mass, right axillary lymph nodes 1.2 cm, T2 bright liver lesions      06/02/2014 Imaging    Abdominal MR: Multiple benign hepatic cysts.      06/02/2014 Clinical Stage    Stage IIA: T2 N0      06/03/2014 Procedure    Genetic testing: BreastNext panel Cephus Shelling) reveals no clinically significant variant at ATM, BARD1, BRCA1, BRCA2, BRIP1, CDH1, CHEK2, MRE11A, MUTYH, NBN, NF1, PALB2, PTEN, RAD50, RAD51C, RAD51D, and TP53      06/17/2014 Definitive Surgery    Right breast mastectomy: IDC, 3.6 cm, grade 3, 0/4 lymph nodes,  ER 0%, PR 0%, HER-2 negative, Ki-67 90%      06/17/2014 Pathologic Stage    Stage IIA: pT2 pN0      07/22/2014 - 12/02/2014 Adjuvant Chemotherapy    Adjuvant chemotherapy with dose dense Adriamycin and Cytoxan 4 followed by Abraxane weekly 12      12/31/2014 Survivorship    Survivorship  care plan completed and mailed to patient in lieu of in person visit.       CHIEF COMPLIANT: Follow-up of breast cancer complaining of weight gain  INTERVAL HISTORY: Misty Snyder is a 56 year old with above-mentioned history of right breast cancer treated with mastectomy followed by adjuvant chemotherapy. She is currently on surveillance. She reports that her energy levels have recovered back to normal. She does not have neuropathy anymore. She is very worried about weight gain issues. She thinks of the weight gain is related to constipation. It appears that she has a very hard time going for bowel movement in spite of using all the over-the-counter constipation medications. She started to see a gastroenterologist.  REVIEW OF SYSTEMS:   Constitutional: Denies fevers, chills or abnormal weight loss Eyes: Denies blurriness of vision Ears, nose, mouth, throat, and face: Denies mucositis or sore throat Respiratory: Denies cough, dyspnea or wheezes Cardiovascular: Denies palpitation, chest discomfort Gastrointestinal:  Denies nausea, heartburn or change in bowel habits Skin: Denies abnormal skin rashes Lymphatics: Denies new lymphadenopathy or easy bruising Neurological:Denies numbness, tingling or new weaknesses Behavioral/Psych: Mood is stable, no new changes  Extremities: No lower extremity edema Breast:  denies any pain or lumps or nodules in either breasts All other systems were reviewed with the patient and are negative.  I have reviewed the past medical history, past surgical history, social history and family history with the patient and they are unchanged from previous note.  ALLERGIES:  has No Known Allergies.  MEDICATIONS:  Current Outpatient Prescriptions  Medication Sig Dispense Refill  . benzonatate (TESSALON) 200 MG capsule Take 1 capsule (200 mg total) by mouth 3 (three) times daily as needed for cough. 20 capsule 1  . doxycycline (VIBRAMYCIN) 100 MG capsule Take 1  capsule (100 mg total) by mouth 2 (two) times daily. 20 capsule 0  . HYDROcodone-homatropine (HYCODAN) 5-1.5 MG/5ML syrup Take 5 mLs by mouth every 8 (eight) hours as needed for cough. 120 mL 0  . naproxen (NAPROSYN) 500 MG tablet Take 1 tablet (500 mg total) by mouth 2 (two) times daily with a meal. 60 tablet 1  . traZODone (DESYREL) 150 MG tablet Take 150 mg by mouth at bedtime.     No current facility-administered medications for this visit.     PHYSICAL EXAMINATION: ECOG PERFORMANCE STATUS: 1 - Symptomatic but completely ambulatory  Vitals:   03/16/16 1457  BP: 116/68  Pulse: 82  Resp: 18  Temp: 97 F (36.1 C)   Filed Weights   03/16/16 1457  Weight: 181 lb (82.1 kg)    GENERAL:alert, no distress and comfortable SKIN: skin color, texture, turgor are normal, no rashes or significant lesions EYES: normal, Conjunctiva are pink and non-injected, sclera clear OROPHARYNX:no exudate, no erythema and lips, buccal mucosa, and tongue normal  NECK: supple, thyroid normal size, non-tender, without nodularity LYMPH:  no palpable lymphadenopathy in the cervical, axillary or inguinal LUNGS: clear to auscultation and percussion with normal breathing effort HEART: regular rate & rhythm and no murmurs and no lower extremity edema ABDOMEN:abdomen soft, non-tender and normal bowel sounds MUSCULOSKELETAL:no cyanosis of digits and no clubbing  NEURO: alert & oriented x 3 with fluent speech, no focal motor/sensory deficits EXTREMITIES: No lower extremity edema BREAST: No palpable masses or nodules in either right or left breasts. No palpable axillary supraclavicular or infraclavicular adenopathy no breast tenderness or nipple discharge. (exam performed in the presence of a chaperone)  LABORATORY DATA:  I have reviewed the data as listed   Chemistry      Component Value Date/Time   NA 142 02/16/2016 0838   NA 142 12/02/2014 0757   K 3.8 02/16/2016 0838   K 3.6 12/02/2014 0757   CL 108  02/16/2016 0838   CO2 29 02/16/2016 0838   CO2 27 12/02/2014 0757   BUN 11 02/16/2016 0838   BUN 7.3 12/02/2014 0757   CREATININE 0.69 02/16/2016 0838   CREATININE 0.72 04/29/2015 1705   CREATININE 0.7 12/02/2014 0757      Component Value Date/Time   CALCIUM 9.0 02/16/2016 0838   CALCIUM 9.3 12/02/2014 0757   ALKPHOS 57 02/16/2016 0838   ALKPHOS 74 12/02/2014 0757   AST 15 02/16/2016 0838   AST 23 12/02/2014 0757   ALT 12 02/16/2016 0838   ALT 24 12/02/2014 0757   BILITOT 0.4 02/16/2016 0838   BILITOT <0.30 12/02/2014 0757       Lab Results  Component Value Date   WBC 3.7 (L) 02/16/2016   HGB 12.5 02/16/2016   HCT 38.3 02/16/2016   MCV 85.2 02/16/2016   PLT 243.0 02/16/2016   NEUTROABS 2.4 02/16/2016    ASSESSMENT & PLAN:  Breast cancer of upper-outer quadrant of right female breast Right breast mastectomy 06/17/2014: IDC, 3.6 cm, grade 3, 0/4 lymph nodes, T2 N0  M0 stage II a, ER 0%, PR 0%, HER-2 negative, Ki-67 90% Adjuvant chemotherapy with dose dense Adriamycin and Cytoxan 4 followed by Abraxane weekly 12 from 07/22/2014 to 12/02/2014  Breast Cancer Surveillance: 1. Breast exam 08/09/2015: Normal Breast exam on the left breast. Right chest wall scar without any abnormalities. 2. Mammogram : An ultrasound 02/16/2015: No mammographic or ultrasound evidence of malignancy within the left breast. (Patient had breast pain that led to the ultrasound evaluation)  Weight gain: I discussed the patient that done she may need to join [employer] or change her exercise regimen. Neuropathy: Off narcotic pain medications.  Return to clinic in 1 year for continued surveillance and follow-up.   I spent 25 minutes talking to the patient of which more than half was spent in counseling and coordination of care.  No orders of the defined types were placed in this encounter.  The patient has a good understanding of the overall plan. she agrees with it. she will call with  any problems that may develop before the next visit here.   Rulon Eisenmenger, MD 03/16/16

## 2016-03-17 ENCOUNTER — Encounter: Payer: Self-pay | Admitting: Gastroenterology

## 2016-04-04 ENCOUNTER — Encounter: Payer: BC Managed Care – PPO | Admitting: Family Medicine

## 2016-05-01 ENCOUNTER — Ambulatory Visit: Payer: BC Managed Care – PPO | Admitting: Gastroenterology

## 2016-05-15 ENCOUNTER — Ambulatory Visit: Payer: BC Managed Care – PPO | Admitting: Gastroenterology

## 2016-05-15 ENCOUNTER — Telehealth: Payer: Self-pay | Admitting: Gastroenterology

## 2016-05-15 NOTE — Telephone Encounter (Signed)
Do you want to charge? 

## 2016-05-15 NOTE — Telephone Encounter (Signed)
Yes charge 

## 2016-06-14 ENCOUNTER — Ambulatory Visit (INDEPENDENT_AMBULATORY_CARE_PROVIDER_SITE_OTHER): Payer: BC Managed Care – PPO | Admitting: Family Medicine

## 2016-06-14 ENCOUNTER — Other Ambulatory Visit (HOSPITAL_COMMUNITY)
Admission: RE | Admit: 2016-06-14 | Discharge: 2016-06-14 | Disposition: A | Discharge status: Home / Self Care | Payer: BC Managed Care – PPO | Source: Ambulatory Visit | Attending: Family Medicine | Admitting: Family Medicine

## 2016-06-14 ENCOUNTER — Encounter: Payer: Self-pay | Admitting: Family Medicine

## 2016-06-14 VITALS — BP 122/80 | Temp 98.5°F | Ht 62.5 in | Wt 180.5 lb

## 2016-06-14 DIAGNOSIS — Z171 Estrogen receptor negative status [ER-]: Secondary | ICD-10-CM

## 2016-06-14 DIAGNOSIS — G620 Drug-induced polyneuropathy: Secondary | ICD-10-CM

## 2016-06-14 DIAGNOSIS — T451X5A Adverse effect of antineoplastic and immunosuppressive drugs, initial encounter: Secondary | ICD-10-CM

## 2016-06-14 DIAGNOSIS — C50411 Malignant neoplasm of upper-outer quadrant of right female breast: Secondary | ICD-10-CM | POA: Diagnosis not present

## 2016-06-14 DIAGNOSIS — K224 Dyskinesia of esophagus: Secondary | ICD-10-CM

## 2016-06-14 DIAGNOSIS — Z01419 Encounter for gynecological examination (general) (routine) without abnormal findings: Secondary | ICD-10-CM | POA: Insufficient documentation

## 2016-06-14 DIAGNOSIS — Z0000 Encounter for general adult medical examination without abnormal findings: Secondary | ICD-10-CM | POA: Insufficient documentation

## 2016-06-14 DIAGNOSIS — G4701 Insomnia due to medical condition: Secondary | ICD-10-CM | POA: Diagnosis not present

## 2016-06-14 MED ORDER — GABAPENTIN 100 MG PO CAPS: 100.0000 mg | ORAL_CAPSULE | Freq: Three times a day (TID) | ORAL | 11 refills | Status: DC

## 2016-06-14 MED ORDER — TRAZODONE HCL 100 MG PO TABS
100.0000 mg | ORAL_TABLET | Freq: Every day | ORAL | 4 refills | Status: DC
Start: 2016-06-14 — End: 2017-06-27

## 2016-06-14 MED ORDER — LORAZEPAM 0.5 MG PO TABS: 0.5000 mg | ORAL_TABLET | Freq: Two times a day (BID) | ORAL | 5 refills | Status: DC | PRN

## 2016-06-14 NOTE — Progress Notes (Signed)
Misty Snyder is a 56 year old female nonsmoker who comes in today for general physical examination because a history of breast cancer  She had breast cancer couple years ago and had a right mastectomy. She's due for follow-up by her general surgeon. She opted not to have reconstruction.  She went to menopause 5 years ago around age 46. The hormonal therapy that she got post breast cancer surgery caused a lot of side effects. Her biggest side effects were depression and insomnia. She's been to see the folks over Dr. Arvil Persons office. They had her on trazodone but it didn't work. The also N Celexa. She's due to go back to see them in June.  She's also had side effects from the chemotherapy manifested by neuropathy of both lower extremities was causes sleep disturbance.  For the past 2 years she's had trouble swallowing it's gotten worse recently were food sticks in her upper esophagus. We'll put in a note to be seen by GI ASAP and put her on a soft diet  She also has difficulty with constipation and drinks fruit and lots of water and a stool softener.  Her sleep dysfunction is cold all full". Will retry the trazodone but up the dose.  She gets routine eye care, dental care, checks her left breast monthly and gets a mammogram on that breast yearly. Colonoscopy 2010 was normal.  Vaccinations up-to-date  Weight is 181 pounds obese class I. We discussed carbohydrate free diet  She works for the [employer] in [place]. 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  14 point review of systems reviewed and otherwise negative  BP 122/80 (BP Location: Left Arm)   Temp 98.5 F (36.9 C) (Oral)   Ht 5' 2.5" (1.588 m)   Wt 180 lb 8 oz (81.9 kg)   LMP 06/28/2011   BMI 32.49 kg/m  Examination of the HEENT were negative neck was supple thyroid is not enlarged lungs are clear to auscultation cardiac exam normal. Right breast surgically removed left  breast normal. Bowel exam negative pelvic examination external genitalia within normal limits vaginal vault was normal cervix fistulas Pap smear was done bimanual exam was negative rectal normal stool guaiac negative extremities normal skin no peripheral pulses normal except for numerous seborrheic keratoses and capillary hemangiomas  #1 healthy female  #2 status post right mastectomy......... 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 trial of Neurontin  Number for sleep dysfunction......... trazodone 75 mg.......Marland Kitchen Ativan 0.5 daily at bedtime........ follow-up by psychiatrist in June  #5 reflux esophagitis with symptoms of food getting stuck in her upper esophagus.......... soft diet GI consult ASAP  #6 constipation........... discussed diet exercise stool softener etc. [facility]  #7 overweight........ discuss continue exercise program daily which she does now adding carbohydrate free diet

## 2016-06-14 NOTE — Patient Instructions (Addendum)
We will get a GI consult ASAP because of your esophageal dysmotility.......... in the meantime soft diet......... no staker hotdogs......... chew your food drink lots of water and swallow slowly. If you feel things are getting worse before your GI consult go on a full liquid diet  Trazodone 100 mg..............Marland Kitchen 1 at bedtime for sleep dysfunction  Ativan 0.5..........Marland Kitchen 1 at bedtime along with trazodone until the trazodone becomes effective  Follow. Up with Dr. Arvil Persons group on this issue in June.  Return in one year for general physical exam sooner if any problems  Neurontin 100 mg..........Marland Kitchen 1 in the morning with food,,,,,,,,,,,, 2 weeks your evening female. This is for the neuropathy. If you don't see any improvement in 4-6 weeks call your oncologist for further advice

## 2016-06-16 LAB — CYTOLOGY - PAP
ADEQUACY: ABSENT
DIAGNOSIS: NEGATIVE
HPV: NOT DETECTED

## 2016-06-28 ENCOUNTER — Encounter: Payer: Self-pay | Admitting: Family Medicine

## 2016-06-29 ENCOUNTER — Encounter: Payer: Self-pay | Admitting: Gastroenterology

## 2016-08-04 ENCOUNTER — Encounter: Payer: Self-pay | Admitting: Gastroenterology

## 2016-08-04 ENCOUNTER — Ambulatory Visit (INDEPENDENT_AMBULATORY_CARE_PROVIDER_SITE_OTHER): Payer: BC Managed Care – PPO | Admitting: Gastroenterology

## 2016-08-04 VITALS — BP 104/60 | HR 104 | Ht 62.5 in | Wt 180.2 lb

## 2016-08-04 DIAGNOSIS — R6881 Early satiety: Secondary | ICD-10-CM | POA: Diagnosis not present

## 2016-08-04 DIAGNOSIS — K5900 Constipation, unspecified: Secondary | ICD-10-CM

## 2016-08-04 DIAGNOSIS — K219 Gastro-esophageal reflux disease without esophagitis: Secondary | ICD-10-CM

## 2016-08-04 MED ORDER — POLYETHYLENE GLYCOL 3350 17 GM/SCOOP PO POWD: ORAL | 3 refills | Status: DC

## 2016-08-04 MED ORDER — OMEPRAZOLE 40 MG PO CPDR: 40.0000 mg | DELAYED_RELEASE_CAPSULE | Freq: Every day | ORAL | 11 refills | Status: DC

## 2016-08-04 NOTE — Patient Instructions (Signed)
Stop Pepto bismol.  Start Miralax mix 17 grams in 8 oz of water or juice 1-3 x daily.   We have sent the following medications to your pharmacy for you to pick up at your convenience: omeprazole.   You have been scheduled for an abdominal ultrasound at [facility] (1st floor of hospital) on 08/09/16 at 8:27m. Please arrive 15 minutes prior to your appointment for registration. Make certain not to have anything to eat or drink 6 hours prior to your appointment. Should you need to reschedule your appointment, please contact radiology at [phone]. This test typically takes about 30 minutes to perform.  Normal BMI (Body Mass Index- based on height and weight) is between 19 and 25. Your BMI today is Body mass index is 32.43 kg/m. Marland Kitchen Please consider follow up  regarding your BMI with your Primary Care Provider.  Thank you for choosing me and [facility].  Pricilla Riffle. Dagoberto Ligas., MD., Marval Regal

## 2016-08-04 NOTE — Progress Notes (Signed)
History of Present Illness: This is a 56 year old female referred by Dorena Cookey, MD for the evaluation of early satiety, belching, bloating, constipation. She relates problems with intermittent constipation over the past 10 years. Symptoms have worsened over the past 1-2 years. She has early satiety and postprandial bloating. Symptoms are worse with larger meals. She has been taking Pepto-Bismol regularly to help the symptoms. Her constipation has clearly worsened on Pepto-Bismol. Blood work in January 2018 was unremarkable. MR of the abdomen with contrast in 05/2014 showed multiple benign hepatic cysts and no other abnormalities. Colonoscopy performed in December 2010 for constipation and hematochezia showed melanosis coli in the left colon and internal hemorrhoids, they were injected. Denies weight loss, abdominal pain, diarrhea, change in stool caliber, melena, hematochezia, nausea, vomiting, dysphagia, chest pain.   No Known Allergies Outpatient Medications Prior to Visit  Medication Sig Dispense Refill  . gabapentin (NEURONTIN) 100 MG capsule Take 1 capsule (100 mg total) by mouth 3 (three) times daily. 90 capsule 11  . traZODone (DESYREL) 100 MG tablet Take 1 tablet (100 mg total) by mouth at bedtime. 100 tablet 4  . LORazepam (ATIVAN) 0.5 MG tablet Take 1 tablet (0.5 mg total) by mouth 2 (two) times daily as needed for anxiety. (Patient not taking: Reported on 08/04/2016) 60 tablet 5  . naproxen (NAPROSYN) 500 MG tablet Take 1 tablet (500 mg total) by mouth 2 (two) times daily with a meal. (Patient not taking: Reported on 06/14/2016) 60 tablet 1   No facility-administered medications prior to visit.    Past Medical History:  Diagnosis Date  . Breast cancer ([place]) 05/15/14   right breast invasive ductal carcinoma  . Depression   . Malignant neoplasm of upper-outer quadrant of right female breast ([place])   . Uterine fibroid    Past Surgical History:  Procedure Laterality Date  .  INSERTION CENTRAL VENOUS ACCESS DEVICE W/ SUBCUTANEOUS PORT  06/18/2014  . MASTECTOMY W/ SENTINEL NODE BIOPSY Right 06/18/2014   WITH PORT A CATH PLACEMENT  . MASTECTOMY W/ SENTINEL NODE BIOPSY Right 06/17/2014   Procedure: RIGHT MASTECTOMY WITH SENTINEL LYMPH NODE BIOPSY WITH BLUE DYE MAPPING AND PORT PLACEMENT;  Surgeon: Donnie Mesa, MD;  Location: [facility];  Service: General;  Laterality: Right;  . NECK SURGERY    . PORTACATH PLACEMENT Left 06/17/2014   Procedure: INSERTION PORT-A-CATH;  Surgeon: Donnie Mesa, MD;  Location: [facility] OR;  Service: General;  Laterality: Left;   Social History   Social History  . Marital status: Single    Spouse name: N/A  . Number of children: N/A  . Years of education: N/A   Social History Main Topics  . Smoking status: Never Smoker  . Smokeless tobacco: Never Used  . Alcohol use No  . Drug use: No  . Sexual activity: Not Asked   Other Topics Concern  . None   Social History Narrative  . None   Family History  Problem Relation Age of Onset  . Alcohol abuse Other   . Prostate cancer Maternal Grandfather        Dx 72s  . Esophageal cancer Neg Hx   . Colon cancer Neg Hx   . Pancreatic cancer Neg Hx   . Stomach cancer Neg Hx   . Liver disease Neg Hx       Review of Systems: Pertinent positive and negative review of systems were noted in the above HPI section. All other review of systems were otherwise negative.  Physical Exam: General: Well developed, well nourished, no acute distress Head: Normocephalic and atraumatic Eyes:  sclerae anicteric, EOMI Ears: Normal auditory acuity Mouth: No deformity or lesions Neck: Supple, no masses or thyromegaly Lungs: Clear throughout to auscultation Heart: Regular rate and rhythm; no murmurs, rubs or bruits Abdomen: Soft, non tender and non distended. No masses, hepatosplenomegaly or hernias noted. Normal Bowel sounds Musculoskeletal: Symmetrical with no gross deformities  Skin: No lesions on visible  extremities Pulses:  Normal pulses noted Extremities: No clubbing, cyanosis, edema or deformities noted Neurological: Alert oriented x 4, grossly nonfocal Cervical Nodes:  No significant cervical adenopathy Inguinal Nodes: No significant inguinal adenopathy Psychological:  Alert and cooperative. Normal mood and affect  Assessment and Recommendations:  1. Early satiety, abdominal bloating. Suspected GERD and constipation. Start omeprazole 40 mg daily and follow antireflux measures. Schedule abd [place]. EGD if symptoms do not resolve. REV in 1 month.   2. Constipation. DC PeptoBismol. Begin MiraLAX 1-3 times daily titrated for 1 complete bowel movement daily. If this is not effective will start Linzess or Trulance.  3. CRC screening, average risk. 10 year interval colonoscopy is due in December 2020.   cc: Dorena Cookey, MD [address] [place], [place] [address]

## 2016-08-09 ENCOUNTER — Ambulatory Visit (HOSPITAL_COMMUNITY): Admission: RE | Admit: 2016-08-09 | Payer: BC Managed Care – PPO | Source: Ambulatory Visit

## 2016-10-06 ENCOUNTER — Encounter: Payer: Self-pay | Admitting: Family Medicine

## 2017-01-24 ENCOUNTER — Other Ambulatory Visit: Payer: Self-pay | Admitting: Family Medicine

## 2017-01-24 DIAGNOSIS — Z1231 Encounter for screening mammogram for malignant neoplasm of breast: Secondary | ICD-10-CM

## 2017-01-25 ENCOUNTER — Other Ambulatory Visit: Payer: Self-pay | Admitting: Family Medicine

## 2017-01-29 ENCOUNTER — Telehealth: Payer: Self-pay

## 2017-01-29 ENCOUNTER — Other Ambulatory Visit: Payer: Self-pay

## 2017-01-29 NOTE — Telephone Encounter (Signed)
Rx for Lorazepam was phoned in to requested pharmacy.

## 2017-02-19 ENCOUNTER — Ambulatory Visit
Admission: RE | Admit: 2017-02-19 | Discharge: 2017-02-19 | Disposition: A | Discharge status: Home / Self Care | Payer: BC Managed Care – PPO | Source: Ambulatory Visit | Attending: Family Medicine | Admitting: Family Medicine

## 2017-02-19 DIAGNOSIS — Z1231 Encounter for screening mammogram for malignant neoplasm of breast: Secondary | ICD-10-CM

## 2017-03-07 ENCOUNTER — Telehealth: Payer: Self-pay | Admitting: Hematology and Oncology

## 2017-03-07 NOTE — Telephone Encounter (Signed)
Returned call to patient to confirm appointment for 3/4.

## 2017-03-19 ENCOUNTER — Inpatient Hospital Stay: Payer: BC Managed Care – PPO | Attending: Hematology and Oncology | Admitting: Hematology and Oncology

## 2017-03-19 ENCOUNTER — Telehealth: Payer: Self-pay | Admitting: Hematology and Oncology

## 2017-03-19 DIAGNOSIS — Z923 Personal history of irradiation: Secondary | ICD-10-CM | POA: Diagnosis not present

## 2017-03-19 DIAGNOSIS — Z9221 Personal history of antineoplastic chemotherapy: Secondary | ICD-10-CM | POA: Insufficient documentation

## 2017-03-19 DIAGNOSIS — Z171 Estrogen receptor negative status [ER-]: Secondary | ICD-10-CM | POA: Diagnosis not present

## 2017-03-19 DIAGNOSIS — Z853 Personal history of malignant neoplasm of breast: Secondary | ICD-10-CM | POA: Diagnosis not present

## 2017-03-19 DIAGNOSIS — C50411 Malignant neoplasm of upper-outer quadrant of right female breast: Secondary | ICD-10-CM

## 2017-03-19 NOTE — Assessment & Plan Note (Signed)
Right breast mastectomy 06/17/2014: IDC, 3.6 cm, grade 3, 0/4 lymph nodes, T2 N0 M0 stage II a, ER 0%, PR 0%, HER-2 negative, Ki-67 90% Adjuvant chemotherapy with dose dense Adriamycin and Cytoxan 4 followed by Abraxane weekly 12 from 07/22/2014 to 12/02/2014  Breast Cancer Surveillance: 1. Breast exam 03/19/2017: Normal Breast exam on the left breast. Right chest wall scar without any abnormalities. 2. Mammogram : 02/19/2017: No mammographic or ultrasound evidence of malignancy within the left breast.  Breast density category C  Weight gain: I discussed the patient that done she may need to join Weight Watchers or change her exercise regimen. Neuropathy: Off narcotic pain medications.  Return to clinic in 1 year for continued surveillance and follow-up.

## 2017-03-19 NOTE — Telephone Encounter (Signed)
Gave patient AVs and calendar of upcoming march 2020 appointments.  °

## 2017-03-19 NOTE — Progress Notes (Signed)
Patient Care Team: Dorena Cookey, MD as PCP - General Nicholas Lose, MD as Consulting Physician (Hematology and Oncology) Donnie Mesa, MD as Consulting Physician (General Surgery) Sylvan Cheese, NP as Nurse Practitioner (Hematology and Oncology)  DIAGNOSIS:  Encounter Diagnosis  Name Primary?  . Malignant neoplasm of upper-outer quadrant of right breast in female, estrogen receptor negative ([place])     SUMMARY OF ONCOLOGIC HISTORY:   Breast cancer of upper-outer quadrant of right female breast ([place])   05/15/2014 Mammogram    Right breast: 2.5 cm mass noted at 10:00      05/15/2014 Breast US    Right breast: irregular hypoechoic mass at 10 o'clock,8 cm from the nipple measuring 2.6 x 1.4 x 2 cm. Right axillary LN with a mildly thickened cortex is measures 1.3 x 0.9 x 1 cm.      05/15/2014 Initial Biopsy    Right breast biopsy 10:00: Invasive ductal carcinoma grade 3, ER 0% PR 0% HER-2 negative, Ki-67 90%      05/20/2014 Breast MRI    Right breast mass: 2.7 x 2.7 x 3.3 cm along with 3 cm non-mass enhancement, upper inner quadrant 8 mm mass, lower inner quadrant 6 mm mass, right axillary lymph nodes 1.2 cm, T2 bright liver lesions      06/02/2014 Imaging    Abdominal MR: Multiple benign hepatic cysts.      06/02/2014 Clinical Stage    Stage IIA: T2 N0      06/03/2014 Procedure    Genetic testing: BreastNext panel Cephus Shelling) reveals no clinically significant variant at ATM, BARD1, BRCA1, BRCA2, BRIP1, CDH1, CHEK2, MRE11A, MUTYH, NBN, NF1, PALB2, PTEN, RAD50, RAD51C, RAD51D, and TP53      06/17/2014 Definitive Surgery    Right breast mastectomy: IDC, 3.6 cm, grade 3, 0/4 lymph nodes,  ER 0%, PR 0%, HER-2 negative, Ki-67 90%      06/17/2014 Pathologic Stage    Stage IIA: pT2 pN0      07/22/2014 - 12/02/2014 Adjuvant Chemotherapy    Adjuvant chemotherapy with dose dense Adriamycin and Cytoxan 4 followed by Abraxane weekly 12      12/31/2014 Survivorship   Survivorship care plan completed and mailed to patient in lieu of in person visit.       CHIEF COMPLIANT: Surveillance of breast cancer  INTERVAL HISTORY: Misty Snyder is a 63-year with above-mentioned history of right breast cancer who underwent mastectomy followed by adjuvant chemotherapy and is currently in surveillance.  She denies any pain lumps or nodules.  Mammogram was done in February which was normal.  REVIEW OF SYSTEMS:   Constitutional: Denies fevers, chills or abnormal weight loss Eyes: Denies blurriness of vision Ears, nose, mouth, throat, and face: Denies mucositis or sore throat Respiratory: Denies cough, dyspnea or wheezes Cardiovascular: Denies palpitation, chest discomfort Gastrointestinal:  Denies nausea, heartburn or change in bowel habits Skin: Denies abnormal skin rashes Lymphatics: Denies new lymphadenopathy or easy bruising Neurological:Denies numbness, tingling or new weaknesses Behavioral/Psych: Mood is stable, no new changes  Extremities: No lower extremity edema Breast: Right mastectomy All other systems were reviewed with the patient and are negative.  I have reviewed the past medical history, past surgical history, social history and family history with the patient and they are unchanged from previous note.  ALLERGIES:  has No Known Allergies.  MEDICATIONS:  Current Outpatient Medications  Medication Sig Dispense Refill  . gabapentin (NEURONTIN) 100 MG capsule Take 1 capsule (100 mg total) by mouth 3 (three)  times daily. 90 capsule 11  . LORazepam (ATIVAN) 0.5 MG tablet TAKE 1 TABLET BY MOUTH TWICE A DAY AS NEEDED FOR ANXIETY 60 tablet 3  . naproxen (NAPROSYN) 500 MG tablet Take 1 tablet (500 mg total) by mouth 2 (two) times daily with a meal. (Patient not taking: Reported on 06/14/2016) 60 tablet 1  . omeprazole (PRILOSEC) 40 MG capsule Take 1 capsule (40 mg total) by mouth daily. 30 capsule 11  . polyethylene glycol powder (GLYCOLAX/MIRALAX)  powder Mix 17 g in 8 oz of water 1-3 x daily 255 g 3  . traZODone (DESYREL) 100 MG tablet Take 1 tablet (100 mg total) by mouth at bedtime. 100 tablet 4   No current facility-administered medications for this visit.     PHYSICAL EXAMINATION: ECOG PERFORMANCE STATUS: 1 - Symptomatic but completely ambulatory  Vitals:   03/19/17 1457  BP: 138/88  Pulse: 80  Resp: 18  Temp: (!) 97.5 F (36.4 C)  SpO2: 98%   Filed Weights   03/19/17 1457  Weight: 183 lb 12.8 oz (83.4 kg)    GENERAL:alert, no distress and comfortable SKIN: skin color, texture, turgor are normal, no rashes or significant lesions EYES: normal, Conjunctiva are pink and non-injected, sclera clear OROPHARYNX:no exudate, no erythema and lips, buccal mucosa, and tongue normal  NECK: supple, thyroid normal size, non-tender, without nodularity LYMPH:  no palpable lymphadenopathy in the cervical, axillary or inguinal LUNGS: clear to auscultation and percussion with normal breathing effort HEART: regular rate & rhythm and no murmurs and no lower extremity edema ABDOMEN:abdomen soft, non-tender and normal bowel sounds MUSCULOSKELETAL:no cyanosis of digits and no clubbing  NEURO: alert & oriented x 3 with fluent speech, no focal motor/sensory deficits EXTREMITIES: No lower extremity edema BREAST: No palpable lumps or nodules in the right chest wall or axilla.  No lumps or nodules in the left breast.  (exam performed in the presence of a chaperone)  LABORATORY DATA:  I have reviewed the data as listed CMP Latest Ref Rng & Units 02/16/2016 04/29/2015 12/02/2014  Glucose 70 - 99 mg/dL 99 90 106  BUN 6 - 23 mg/dL 11 9 7.3  Creatinine 0.40 - 1.20 mg/dL 0.69 0.72 0.7  Sodium 135 - 145 mEq/L 142 142 142  Potassium 3.5 - 5.1 mEq/L 3.8 4.0 3.6  Chloride 96 - 112 mEq/L 108 105 -  CO2 19 - 32 mEq/L _0 Calcium 8.4 - 10.5 mg/dL 9.0 9.7 9.3  Total Protein 6.0 - 8.3 g/dL 7.0 - 6.9  Total Bilirubin 0.2 - 1.2 mg/dL 0.4 - <0.30    Alkaline Phos 39 - 117 U/L 57 - 74  AST 0 - 37 U/L 15 - 23  ALT 0 - 35 U/L 12 - 24    Lab Results  Component Value Date   WBC 3.7 (L) 02/16/2016   HGB 12.5 02/16/2016   HCT 38.3 02/16/2016   MCV 85.2 02/16/2016   PLT 243.0 02/16/2016   NEUTROABS 2.4 02/16/2016    ASSESSMENT & PLAN:  Breast cancer of upper-outer quadrant of right female breast Right breast mastectomy 06/17/2014: IDC, 3.6 cm, grade 3, 0/4 lymph nodes, T2 N0 M0 stage II a, ER 0%, PR 0%, HER-2 negative, Ki-67 90% Adjuvant chemotherapy with dose dense Adriamycin and Cytoxan 4 followed by Abraxane weekly 12 from 07/22/2014 to 12/02/2014  Breast Cancer Surveillance: 1. Breast exam 03/19/2017: Normal Breast exam on the left breast. Right chest wall scar without any abnormalities. 2. Mammogram : 02/19/2017: No  mammographic or ultrasound evidence of malignancy within the left breast.  Breast density category C  Weight gain: I discussed the patient that done she may need to join Weight Watchers or change her exercise regimen. Neuropathy: Off narcotic pain medications.  Currently takes gabapentin. Insomnia: Takes trazodone and Ativan.  Return to clinic in 1 year for continued surveillance and follow-up.  I spent 25 minutes talking to the patient of which more than half was spent in counseling and coordination of care.  No orders of the defined types were placed in this encounter.  The patient has a good understanding of the overall plan. she agrees with it. she will call with any problems that may develop before the next visit here.   Harriette Ohara, MD 03/19/17

## 2017-06-07 ENCOUNTER — Other Ambulatory Visit: Payer: Self-pay | Admitting: Family Medicine

## 2017-06-21 ENCOUNTER — Telehealth: Payer: Self-pay | Admitting: *Deleted

## 2017-06-21 NOTE — Telephone Encounter (Signed)
Call received from [place] to "speak with my navigator, Bary Castilla".  Call transferred to ext. [phone] as requested to help with this request.

## 2017-06-27 ENCOUNTER — Other Ambulatory Visit: Payer: Self-pay | Admitting: Family Medicine

## 2017-08-20 ENCOUNTER — Other Ambulatory Visit: Payer: Self-pay | Admitting: Family Medicine

## 2017-08-21 ENCOUNTER — Telehealth: Payer: Self-pay | Admitting: Family Medicine

## 2017-08-21 NOTE — Telephone Encounter (Signed)
Pt sees Dr Sherren Mocha, LOV was 06/14/2016 and last refill was 01/29/2017 for 60 tablets with 3 refills, Pt is scheduled for an OV on 09/18/2017 with Dr Sherren Mocha, pt request for 30 days refill before her appointment. Please Advise

## 2017-08-21 NOTE — Telephone Encounter (Unsigned)
Copied from [place]. Topic: Quick Communication - Rx Refill/Question >> Aug 21, 2017  8:23 AM Judyann Munson wrote: Medication:LORazepam (ATIVAN) 0.5 MG tablet, gabapentin (NEURONTIN) 100 MG capsule      Has the patient contacted their pharmacy?no  Preferred Pharmacy (with phone number or street name): [id number] [name], [place] [phone] (Phone) [phone] (Fax)

## 2017-08-22 NOTE — Telephone Encounter (Signed)
Rx was approved by Dr Raliegh Ip and phoned- in to pt pharmacy# [phone] with no refills, left message on pt voicemail

## 2017-08-24 ENCOUNTER — Other Ambulatory Visit: Payer: Self-pay

## 2017-08-24 MED ORDER — GABAPENTIN 100 MG PO CAPS: ORAL_CAPSULE | ORAL | 0 refills | Status: DC

## 2017-08-24 NOTE — Telephone Encounter (Signed)
Rx was called in to pt pharmacy, left a message on pt voicemail.

## 2017-09-15 ENCOUNTER — Other Ambulatory Visit: Payer: Self-pay | Admitting: Family Medicine

## 2017-09-18 ENCOUNTER — Ambulatory Visit: Payer: BC Managed Care – PPO | Admitting: Family Medicine

## 2017-09-24 ENCOUNTER — Ambulatory Visit: Payer: BC Managed Care – PPO | Admitting: Family Medicine

## 2017-09-24 ENCOUNTER — Encounter: Payer: Self-pay | Admitting: Family Medicine

## 2017-09-24 VITALS — BP 122/80 | HR 77 | Temp 98.3°F | Wt 175.4 lb

## 2017-09-24 DIAGNOSIS — K224 Dyskinesia of esophagus: Secondary | ICD-10-CM | POA: Diagnosis not present

## 2017-09-24 DIAGNOSIS — G620 Drug-induced polyneuropathy: Secondary | ICD-10-CM

## 2017-09-24 DIAGNOSIS — Z0000 Encounter for general adult medical examination without abnormal findings: Secondary | ICD-10-CM | POA: Diagnosis not present

## 2017-09-24 DIAGNOSIS — G4701 Insomnia due to medical condition: Secondary | ICD-10-CM | POA: Diagnosis not present

## 2017-09-24 DIAGNOSIS — C50411 Malignant neoplasm of upper-outer quadrant of right female breast: Secondary | ICD-10-CM

## 2017-09-24 DIAGNOSIS — T451X5A Adverse effect of antineoplastic and immunosuppressive drugs, initial encounter: Secondary | ICD-10-CM

## 2017-09-24 MED ORDER — CYCLOBENZAPRINE HCL 5 MG PO TABS: 5.0000 mg | ORAL_TABLET | ORAL | 2 refills | Status: AC

## 2017-09-24 MED ORDER — OMEPRAZOLE 40 MG PO CPDR: DELAYED_RELEASE_CAPSULE | ORAL | 4 refills | Status: DC

## 2017-09-24 MED ORDER — TRAZODONE HCL 50 MG PO TABS: 25.0000 mg | ORAL_TABLET | Freq: Every evening | ORAL | 3 refills | Status: DC | PRN

## 2017-09-24 MED ORDER — LORAZEPAM 0.5 MG PO TABS: ORAL_TABLET | ORAL | 0 refills | Status: DC

## 2017-09-24 MED ORDER — NAPROXEN 500 MG PO TABS: 500.0000 mg | ORAL_TABLET | Freq: Two times a day (BID) | ORAL | 5 refills | Status: DC

## 2017-09-24 MED ORDER — TRAZODONE HCL 100 MG PO TABS: 100.0000 mg | ORAL_TABLET | Freq: Every day | ORAL | 4 refills | Status: DC

## 2017-09-24 MED ORDER — GABAPENTIN 100 MG PO CAPS: ORAL_CAPSULE | ORAL | 4 refills | Status: DC

## 2017-09-24 NOTE — Patient Instructions (Signed)
Continue current medications except increase the trazodone to 150 mg at bedtime to help with the sleep dysfunction.  Hopefully by doing this she will not need the Ativan  Mechanical measures to prevent reflux........... Chew your food slowly and drink lots of water........ Nothing to eat or drink for 3 hours before bedtime.......... Sleep on 2 pillows...Marland KitchenMarland KitchenMarland Kitchen Avoid caffeine and peppermint.............Marland Kitchen Alcohol and nicotine are not an issue because you don't drink or smoke  In the future I would recommend Dr. Volanda Napoleon or Dr. Wonda Horner

## 2017-09-24 NOTE — Progress Notes (Signed)
Misty Snyder is a delightful 57 year old female who comes in today for evaluation of multiple issues  In 2016 she was diagnosed to have breast cancer. She underwent lumpectomy postop radiation and chemotherapy.  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 We discussed increasing the trazodone to try to see if we can come off the Ativan  She takes Naprosyn 500 mg twice a day because of osteoarthritis.  She has a history of reflux esophagitis. She's been discussed see GI. She had a colonoscopy in 2010 which was normal. She says the medication sometimes helps sometimes it doesn't. We discussed all the mechanical treatments which she will now try which includes not eating or drinking for 3 hours prior to bedtime etc. Etc.  She also takes Flexeril 10 mg 2 or 3 times a month because of muscle cramps.  She sees her oncologist every year for follow-up they do all her blood work.  She gets routine eye care, dental care, colonoscopy 2000 tenderness noted above  Mammogram 2019 February normal.  Vaccinations up-to-date she gets her seasonal flu shot at work.  Vs BP 122/80 (BP Location: Right Arm, Patient Position: Sitting, Cuff Size: Normal)   Pulse 77   Temp 98.3 F (36.8 C) (Oral)   Wt 175 lb 6 oz (79.5 kg)   LMP 06/28/2011   SpO2 96%   BMI 31.57 kg/m  Well-developed well-nourished female no acute distress vital signs stable she's afebrile HEENT were negative neck was supple thyroid is nonenlarged cardiopulmonary exam normal abdominal exam normal  #1 status post right breast cancer......2016...... Lumpectomy postop radiation and chemotherapy  #2 chemotherapy-induced neuropathy.......... Continue gabapentin 100 mg 3 times a day  #3 sleep dysfunction increase trazodone to 150 mg daily........ Goal of eliminating the Ativan  #4 reflux  esophagitis......... Continue Prilosec.......Marland Kitchen Discussed mechanical measures as outlined  #5 muscle cramps........ Flexeril when necessary  #6 osteoarthritis........... Naprosyn 500 twice a day

## 2017-11-03 ENCOUNTER — Other Ambulatory Visit: Payer: Self-pay | Admitting: Family Medicine

## 2017-11-28 ENCOUNTER — Encounter: Payer: BC Managed Care – PPO | Admitting: Family Medicine

## 2018-01-29 ENCOUNTER — Other Ambulatory Visit: Payer: Self-pay | Admitting: Hematology and Oncology

## 2018-01-29 DIAGNOSIS — Z1231 Encounter for screening mammogram for malignant neoplasm of breast: Secondary | ICD-10-CM

## 2018-02-28 ENCOUNTER — Ambulatory Visit
Admission: RE | Admit: 2018-02-28 | Discharge: 2018-02-28 | Disposition: A | Discharge status: Home / Self Care | Payer: BC Managed Care – PPO | Source: Ambulatory Visit | Attending: Hematology and Oncology | Admitting: Hematology and Oncology

## 2018-02-28 DIAGNOSIS — Z1231 Encounter for screening mammogram for malignant neoplasm of breast: Secondary | ICD-10-CM

## 2018-02-28 HISTORY — DX: Personal history of antineoplastic chemotherapy: Z92.21

## 2018-03-18 ENCOUNTER — Inpatient Hospital Stay: Payer: BC Managed Care – PPO | Attending: Hematology and Oncology | Admitting: Hematology and Oncology

## 2018-03-18 DIAGNOSIS — Z9221 Personal history of antineoplastic chemotherapy: Secondary | ICD-10-CM | POA: Diagnosis not present

## 2018-03-18 DIAGNOSIS — Z171 Estrogen receptor negative status [ER-]: Secondary | ICD-10-CM

## 2018-03-18 DIAGNOSIS — Z9011 Acquired absence of right breast and nipple: Secondary | ICD-10-CM | POA: Insufficient documentation

## 2018-03-18 DIAGNOSIS — C50411 Malignant neoplasm of upper-outer quadrant of right female breast: Secondary | ICD-10-CM

## 2018-03-18 DIAGNOSIS — Z853 Personal history of malignant neoplasm of breast: Secondary | ICD-10-CM | POA: Insufficient documentation

## 2018-03-18 NOTE — Assessment & Plan Note (Signed)
Right breast mastectomy 06/17/2014: IDC, 3.6 cm, grade 3, 0/4 lymph nodes, T2 N0 M0 stage II a, ER 0%, PR 0%, HER-2 negative, Ki-67 90% Adjuvant chemotherapy with dose dense Adriamycin and Cytoxan 4 followed by Abraxane weekly 12 from 07/22/2014 to 12/02/2014  Breast Cancer Surveillance: 1. Breast exam  03/18/2018: Normal Breast exam on the left breast. Right chest wall scar without any abnormalities. 2. Mammogram :  02/28/2018: No mammographic or ultrasound evidence of malignancy within the left breast.  Breast density category C  Return to clinic in 1 year for follow-up

## 2018-03-18 NOTE — Progress Notes (Signed)
Patient Care Team: Dorena Cookey, MD as PCP - General Nicholas Lose, MD as Consulting Physician (Hematology and Oncology) Donnie Mesa, MD as Consulting Physician (General Surgery) Sylvan Cheese, NP as Nurse Practitioner (Hematology and Oncology)  DIAGNOSIS:  Encounter Diagnosis  Name Primary?  . Malignant neoplasm of upper-outer quadrant of right breast in female, estrogen receptor negative ([place])     SUMMARY OF ONCOLOGIC HISTORY:   Breast cancer of upper-outer quadrant of right female breast ([place])   05/15/2014 Mammogram    Right breast: 2.5 cm mass noted at 10:00    05/15/2014 Breast US    Right breast: irregular hypoechoic mass at 10 o'clock,8 cm from the nipple measuring 2.6 x 1.4 x 2 cm. Right axillary LN with a mildly thickened cortex is measures 1.3 x 0.9 x 1 cm.    05/15/2014 Initial Biopsy    Right breast biopsy 10:00: Invasive ductal carcinoma grade 3, ER 0% PR 0% HER-2 negative, Ki-67 90%    05/20/2014 Breast MRI    Right breast mass: 2.7 x 2.7 x 3.3 cm along with 3 cm non-mass enhancement, upper inner quadrant 8 mm mass, lower inner quadrant 6 mm mass, right axillary lymph nodes 1.2 cm, T2 bright liver lesions    06/02/2014 Imaging    Abdominal MR: Multiple benign hepatic cysts.    06/02/2014 Clinical Stage    Stage IIA: T2 N0    06/03/2014 Procedure    Genetic testing: BreastNext panel Cephus Shelling) reveals no clinically significant variant at ATM, BARD1, BRCA1, BRCA2, BRIP1, CDH1, CHEK2, MRE11A, MUTYH, NBN, NF1, PALB2, PTEN, RAD50, RAD51C, RAD51D, and TP53    06/17/2014 Definitive Surgery    Right breast mastectomy: IDC, 3.6 cm, grade 3, 0/4 lymph nodes,  ER 0%, PR 0%, HER-2 negative, Ki-67 90%    06/17/2014 Pathologic Stage    Stage IIA: pT2 pN0    07/22/2014 - 12/02/2014 Adjuvant Chemotherapy    Adjuvant chemotherapy with dose dense Adriamycin and Cytoxan 4 followed by Abraxane weekly 12    12/31/2014 Survivorship    Survivorship care plan completed and  mailed to patient in lieu of in person visit.     CHIEF COMPLIANT: Surveillance of breast cancer  INTERVAL HISTORY: Misty Snyder is a 58 year old with above-mentioned history of right breast cancer who underwent surgery followed by adjuvant chemotherapy and is currently on surveillance.  She does not report any pain or discomfort.  Denies any lumps or nodules in the left breast.  She had right mastectomy previously.  REVIEW OF SYSTEMS:   Constitutional: Denies fevers, chills or abnormal weight loss Eyes: Denies blurriness of vision Ears, nose, mouth, throat, and face: Denies mucositis or sore throat Respiratory: Denies cough, dyspnea or wheezes Cardiovascular: Denies palpitation, chest discomfort Gastrointestinal:  Denies nausea, heartburn or change in bowel habits Skin: Denies abnormal skin rashes Lymphatics: Denies new lymphadenopathy or easy bruising Neurological:Denies numbness, tingling or new weaknesses Behavioral/Psych: Mood is stable, no new changes  Extremities: No lower extremity edema Breast: Right mastectomy previously, no palpable lumps or nodules in the left breast All other systems were reviewed with the patient and are negative.  I have reviewed the past medical history, past surgical history, social history and family history with the patient and they are unchanged from previous note.  ALLERGIES:  has No Known Allergies.  MEDICATIONS:  Current Outpatient Medications  Medication Sig Dispense Refill  . gabapentin (NEURONTIN) 100 MG capsule TAKE 1 CAPSULE BY MOUTH THREE TIMES A DAY 270 capsule 4  .  LORazepam (ATIVAN) 0.5 MG tablet TAKE 1 TABLET BY MOUTH AT BEDTIME 60 tablet 0  . naproxen (NAPROSYN) 500 MG tablet Take 1 tablet (500 mg total) by mouth 2 (two) times daily with a meal. 120 tablet 5  . omeprazole (PRILOSEC) 40 MG capsule TAKE 1 CAPSULE BY MOUTH EVERY DAY 100 capsule 4  . polyethylene glycol powder (GLYCOLAX/MIRALAX) powder Mix 17 g in 8 oz of water  1-3 x daily 255 g 3  . traZODone (DESYREL) 100 MG tablet Take 1 tablet (100 mg total) by mouth at bedtime. 100 tablet 4  . traZODone (DESYREL) 50 MG tablet Take 0.5-1 tablets (25-50 mg total) by mouth at bedtime as needed for sleep. 100 tablet 3   No current facility-administered medications for this visit.     PHYSICAL EXAMINATION: ECOG PERFORMANCE STATUS: 0 - Asymptomatic  Vitals:   03/18/18 1501  BP: 124/80  Pulse: 78  Resp: 18  Temp: 98.4 F (36.9 C)  SpO2: 100%   Filed Weights   03/18/18 1501  Weight: 179 lb 12.8 oz (81.6 kg)    GENERAL:alert, no distress and comfortable SKIN: skin color, texture, turgor are normal, no rashes or significant lesions EYES: normal, Conjunctiva are pink and non-injected, sclera clear OROPHARYNX:no exudate, no erythema and lips, buccal mucosa, and tongue normal  NECK: supple, thyroid normal size, non-tender, without nodularity LYMPH:  no palpable lymphadenopathy in the cervical, axillary or inguinal LUNGS: clear to auscultation and percussion with normal breathing effort HEART: regular rate & rhythm and no murmurs and no lower extremity edema ABDOMEN:abdomen soft, non-tender and normal bowel sounds MUSCULOSKELETAL:no cyanosis of digits and no clubbing  NEURO: alert & oriented x 3 with fluent speech, no focal motor/sensory deficits EXTREMITIES: No lower extremity edema BREAST: No palpable masses or nodules in either left breast. No palpable axillary supraclavicular or infraclavicular adenopathy no breast tenderness or nipple discharge. (exam performed in the presence of a chaperone)  LABORATORY DATA:  I have reviewed the data as listed CMP Latest Ref Rng & Units 02/16/2016 04/29/2015 12/02/2014  Glucose 70 - 99 mg/dL 99 90 106  BUN 6 - 23 mg/dL 11 9 7.3  Creatinine 0.40 - 1.20 mg/dL 0.69 0.72 0.7  Sodium 135 - 145 mEq/L 142 142 142  Potassium 3.5 - 5.1 mEq/L 3.8 4.0 3.6  Chloride 96 - 112 mEq/L 108 105 -  CO2 19 - 32 mEq/L _0 Calcium 8.4 - 10.5 mg/dL 9.0 9.7 9.3  Total Protein 6.0 - 8.3 g/dL 7.0 - 6.9  Total Bilirubin 0.2 - 1.2 mg/dL 0.4 - <0.30  Alkaline Phos 39 - 117 U/L 57 - 74  AST 0 - 37 U/L 15 - 23  ALT 0 - 35 U/L 12 - 24    Lab Results  Component Value Date   WBC 3.7 (L) 02/16/2016   HGB 12.5 02/16/2016   HCT 38.3 02/16/2016   MCV 85.2 02/16/2016   PLT 243.0 02/16/2016   NEUTROABS 2.4 02/16/2016    ASSESSMENT & PLAN:  Breast cancer of upper-outer quadrant of right female breast Right breast mastectomy 06/17/2014: IDC, 3.6 cm, grade 3, 0/4 lymph nodes, T2 N0 M0 stage II a, ER 0%, PR 0%, HER-2 negative, Ki-67 90% Adjuvant chemotherapy with dose dense Adriamycin and Cytoxan 4 followed by Abraxane weekly 12 from 07/22/2014 to 12/02/2014  Breast Cancer Surveillance: 1. Breast exam  03/18/2018: Normal Breast exam on the left breast. Right chest wall scar without any abnormalities. 2. Mammogram :  02/28/2018: No mammographic or ultrasound evidence of malignancy within the left breast.  Breast density category C  Patient is excited to go to [place] in October 2020. Return to clinic in 1 year for follow-up and after that she can be followed by her primary care physician.   No orders of the defined types were placed in this encounter.  The patient has a good understanding of the overall plan. she agrees with it. she will call with any problems that may develop before the next visit here.   Harriette Ohara, MD 03/18/18

## 2018-04-29 ENCOUNTER — Encounter: Payer: BC Managed Care – PPO | Admitting: Family Medicine

## 2018-04-29 ENCOUNTER — Ambulatory Visit (INDEPENDENT_AMBULATORY_CARE_PROVIDER_SITE_OTHER): Payer: BC Managed Care – PPO | Admitting: Family Medicine

## 2018-04-29 ENCOUNTER — Encounter: Payer: Self-pay | Admitting: Family Medicine

## 2018-04-29 ENCOUNTER — Other Ambulatory Visit: Payer: Self-pay

## 2018-04-29 DIAGNOSIS — G620 Drug-induced polyneuropathy: Secondary | ICD-10-CM

## 2018-04-29 DIAGNOSIS — G4700 Insomnia, unspecified: Secondary | ICD-10-CM

## 2018-04-29 DIAGNOSIS — R1310 Dysphagia, unspecified: Secondary | ICD-10-CM | POA: Diagnosis not present

## 2018-04-29 DIAGNOSIS — K5909 Other constipation: Secondary | ICD-10-CM | POA: Diagnosis not present

## 2018-04-29 DIAGNOSIS — T451X5A Adverse effect of antineoplastic and immunosuppressive drugs, initial encounter: Secondary | ICD-10-CM

## 2018-04-29 MED ORDER — GABAPENTIN 100 MG PO CAPS: ORAL_CAPSULE | ORAL | 3 refills | Status: DC

## 2018-04-29 NOTE — Progress Notes (Signed)
Pt did not appear for e-visit.

## 2018-04-29 NOTE — Progress Notes (Signed)
Virtual Visit via Video Note  I connected with Misty Snyder on 04/29/18 at  1:30 PM EDT by a video enabled telemedicine application and verified that I am speaking with the correct person using two identifiers.  Location patient: home Location provider: home office Persons participating in the virtual visit: patient, provider  I discussed the limitations of evaluation and management by telemedicine and the availability of in person appointments. The patient expressed understanding and agreed to proceed.   HPI: Pt is following up on chronic conditions and TOC, previously seen by Dr. Sherren Mocha.  In remission. dx'd in 2016 with R breast cancer.  S/p R mastectomy.  Still followed by Oncology.  Has neuropathy s/p chemo.  Takes gabapentin once of twice a day (rx for TID).  GI issues:  Saw GI.  Given rx for miralax for constipation, but still having digestive issues.  Endorses continued constipation, bloating, and feels like food gets "stuck in chest".  Denies difficulty with liquids.  Endorses issues with GERD.  On Omeprazole daily.   Repeat colonoscopy due Dec 2020- per chart review.  Insomnia: can't sleep at night.  Broken sleep. Wakes up in middle of the night, unable to go back to sleep x 3-4 hours.  Able to sleep during the day.  At times up 2/2 hot flashes.  Was on trazodone and ativan.  Melatonin didn't work.  May take Trazodone 100 mg nightly.  Watching tv before bed.  Has seen psych in the past, but notes meds were not changed.  Walks 3 miles per day for exercise.  Drinks 6-7 bottles of water per day, may add crystal light.  Pt mentions dried skin of b/l ears/face.  Itches at times.  Tried benadryl cream.  ROS: See pertinent positives and negatives per HPI.  Past Medical History:  Diagnosis Date  . Breast cancer ([place]) 05/15/14   right breast invasive ductal carcinoma  . Depression   . Malignant neoplasm of upper-outer quadrant of right female breast ([place])   . Personal history of  chemotherapy   . Uterine fibroid     Past Surgical History:  Procedure Laterality Date  . INSERTION CENTRAL VENOUS ACCESS DEVICE W/ SUBCUTANEOUS PORT  06/18/2014  . MASTECTOMY Right 2016  . MASTECTOMY W/ SENTINEL NODE BIOPSY Right 06/18/2014   WITH PORT A CATH PLACEMENT  . MASTECTOMY W/ SENTINEL NODE BIOPSY Right 06/17/2014   Procedure: RIGHT MASTECTOMY WITH SENTINEL LYMPH NODE BIOPSY WITH BLUE DYE MAPPING AND PORT PLACEMENT;  Surgeon: Donnie Mesa, MD;  Location: [facility];  Service: General;  Laterality: Right;  . NECK SURGERY    . PORTACATH PLACEMENT Left 06/17/2014   Procedure: INSERTION PORT-A-CATH;  Surgeon: Donnie Mesa, MD;  Location: [facility];  Service: General;  Laterality: Left;    Family History  Problem Relation Age of Onset  . Alcohol abuse Other   . Prostate cancer Maternal Grandfather        Dx 56s  . Esophageal cancer Neg Hx   . Colon cancer Neg Hx   . Pancreatic cancer Neg Hx   . Stomach cancer Neg Hx   . 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   Current Outpatient Medications:  .  gabapentin (NEURONTIN) 100 MG capsule, TAKE 1 CAPSULE BY MOUTH THREE TIMES A DAY, Disp: 270 capsule, Rfl: 4 .  LORazepam (ATIVAN) 0.5 MG tablet, TAKE 1 TABLET BY MOUTH AT BEDTIME, Disp: 60 tablet, Rfl: 0 .  naproxen (NAPROSYN) 500 MG tablet, Take 1 tablet (500 mg total) by mouth 2 (two) times daily with a meal., Disp: 120 tablet, Rfl: 5 .  omeprazole (PRILOSEC) 40 MG capsule, TAKE 1 CAPSULE BY MOUTH EVERY DAY, Disp: 100 capsule, Rfl: 4 .  polyethylene glycol powder (GLYCOLAX/MIRALAX) powder, Mix 17 g in 8 oz of water 1-3 x daily, Disp: 255 g, Rfl: 3 .  traZODone (DESYREL) 100 MG tablet, Take 1 tablet (100 mg total) by mouth at bedtime., Disp: 100 tablet, Rfl: 4 .  traZODone (DESYREL) 50 MG tablet, Take 0.5-1 tablets (25-50 mg total) by mouth at bedtime as needed for sleep., Disp: 100 tablet, Rfl: 3  EXAM:  VITALS per patient if applicable:  RR between 12-20  bpm.  GENERAL: alert, oriented, appears well and in no acute distress  HEENT: atraumatic, conjunctiva clear, no obvious abnormalities on inspection of external nose and ears. Video appears dark, unable to see pt's ears in detail.  NECK: normal movements of the head and neck  LUNGS: on inspection no signs of respiratory distress, breathing rate appears normal, no obvious gross SOB, gasping or wheezing  CV: no obvious cyanosis  MS: moves all visible extremities without noticeable abnormality  PSYCH/NEURO: pleasant and cooperative, no obvious depression or anxiety, speech and thought processing grossly intact  ASSESSMENT AND PLAN:  Discussed the following assessment and plan:  Dysphagia, unspecified type  -continue Omeprazole. Can increase to BID -discussed need for GI f/u and likely EGD - Plan: Ambulatory referral to Gastroenterology  Insomnia, unspecified type -discussed sleep hygiene.  Pt advised to stop watching tv before bed. -continue trazodone.  Pt advised this provider would not be able to fill Ativan. -consider f/u with psych again.  Chemotherapy-induced peripheral neuropathy (HCC)  - Plan: gabapentin (NEURONTIN) 100 MG capsule  Constipation, chronic  -discussed increasing po intake of fiber and fluids. -continue miralax.  -consider linzess - Plan: Ambulatory referral to Gastroenterology   I discussed the assessment and treatment plan with the patient. The patient was provided an opportunity to ask questions and all were answered. The patient agreed with the plan and demonstrated an understanding of the instructions.   The patient was advised to call back or seek an in-person evaluation if the symptoms worsen or if the condition fails to improve as anticipated.   Billie Ruddy, MD

## 2018-07-10 ENCOUNTER — Ambulatory Visit: Payer: BC Managed Care – PPO | Admitting: Internal Medicine

## 2018-08-02 ENCOUNTER — Telehealth: Payer: Self-pay

## 2018-08-02 NOTE — Telephone Encounter (Signed)
TC from Pt. 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 Gave her the fax number to fax form to be filled out.She stated she needed the form to be filled out by 08/07/18. She also requested the form be faxed back to HR at her job which she stated the number is at the bottom of the form.

## 2018-09-25 ENCOUNTER — Telehealth: Payer: Self-pay

## 2018-09-25 NOTE — Telephone Encounter (Signed)
Please schedule pt for TOC with Jonni Sanger. Thanks!

## 2018-09-25 NOTE — Telephone Encounter (Signed)
Dr. Jonni Sanger - Please advise on pt's request to transfer care to you. Thank you!

## 2018-09-25 NOTE — Telephone Encounter (Signed)
Ok

## 2018-09-25 NOTE — Telephone Encounter (Signed)
Copied from [place] [phone]. Topic: Appointment Scheduling - Transfer of Care >> Sep 25, 2018 10:51 AM Richardo Priest, NT wrote: Pt is requesting to transfer FROM: Dr.Banks/ [place] Pt is requesting to transfer TO: Dr.Andy/ [place] Reason for requested transfer: Patient not completely satisfied. Family goes to [facility] and would like to change there.    Send CRM to patient's current PCP (transferring FROM).

## 2018-09-25 NOTE — Telephone Encounter (Signed)
Called and left a voicemail to schedule a transfer of care appointment.

## 2018-09-25 NOTE — Telephone Encounter (Signed)
Dr. Banks - Please advise on pt's request to transfer care. Thank you! 

## 2018-09-25 NOTE — Telephone Encounter (Signed)
Ok with me. thanks 

## 2018-10-09 NOTE — Telephone Encounter (Signed)
Pt scheduled for TOC 10/24/18.

## 2018-10-24 ENCOUNTER — Encounter: Payer: Self-pay | Admitting: Family Medicine

## 2018-10-24 ENCOUNTER — Ambulatory Visit: Payer: BC Managed Care – PPO | Admitting: Family Medicine

## 2018-10-24 ENCOUNTER — Other Ambulatory Visit: Payer: Self-pay

## 2018-10-24 VITALS — BP 114/74 | HR 85 | Temp 97.2°F | Resp 16 | Ht 62.5 in | Wt 176.8 lb

## 2018-10-24 DIAGNOSIS — Z23 Encounter for immunization: Secondary | ICD-10-CM

## 2018-10-24 DIAGNOSIS — R6881 Early satiety: Secondary | ICD-10-CM | POA: Diagnosis not present

## 2018-10-24 DIAGNOSIS — G4701 Insomnia due to medical condition: Secondary | ICD-10-CM

## 2018-10-24 DIAGNOSIS — G620 Drug-induced polyneuropathy: Secondary | ICD-10-CM | POA: Diagnosis not present

## 2018-10-24 DIAGNOSIS — K5909 Other constipation: Secondary | ICD-10-CM | POA: Diagnosis not present

## 2018-10-24 DIAGNOSIS — T451X5A Adverse effect of antineoplastic and immunosuppressive drugs, initial encounter: Secondary | ICD-10-CM

## 2018-10-24 MED ORDER — GABAPENTIN 300 MG PO CAPS: 300.0000 mg | ORAL_CAPSULE | Freq: Two times a day (BID) | ORAL | 2 refills | Status: DC

## 2018-10-24 MED ORDER — TRAZODONE HCL 50 MG PO TABS: 100.0000 mg | ORAL_TABLET | Freq: Every evening | ORAL | 3 refills | Status: DC | PRN

## 2018-10-24 NOTE — Progress Notes (Signed)
Subjective  CC:  Chief Complaint  Patient presents with  . Transitions Of Care    Was previous pt with Dr. Sherren Mocha & Volanda Napoleon  . Insomnia    Takes Trazodone and not sleeping through the night  . Gastroesophageal Reflux    Omeprazole is not working well  . Possible Gout    Right foot, first 2 toes.. Taking Naproxen with minimal relief  . Back Pain    Take davil/Tylenol    HPI: Misty Snyder is a 58 y.o. female who presents to [facility] at [facility] today to establish care with me as a new patient.  Reviewed chart: notes from prior pcps, GI, and oncology.  She has the following concerns or needs:  58 yo single postmenopausal female presents to establish care.  Overall, she does very well but she does have past medical history significant for breast cancer diagnosed in 2016 status post chemotherapy with chemotherapy-induced neuropathy.  Since that time she has suffered from insomnia.  Her prior doctors have used low-dose Ativan and trazodone.  Ativan and trazodone worked best together she was on that for about a year.  Now only on low-dose trazodone and awakens early every morning with difficulty falling back to sleep.  She is used a friend's lavender lemonade which in combination with trazodone has helped significantly.  She has not been on other sedatives.  She denies stressors.  She is now doing well since her diagnosis of breast cancer although at first it was a struggle.  She has 1 more follow-up visit with the oncologist and then will likely be cleared.  Chronic GI symptoms including early satiety, chronic bloating, chronic constipation.  She has been on omeprazole for 2 years without any change in her symptoms.  She did see Dr. Fuller Plan once earlier in the year but did not have follow-up.  She denies odynophagia or dysphagia but does feel that food does not go all the way down in the stomach or if it feels blocked.  She is due for follow-up colonoscopy at the end of this  year for colon cancer screening.  She denies weight loss, malaise, fevers, increasing abdominal girth or fatigue.  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  She is on very low-dose gabapentin.  She does describe one episode of acute right foot pain that lasted less than 24 hours without joint pain.  She thought that it could be gout.  No history of gout.  Health maintenance: She is up-to-date on her mammograms and Pap smears.  Colon cancer screening is due at the end of this year.  She is due for a annual physical with lab work.  Assessment  1. Early satiety   2. Insomnia due to medical condition   3. Chemotherapy-induced peripheral neuropathy ([place])   4. Constipation, chronic   5. Need for immunization against influenza      Plan   Early satiety, constipation and bloating: Recommend follow-up with GI for further evaluation for esophageal disorders, reflux, rule out masses, biliary evaluation etc.  Patient will make appointment  Chemotherapy-induced peripheral neuropathy that is worsening.  Education given.  Increase gabapentin to 300 twice daily.  Insomnia, in part due to anxiety from breast cancer diagnosis but persists.  Education and counseling done.  Increase trazodone dose to 100 250 mg nightly.  Continue lavender or holistic therapies.  May try other things like valerian  root etc.  Return in 4 weeks for recheck.  Avoid benzos.  Updated flu shot  Return for complete physical  Follow up:  Return in about 4 weeks (around 11/21/2018) for recheck. Orders Placed This Encounter  Procedures  . Flu Vaccine QUAD 36+ mos IM   Meds ordered this encounter  Medications  . gabapentin (NEURONTIN) 300 MG capsule    Sig: Take 1 capsule (300 mg total) by mouth 2 (two) times daily.    Dispense:  60 capsule    Refill:  2  . traZODone (DESYREL) 50 MG tablet    Sig: Take 2 tablets (100 mg total) by  mouth at bedtime as needed for sleep.    Dispense:  180 tablet    Refill:  3     Depression screen PHQ 2/9 09/24/2017  Decreased Interest 0  Down, Depressed, Hopeless 0  PHQ - 2 Score 0    We updated and reviewed the patient's past history in detail and it is documented below.  Patient Active Problem List   Diagnosis Date Noted  . Esophageal dysmotility 06/14/2016  . Constipation, chronic 04/29/2015  . Chemotherapy-induced peripheral neuropathy ([place]) 10/21/2014  . Genetic testing 06/25/2014    Negative; No pathogenic mutations BreastNext (17 genes) @ [facility]: ATM, BARD1, BRCA1, BRCA2, BRIP1, CDH1, CHEK2, MRE11A, MUTYH, NBN, NF1, PALB2, PTEN, RAD50, RAD51C, RAD51D, and TP53. Tested 06/03/14   . Breast cancer of upper-outer quadrant of right female breast ([place]) 05/29/2014  . Insomnia due to medical condition 05/07/2012  . FIBROIDS, UTERUS 11/19/2008    Qualifier: Diagnosis of  By: Sherren Mocha MD, Jory Ee    . Depression 05/23/2007    Qualifier: Diagnosis of  By: Sherren Mocha MD, Jory Ee     Health Maintenance  Topic Date Due  . Hepatitis C Screening  09-28-1960  . HIV Screening  12/12/1975  . INFLUENZA VACCINE  08/17/2018  . COLONOSCOPY  01/07/2019  . MAMMOGRAM  03/01/2019  . PAP SMEAR-Modifier  06/15/2019  . TETANUS/TDAP  10/09/2019   Immunization History  Administered Date(s) Administered  . Influenza Whole 11/19/2008, 10/08/2009  . Influenza,inj,Quad PF,6+ Mos 10/16/2013, 12/02/2014, 10/24/2018  . Influenza-Unspecified 11/09/2015  . Pneumococcal Polysaccharide-23 02/16/2014  . Td 10/08/2009   Current Meds  Medication Sig  . gabapentin (NEURONTIN) 300 MG capsule Take 1 capsule (300 mg total) by mouth 2 (two) times daily.  . naproxen (NAPROSYN) 500 MG tablet Take 1 tablet (500 mg total) by mouth 2 (two) times daily with a meal.  . omeprazole (PRILOSEC) 40 MG capsule TAKE 1 CAPSULE BY MOUTH EVERY DAY  . polyethylene glycol powder (GLYCOLAX/MIRALAX) powder Mix 17 g in 8 oz of  water 1-3 x daily  . traZODone (DESYREL) 50 MG tablet Take 2 tablets (100 mg total) by mouth at bedtime as needed for sleep.  . [DISCONTINUED] gabapentin (NEURONTIN) 100 MG capsule TAKE 1 CAPSULE BY MOUTH TWO TIMES A DAY  . [DISCONTINUED] traZODone (DESYREL) 50 MG tablet Take 0.5-1 tablets (25-50 mg total) by mouth at bedtime as needed for sleep.    Allergies: Patient has No Known Allergies. Past Medical History Patient  has a past medical history of Breast cancer ([place]) (05/15/14), Depression, Personal history of chemotherapy, and Uterine fibroid. Past Surgical History Patient  has a past surgical history that includes Neck surgery; Mastectomy w/ sentinel node biopsy (Right, 06/18/2014); Insertion central venous access device w/ subcutaneous port (06/18/2014); Mastectomy w/ sentinel node biopsy (Right, 06/17/2014); Portacath placement (Left, 06/17/2014); and Mastectomy (Right, 2016). Family History: Patient  family history includes Alcohol abuse in an other family member; Prostate cancer in her maternal grandfather. Social History:  Patient  reports that she has never smoked. She has never used smokeless tobacco. She reports that she does not drink alcohol or use drugs.  Review of Systems: Constitutional: negative for fever or malaise Ophthalmic: negative for photophobia, double vision or loss of vision Cardiovascular: negative for chest pain, dyspnea on exertion, or new LE swelling Respiratory: negative for SOB or persistent cough Gastrointestinal: +for abdominal pain, change in bowel habits or melena Genitourinary: negative for dysuria or gross hematuria Musculoskeletal: negative for new gait disturbance or muscular weakness Integumentary: negative for new or persistent rashes Neurological: negative for TIA or stroke symptoms Psychiatric: negative for SI or delusions Allergic/Immunologic: negative for hives  Patient Care Team    Relationship Specialty Notifications Start End  Leamon Arnt, MD PCP - General Family Medicine  10/24/18   Nicholas Lose, MD Consulting Physician Hematology and Oncology  12/23/14   Donnie Mesa, MD Consulting Physician General Surgery  12/23/14   Sylvan Cheese, NP Nurse Practitioner Hematology and Oncology  12/23/14    Comment: Survivorship  Ladene Artist, MD Consulting Physician Gastroenterology  10/24/18     Objective  Vitals: BP 114/74   Pulse 85   Temp (!) 97.2 F (36.2 C) (Tympanic)   Resp 16   Ht 5' 2.5" (1.588 m)   Wt 176 lb 12.8 oz (80.2 kg)   LMP 06/28/2011   SpO2 98%   BMI 31.82 kg/m  General:  Well developed, well nourished, no acute distress  Psych:  Alert and oriented,normal mood and affect HEENT:  Normocephalic, atraumatic, non-icteric sclera oropharynx is without mass or exudate, supple neck without adenopathy, mass or thyromegaly Cardiovascular:  RRR without gallop, rub or murmur, nondisplaced PMI, +2 distal pulses Respiratory:  Good breath sounds bilaterally, CTAB with normal respiratory effort Gastrointestinal: normal bowel sounds, soft, non-tender, no noted masses. No HSM MSK: no deformities, contusions. Joints are without erythema or swelling Skin:  Warm, no rashes or suspicious lesions noted Neurologic:    Mental status is normal. Gross motor and sensory exams are normal. Normal gait   Commons side effects, risks, benefits, and alternatives for medications and treatment plan prescribed today were discussed, and the patient expressed understanding of the given instructions. Patient is instructed to call or message via MyChart if he/she has any questions or concerns regarding our treatment plan. No barriers to understanding were identified. We discussed Red Flag symptoms and signs in detail. Patient expressed understanding regarding what to do in case of urgent or emergency type symptoms.   Medication list was reconciled, printed and provided to the patient in AVS. Patient instructions and summary information  was reviewed with the patient as documented in the AVS. This note was prepared with assistance of Dragon voice recognition software. Occasional wrong-word or sound-a-like substitutions may have occurred due to the inherent limitations of voice recognition software

## 2018-10-24 NOTE — Patient Instructions (Signed)
Please return in 4 weeks to recheck insomnia and neuropathy. Please schedule your complete physical with me in 2-3 months; come fasting for lab work.   Today you were given your flu vaccination.   It was a pleasure meeting you today! Thank you for choosing [facility] to meet your healthcare needs! I truly look forward to working with you. If you have any questions or concerns, please send me a message via Mychart or call the office at [phone].   Fat and Cholesterol Restricted Eating Plan Getting too much fat and cholesterol in your diet may cause health problems. Choosing the right foods helps keep your fat and cholesterol at normal levels. This can keep you from getting certain diseases. Your doctor may recommend an eating plan that includes:  Total fat: ______% or less of total calories a day.  Saturated fat: ______% or less of total calories a day.  Cholesterol: less than _________mg a day.  Fiber: ______g a day. What are tips for following this plan? Meal planning  At meals, divide your plate into four equal parts: ? Fill one-half of your plate with vegetables and green salads. ? Fill one-fourth of your plate with whole grains. ? Fill one-fourth of your plate with low-fat (lean) protein foods.  Eat fish that is high in omega-3 fats at least two times a week. This includes mackerel, tuna, sardines, and salmon.  Eat foods that are high in fiber, such as whole grains, beans, apples, broccoli, carrots, peas, and barley. General tips   Work with your doctor to lose weight if you need to.  Avoid: ? Foods with added sugar. ? Fried foods. ? Foods with partially hydrogenated oils.  Limit alcohol intake to no more than 1 drink a day for nonpregnant women and 2 drinks a day for men. One drink equals 12 oz of beer, 5 oz of wine, or 1 oz of hard liquor. Reading food labels  Check food labels for: ? Trans fats. ? Partially hydrogenated oils. ? Saturated fat (g) in each serving. ?  Cholesterol (mg) in each serving. ? Fiber (g) in each serving.  Choose foods with healthy fats, such as: ? Monounsaturated fats. ? Polyunsaturated fats. ? Omega-3 fats.  Choose grain products that have whole grains. Look for the word "whole" as the first word in the ingredient list. Cooking  Cook foods using low-fat methods. These include baking, boiling, grilling, and broiling.  Eat more home-cooked foods. Eat at restaurants and buffets less often.  Avoid cooking using saturated fats, such as butter, cream, palm oil, palm kernel oil, and coconut oil. Recommended foods  Fruits  All fresh, canned (in natural juice), or frozen fruits. Vegetables  Fresh or frozen vegetables (raw, steamed, roasted, or grilled). Green salads. Grains  Whole grains, such as whole wheat or whole grain breads, crackers, cereals, and pasta. Unsweetened oatmeal, bulgur, barley, quinoa, or brown rice. Corn or whole wheat flour tortillas. Meats and other protein foods  Ground beef (85% or leaner), grass-fed beef, or beef trimmed of fat. Skinless chicken or [place]. Ground chicken or [place]. Pork trimmed of fat. All fish and seafood. Egg whites. Dried beans, peas, or lentils. Unsalted nuts or seeds. Unsalted canned beans. Nut butters without added sugar or oil. Dairy  Low-fat or nonfat dairy products, such as skim or 1% milk, 2% or reduced-fat cheeses, low-fat and fat-free ricotta or cottage cheese, or plain low-fat and nonfat yogurt. Fats and oils  Tub margarine without trans fats. Light or reduced-fat mayonnaise and  salad dressings. Avocado. Olive, canola, sesame, or safflower oils. The items listed above may not be a complete list of foods and beverages you can eat. Contact a dietitian for more information. Foods to avoid Fruits  Canned fruit in heavy syrup. Fruit in cream or butter sauce. Fried fruit. Vegetables  Vegetables cooked in cheese, cream, or butter sauce. Fried vegetables. Grains  White  bread. White pasta. White rice. Cornbread. Bagels, pastries, and croissants. Crackers and snack foods that contain trans fat and hydrogenated oils. Meats and other protein foods  Fatty cuts of meat. Ribs, chicken wings, bacon, sausage, bologna, salami, chitterlings, fatback, hot dogs, bratwurst, and packaged lunch meats. Liver and organ meats. Whole eggs and egg yolks. Chicken and [place] with skin. Fried meat. Dairy  Whole or 2% milk, cream, half-and-half, and cream cheese. Whole milk cheeses. Whole-fat or sweetened yogurt. Full-fat cheeses. Nondairy creamers and whipped toppings. Processed cheese, cheese spreads, and cheese curds. Beverages  Alcohol. Sugar-sweetened drinks such as sodas, lemonade, and fruit drinks. Fats and oils  Butter, stick margarine, lard, shortening, ghee, or bacon fat. Coconut, palm kernel, and palm oils. Sweets and desserts  Corn syrup, sugars, honey, and molasses. Candy. Jam and jelly. Syrup. Sweetened cereals. Cookies, pies, cakes, donuts, muffins, and ice cream. The items listed above may not be a complete list of foods and beverages you should avoid. Contact a dietitian for more information. Summary  Choosing the right foods helps keep your fat and cholesterol at normal levels. This can keep you from getting certain diseases.  At meals, fill one-half of your plate with vegetables and green salads.  Eat high-fiber foods, like whole grains, beans, apples, carrots, peas, and barley.  Limit added sugar, saturated fats, alcohol, and fried foods. This information is not intended to replace advice given to you by your health care provider. Make sure you discuss any questions you have with your health care provider. Document Released: 07/04/2011 Document Revised: 09/05/2017 Document Reviewed: 09/19/2016 Elsevier Patient Education  [employer].   Exercise to Lose Weight Exercise and a healthy diet may help you lose weight. Your doctor may suggest specific  exercises. EXERCISE IDEAS AND TIPS  Choose low-cost things you enjoy doing, such as walking, bicycling, or exercising to workout videos.  Take stairs instead of the elevator.  Walk during your lunch break.  Park your car further away from work or school.  Go to a gym or an exercise class.  Start with 5 to 10 minutes of exercise each day. Build up to 30 minutes of exercise 4 to 6 days a week.  Wear shoes with good support and comfortable clothes.  Stretch before and after working out.  Work out until you breathe harder and your heart beats faster.  Drink extra water when you exercise.  Do not do so much that you hurt yourself, feel dizzy, or get very short of breath. Exercises that burn about 150 calories:  Running 1  miles in 15 minutes.  Playing volleyball for 45 to 60 minutes.  Washing and waxing a car for 45 to 60 minutes.  Playing touch football for 45 minutes.  Walking 1  miles in 35 minutes.  Pushing a stroller 1  miles in 30 minutes.  Playing basketball for 30 minutes.  Raking leaves for 30 minutes.  Bicycling 5 miles in 30 minutes.  Walking 2 miles in 30 minutes.  Dancing for 30 minutes.  Shoveling snow for 15 minutes.  Swimming laps for 20 minutes.  Walking up  stairs for 15 minutes.  Bicycling 4 miles in 15 minutes.  Gardening for 30 to 45 minutes.  Jumping rope for 15 minutes.  Washing windows or floors for 45 to 60 minutes.

## 2018-12-05 ENCOUNTER — Ambulatory Visit: Payer: BC Managed Care – PPO | Admitting: Gastroenterology

## 2018-12-16 ENCOUNTER — Encounter: Payer: Self-pay | Admitting: Gastroenterology

## 2018-12-20 ENCOUNTER — Telehealth: Payer: Self-pay | Admitting: Family Medicine

## 2018-12-20 MED ORDER — FLUCONAZOLE 150 MG PO TABS: ORAL_TABLET | ORAL | 0 refills | Status: DC

## 2018-12-20 NOTE — Telephone Encounter (Signed)
See note

## 2018-12-20 NOTE — Telephone Encounter (Signed)
Please advise 

## 2018-12-20 NOTE — Telephone Encounter (Signed)
Patient calling to inquire if Dr. Jonni Sanger would be willing to call in RX for potential yeast infection. Patient states she is only experiencing vaginal itchiness at this time. Patient declined appointment. Please advise.

## 2018-12-30 ENCOUNTER — Telehealth: Payer: Self-pay | Admitting: Family Medicine

## 2018-12-30 NOTE — Telephone Encounter (Signed)
Please schedule in office visit.  Ok to use same day if needed. Thanks

## 2018-12-30 NOTE — Telephone Encounter (Signed)
Copied from [facility] [id number]. Topic: Appointment Scheduling - Scheduling Inquiry for Clinic >> Dec 30, 2018 11:42 AM Percell Belt A wrote: Reason for CRM: pt called in and stated that she is stilling having issue with the yeast infection and would like to see if Dr Jonni Sanger could get her in the week.  She prefers to only see her.  She is good with either in office or virtual  Best number [phone]  Please advise if I can use a sameday appt time to schedule patient, and if you prefer in office or virtual. Thank you!

## 2018-12-31 ENCOUNTER — Other Ambulatory Visit: Payer: Self-pay

## 2019-01-01 ENCOUNTER — Other Ambulatory Visit (HOSPITAL_COMMUNITY)
Admission: RE | Admit: 2019-01-01 | Discharge: 2019-01-01 | Disposition: A | Discharge status: Home / Self Care | Payer: BC Managed Care – PPO | Source: Ambulatory Visit | Attending: Family Medicine | Admitting: Family Medicine

## 2019-01-01 ENCOUNTER — Encounter: Payer: Self-pay | Admitting: Family Medicine

## 2019-01-01 ENCOUNTER — Ambulatory Visit: Payer: BC Managed Care – PPO | Admitting: Family Medicine

## 2019-01-01 VITALS — BP 122/80 | HR 80 | Temp 97.9°F | Ht 62.5 in | Wt 173.0 lb

## 2019-01-01 DIAGNOSIS — B3731 Acute candidiasis of vulva and vagina: Secondary | ICD-10-CM

## 2019-01-01 DIAGNOSIS — N898 Other specified noninflammatory disorders of vagina: Secondary | ICD-10-CM | POA: Diagnosis not present

## 2019-01-01 DIAGNOSIS — B373 Candidiasis of vulva and vagina: Secondary | ICD-10-CM

## 2019-01-01 LAB — POCT GLUCOSE (DEVICE FOR HOME USE): Glucose Fasting, POC: 103 mg/dL — AB (ref 70–99)

## 2019-01-01 MED ORDER — FLUCONAZOLE 100 MG PO TABS: 100.0000 mg | ORAL_TABLET | Freq: Every day | ORAL | 0 refills | Status: AC

## 2019-01-01 NOTE — Progress Notes (Signed)
Subjective  CC:  Chief Complaint  Patient presents with  . Vaginitis    HPI: Misty Snyder is a 58 y.o. female who presents to the office today to address the problems listed above in the chief complaint.  Patient presents for evaluation of an abnormal vaginal symptoms: she describes  Internal vaginal itchint without discharge, lumps bumps or sores, no odor and no associated sxs. Feels well. Started about 2 weeks ago; treated with diflucan x 2 with some mild relief, but itching still present. Sexually transmitted infection risk: Very low risk of STD exposure. The patient denies history of sexually transmitted disease.. She declines serology testing and HIV testing today. She denies urinary sxs. She uses None for birth control. She denies sxs of hyperglycemia. No new soaps, detergents or creams. Has eczema but not currently flared. No rashes noted.  I reviewed the patients updated PMH, FH, and SocHx.    Patient Active Problem List   Diagnosis Date Noted  . Esophageal dysmotility 06/14/2016  . Constipation, chronic 04/29/2015  . Chemotherapy-induced peripheral neuropathy ([place]) 10/21/2014  . Genetic testing 06/25/2014  . Breast cancer of upper-outer quadrant of right female breast ([place]) 05/29/2014  . Insomnia due to medical condition 05/07/2012  . FIBROIDS, UTERUS 11/19/2008  . Depression 05/23/2007   Current Meds  Medication Sig  . gabapentin (NEURONTIN) 300 MG capsule Take 1 capsule (300 mg total) by mouth 2 (two) times daily.  . naproxen (NAPROSYN) 500 MG tablet Take 1 tablet (500 mg total) by mouth 2 (two) times daily with a meal.  . omeprazole (PRILOSEC) 40 MG capsule TAKE 1 CAPSULE BY MOUTH EVERY DAY  . polyethylene glycol powder (GLYCOLAX/MIRALAX) powder Mix 17 g in 8 oz of water 1-3 x daily  . traZODone (DESYREL) 50 MG tablet Take 2 tablets (100 mg total) by mouth at bedtime as needed for sleep.    Allergies: Patient has No Known Allergies. Family History: Patient  family history includes Alcohol abuse in an other family member; Prostate cancer in her maternal grandfather. Social History:  Patient  reports that she has never smoked. She has never used smokeless tobacco. She reports that she does not drink alcohol or use drugs.  Review of Systems: Constitutional: Negative for fever malaise or anorexia Cardiovascular: negative for chest pain Respiratory: negative for SOB or persistent cough Gastrointestinal: negative for abdominal pain  Objective  Vitals: BP 122/80 (BP Location: Right Arm, Patient Position: Sitting, Cuff Size: Normal)   Pulse 80   Temp 97.9 F (36.6 C) (Temporal)   Ht 5' 2.5" (1.588 m)   Wt 173 lb (78.5 kg)   LMP 06/28/2011   SpO2 93%   BMI 31.14 kg/m  General: no acute distress , A&Ox3 Cardiovascular:  RRR without murmur or gallop.  Gastrointestinal: soft, flat abdomen, normal active bowel sounds, no palpable masses, no hepatosplenomegaly GYN: external perineum and vulva is normal appearing without rash or erythema. Vaginal mucosa is inflamed with white discharge present. Clear cervix Skin:  Warm, no rashes  Assessment  1. Yeast vaginitis   2. Vaginal itching    Office Visit on 01/01/2019  Component Date Value Ref Range Status  . Glucose Fasting, POC 01/01/2019 103* 70 - 99 mg/dL Final     Plan   Yeast vag, persistent:  Retreat with diflucan 100 x 5 days, await vaginal swab for infection and check random glucose. F/u if persists.  Follow up: Return for as scheduled.    Commons side effects, risks, benefits, and alternatives  for medications and treatment plan prescribed today were discussed, and the patient expressed understanding of the given instructions. Patient is instructed to call or message via MyChart if he/she has any questions or concerns regarding our treatment plan. No barriers to understanding were identified. We discussed Red Flag symptoms and signs in detail. Patient expressed understanding regarding what  to do in case of urgent or emergency type symptoms.   Medication list was reconciled, printed and provided to the patient in AVS. Patient instructions and summary information was reviewed with the patient as documented in the AVS. This note was prepared with assistance of Dragon voice recognition software. Occasional wrong-word or sound-a-like substitutions may have occurred due to the inherent limitations of voice recognition software  Orders Placed This Encounter  Procedures  . [facility]   Meds ordered this encounter  Medications  . fluconazole (DIFLUCAN) 100 MG tablet    Sig: Take 1 tablet (100 mg total) by mouth daily for 5 days.    Dispense:  5 tablet    Refill:  0

## 2019-01-01 NOTE — Patient Instructions (Addendum)
Please follow up as scheduled for your next visit with me: 02/25/2019   I suspect this is a yeast infection. Testing will help clarify. Take the diflucan daily for 5 days.   I will release your lab results to you on your MyChart account with further instructions. Please reply with any questions.   If you have any questions or concerns, please don't hesitate to send me a message via MyChart or call the office at [phone]. Thank you for visiting with Misty Snyder today! It's our pleasure caring for you.   Vaginal Yeast infection, Adult  Vaginal yeast infection is a condition that causes vaginal discharge as well as soreness, swelling, and redness (inflammation) of the vagina. This is a common condition. Some women get this infection frequently. What are the causes? This condition is caused by a change in the normal balance of the yeast (candida) and bacteria that live in the vagina. This change causes an overgrowth of yeast, which causes the inflammation. What increases the risk? The condition is more likely to develop in women who:  Take antibiotic medicines.  Have diabetes.  Take birth control pills.  Are pregnant.  Douche often.  Have a weak body defense system (immune system).  Have been taking steroid medicines for a long time.  Frequently wear tight clothing. What are the signs or symptoms? Symptoms of this condition include:  White, thick, creamy vaginal discharge.  Swelling, itching, redness, and irritation of the vagina. The lips of the vagina (vulva) may be affected as well.  Pain or a burning feeling while urinating.  Pain during sex. How is this diagnosed? This condition is diagnosed based on:  Your medical history.  A physical exam.  A pelvic exam. Your health care provider will examine a sample of your vaginal discharge under a microscope. Your health care provider may send this sample for testing to confirm the diagnosis. How is this treated? This condition is  treated with medicine. Medicines may be over-the-counter or prescription. You may be told to use one or more of the following:  Medicine that is taken by mouth (orally).  Medicine that is applied as a cream (topically).  Medicine that is inserted directly into the vagina (suppository). Follow these instructions at home:  Lifestyle  Do not have sex until your health care provider approves. Tell your sex partner that you have a yeast infection. That person should go to his or her health care provider and ask if they should also be treated.  Do not wear tight clothes, such as pantyhose or tight pants.  Wear breathable cotton underwear. General instructions  Take or apply over-the-counter and prescription medicines only as told by your health care provider.  Eat more yogurt. This may help to keep your yeast infection from returning.  Do not use tampons until your health care provider approves.  Try taking a sitz bath to help with discomfort. This is a warm water bath that is taken while you are sitting down. The water should only come up to your hips and should cover your buttocks. Do this 3-4 times per day or as told by your health care provider.  Do not douche.  If you have diabetes, keep your blood sugar levels under control.  Keep all follow-up visits as told by your health care provider. This is important. Contact a health care provider if:  You have a fever.  Your symptoms go away and then return.  Your symptoms do not get better with treatment.  Your  symptoms get worse.  You have new symptoms.  You develop blisters in or around your vagina.  You have blood coming from your vagina and it is not your menstrual period.  You develop pain in your abdomen. Summary  Vaginal yeast infection is a condition that causes discharge as well as soreness, swelling, and redness (inflammation) of the vagina.  This condition is treated with medicine. Medicines may be  over-the-counter or prescription.  Take or apply over-the-counter and prescription medicines only as told by your health care provider.  Do not douche. Do not have sex or use tampons until your health care provider approves.  Contact a health care provider if your symptoms do not get better with treatment or your symptoms go away and then return. This information is not intended to replace advice given to you by your health care provider. Make sure you discuss any questions you have with your health care provider. Document Released: 10/12/2004 Document Revised: 05/21/2017 Document Reviewed: 05/21/2017 Elsevier Patient Education  [employer] [place].

## 2019-01-02 LAB — CERVICOVAGINAL ANCILLARY ONLY
Bacterial Vaginitis (gardnerella): POSITIVE — AB
Candida Glabrata: NEGATIVE
Candida Vaginitis: NEGATIVE
Chlamydia: NEGATIVE
Comment: NEGATIVE
Comment: NEGATIVE
Comment: NEGATIVE
Comment: NEGATIVE
Comment: NEGATIVE
Comment: NORMAL
Neisseria Gonorrhea: NEGATIVE
Trichomonas: NEGATIVE

## 2019-01-03 MED ORDER — METRONIDAZOLE 500 MG PO TABS: 500.0000 mg | ORAL_TABLET | Freq: Two times a day (BID) | ORAL | 0 refills | Status: AC

## 2019-01-03 NOTE — Addendum Note (Signed)
Addended by: Billey Chang on: 01/03/2019 08:02 AM   Modules accepted: Orders

## 2019-01-08 ENCOUNTER — Ambulatory Visit: Payer: BC Managed Care – PPO | Admitting: Gastroenterology

## 2019-01-13 ENCOUNTER — Telehealth: Payer: Self-pay | Admitting: Family Medicine

## 2019-01-13 NOTE — Telephone Encounter (Signed)
Pt has taken the complete round of metroNIDAZOLE (FLAGYL) 500 MG tablet But it has not worked and the Pt is asking if there is another option to have sent to the pharmacy/ please advise

## 2019-01-13 NOTE — Telephone Encounter (Signed)
Please advise 

## 2019-01-13 NOTE — Telephone Encounter (Signed)
Please see note.

## 2019-01-14 NOTE — Telephone Encounter (Signed)
That is the correct antibiotic for BV. If she still has persistent symptoms, would need another OV for swab/testing again to see if infection persists.

## 2019-01-14 NOTE — Telephone Encounter (Signed)
Pt would like Jasmine to give her a call back.  She does not want to come back in the office for another pelvic.   CB# [phone]

## 2019-01-14 NOTE — Telephone Encounter (Signed)
Patient would rather try another antibiotic first instead of coming back to the office. Patient states that she is still having the same symptoms. She has concerns about her co-pay and wants to know if this will be waived since she was just in 2 weeks ago for this? Please advise.

## 2019-01-14 NOTE — Telephone Encounter (Signed)
I do not recommend another antibiotic. At this point it could be yeast or something else.  We treated BV and her symptoms should be improved.  No, because another office visit is needed, her copay would be required.   She can try an otc yeast medication if she'd like.  If still having troubles, would need to be reevaluated.

## 2019-01-23 ENCOUNTER — Other Ambulatory Visit: Payer: Self-pay | Admitting: Hematology and Oncology

## 2019-01-23 DIAGNOSIS — Z1231 Encounter for screening mammogram for malignant neoplasm of breast: Secondary | ICD-10-CM

## 2019-01-24 ENCOUNTER — Other Ambulatory Visit: Payer: Self-pay

## 2019-01-27 ENCOUNTER — Other Ambulatory Visit (HOSPITAL_COMMUNITY)
Admission: RE | Admit: 2019-01-27 | Discharge: 2019-01-27 | Disposition: A | Discharge status: Home / Self Care | Payer: BC Managed Care – PPO | Source: Ambulatory Visit | Attending: Family Medicine | Admitting: Family Medicine

## 2019-01-27 ENCOUNTER — Other Ambulatory Visit: Payer: Self-pay

## 2019-01-27 ENCOUNTER — Encounter: Payer: Self-pay | Admitting: Family Medicine

## 2019-01-27 ENCOUNTER — Ambulatory Visit (INDEPENDENT_AMBULATORY_CARE_PROVIDER_SITE_OTHER): Payer: BC Managed Care – PPO | Admitting: Family Medicine

## 2019-01-27 VITALS — BP 126/68 | HR 80 | Temp 98.0°F | Ht 62.5 in | Wt 174.8 lb

## 2019-01-27 DIAGNOSIS — N898 Other specified noninflammatory disorders of vagina: Secondary | ICD-10-CM | POA: Diagnosis present

## 2019-01-27 DIAGNOSIS — N949 Unspecified condition associated with female genital organs and menstrual cycle: Secondary | ICD-10-CM | POA: Diagnosis not present

## 2019-01-27 MED ORDER — POLYETHYLENE GLYCOL 3350 17 GM/SCOOP PO POWD: 1.0000 | Freq: Every day | ORAL | 3 refills | Status: DC | PRN

## 2019-01-27 NOTE — Patient Instructions (Addendum)
Please follow up as scheduled for your next visit with me: 02/25/2019 for your physical.   I will let you know your results: if infection is present we will treat it.  If not, I will send in vaginal estrogen cream to use to see if we can help. If these do not help, I recommend seeing a gynecologist.   If you have any questions or concerns, please don't hesitate to send me a message via MyChart or call the office at [phone]. Thank you for visiting with [place] today! It's our pleasure caring for you.

## 2019-01-27 NOTE — Progress Notes (Signed)
Subjective  CC:  Chief Complaint  Patient presents with  . Possible BV    HPI: Misty Snyder is a 59 y.o. female who presents to the office today to address the problems listed above in the chief complaint. Patient presents for evaluation of an abnormal vaginal discomfort: back in mid December she was treated for presumed yeast infection; OV revealed BV on testing and she was treated with flagyl. However, sxs persist. she describes  scant with constant itching and sharp discomfort internally. No external itching, rash. No vaginal bleeding. She is postmenopausal; not currently sexually active.She denies urinary or GI sxs.   I reviewed the patients updated PMH, FH, and SocHx.    Patient Active Problem List   Diagnosis Date Noted  . 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 History of right breast cancer 2016 05/29/2014    Priority: High  . Constipation, chronic 04/29/2015    Priority: Medium  . 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 FIBROIDS, UTERUS 11/19/2008    Priority: Medium  . Esophageal dysmotility 06/14/2016  . Genetic testing 06/25/2014  . Depression 05/23/2007   Current Meds  Medication Sig  . gabapentin (NEURONTIN) 300 MG capsule Take 1 capsule (300 mg total) by mouth 2 (two) times daily.  . naproxen (NAPROSYN) 500 MG tablet Take 1 tablet (500 mg total) by mouth 2 (two) times daily with a meal.  . traZODone (DESYREL) 50 MG tablet Take 2 tablets (100 mg total) by mouth at bedtime as needed for sleep.    Allergies: Patient has No Known Allergies. Family History: Patient family history includes Alcohol abuse in an other family member; Prostate cancer in her maternal grandfather. Social History:  Patient  reports that she has never smoked. She has never used smokeless tobacco. She reports that she does not drink alcohol or use drugs.  Review of Systems: Constitutional: Negative for fever  malaise or anorexia Cardiovascular: negative for chest pain Respiratory: negative for SOB or persistent cough Gastrointestinal: negative for abdominal pain  Objective  Vitals: BP 126/68 (BP Location: Right Arm, Patient Position: Sitting, Cuff Size: Normal)   Pulse 80   Temp 98 F (36.7 C) (Temporal)   Ht 5' 2.5" (1.588 m)   Wt 174 lb 12.8 oz (79.3 kg)   LMP 06/28/2011   SpO2 94%   BMI 31.46 kg/m  General: no acute distress , A&Ox3  GYN: external exam is normal w/o rash; introitus is normal appearing. Thin vaginal mucosa with scant discharge. nontender fundus. No adnexal masses or ttp.  Skin:  Warm, no rashes  Assessment  1. Vaginal discomfort      Plan   Suspect atrophic vaginitis +/- BV:  Educated. Await testing. Treat infection if present. If not, will start estrogen cream. If can't get improved, rec gyn evaluation.   Follow up: 02/25/2019 For cpe    Commons side effects, risks, benefits, and alternatives for medications and treatment plan prescribed today were discussed, and the patient expressed understanding of the given instructions. Patient is instructed to call or message via MyChart if he/she has any questions or concerns regarding our treatment plan. No barriers to understanding were identified. We discussed Red Flag symptoms and signs in detail. Patient expressed understanding regarding what to do in case of urgent or emergency type symptoms.   Medication list was reconciled, printed and provided to the patient in AVS. Patient instructions and summary information was reviewed with the  patient as documented in the AVS. This note was prepared with assistance of Dragon voice recognition software. Occasional wrong-word or sound-a-like substitutions may have occurred due to the inherent limitations of voice recognition software  No orders of the defined types were placed in this encounter.  Meds ordered this encounter  Medications  . polyethylene glycol powder  (GLYCOLAX/MIRALAX) 17 GM/SCOOP powder    Sig: Take 255 g by mouth daily as needed. Mix 17 g in 8 oz of water 1-3 x daily    Dispense:  255 g    Refill:  3

## 2019-01-28 LAB — CERVICOVAGINAL ANCILLARY ONLY
Bacterial Vaginitis (gardnerella): NEGATIVE
Candida Glabrata: NEGATIVE
Candida Vaginitis: NEGATIVE
Chlamydia: NEGATIVE
Comment: NEGATIVE
Comment: NEGATIVE
Comment: NEGATIVE
Comment: NEGATIVE
Comment: NEGATIVE
Comment: NORMAL
Neisseria Gonorrhea: NEGATIVE
Trichomonas: NEGATIVE

## 2019-01-28 MED ORDER — ESTRADIOL 0.1 MG/GM VA CREA: 1.0000 | TOPICAL_CREAM | VAGINAL | 5 refills | Status: DC

## 2019-01-28 NOTE — Addendum Note (Signed)
Addended by: Billey Chang on: 01/28/2019 04:15 PM   Modules accepted: Orders

## 2019-02-14 ENCOUNTER — Ambulatory Visit (INDEPENDENT_AMBULATORY_CARE_PROVIDER_SITE_OTHER): Payer: BC Managed Care – PPO | Admitting: Gastroenterology

## 2019-02-14 ENCOUNTER — Encounter: Payer: Self-pay | Admitting: Gastroenterology

## 2019-02-14 VITALS — BP 110/70 | HR 80 | Temp 98.1°F | Ht 63.0 in | Wt 176.0 lb

## 2019-02-14 DIAGNOSIS — K5909 Other constipation: Secondary | ICD-10-CM | POA: Diagnosis not present

## 2019-02-14 DIAGNOSIS — K219 Gastro-esophageal reflux disease without esophagitis: Secondary | ICD-10-CM

## 2019-02-14 DIAGNOSIS — Z01818 Encounter for other preprocedural examination: Secondary | ICD-10-CM

## 2019-02-14 DIAGNOSIS — R1310 Dysphagia, unspecified: Secondary | ICD-10-CM | POA: Diagnosis not present

## 2019-02-14 DIAGNOSIS — Z1211 Encounter for screening for malignant neoplasm of colon: Secondary | ICD-10-CM | POA: Diagnosis not present

## 2019-02-14 DIAGNOSIS — Z1212 Encounter for screening for malignant neoplasm of rectum: Secondary | ICD-10-CM

## 2019-02-14 MED ORDER — PANTOPRAZOLE SODIUM 40 MG PO TBEC: 40.0000 mg | DELAYED_RELEASE_TABLET | Freq: Every day | ORAL | 11 refills | Status: DC

## 2019-02-14 MED ORDER — NA SULFATE-K SULFATE-MG SULF 17.5-3.13-1.6 GM/177ML PO SOLN: 1.0000 | Freq: Once | ORAL | 0 refills | Status: AC

## 2019-02-14 NOTE — Progress Notes (Signed)
    History of Present Illness: This is a 59 year old female with difficulty swallowing, heartburn and constipation. All symptoms present for a few years and have increased in severity over the past year. Dysphagia is only with solids and occurs daily. Omeprazole for 1 month did not help heartburn or dysphagia. Miralax once daily did not help constipation. MOM works however she notes cramping. Chart review show a history of esophageal dysmotility however I cannot locate a barium study or manometry. Denies weight loss, abdominal pain, diarrhea, change in stool caliber, melena, hematochezia, nausea, vomiting, chest pain.   Colonoscopy 12/2008 showing melanosis coli and internal hemorrhoids.   Current Medications, Allergies, Past Medical History, Past Surgical History, Family History and Social History were reviewed in [employer] record.   Physical Exam: General: Well developed, well nourished, no acute distress Head: Normocephalic and atraumatic Eyes:  sclerae anicteric, EOMI Ears: Normal auditory acuity Mouth: No deformity or lesions Lungs: Clear throughout to auscultation Heart: Regular rate and rhythm; no murmurs, rubs or bruits Abdomen: Soft, non tender and non distended. No masses, hepatosplenomegaly or hernias noted. Normal Bowel sounds Rectal: Deferred to colonoscopy  Musculoskeletal: Symmetrical with no gross deformities  Pulses:  Normal pulses noted Extremities: No clubbing, cyanosis, edema or deformities noted Neurological: Alert oriented x 4, grossly nonfocal Psychological:  Alert and cooperative. Normal mood and affect   Assessment and Recommendations:  1. Dysphagia. GERD. R/O stricture, esophagitis, dysmotility. Begin pantoprazole 40 mg po qd. Follow antireflux measures. Schedule EGD, possible dilation. The risks (including bleeding, perforation, infection, missed lesions, medication reactions and possible hospitalization or surgery if complications  occur), benefits, and alternatives to endoscopy with possible biopsy and possible dilation were discussed with the patient and they consent to proceed.   2. Constipation, chronic. Increase Miralax to bid. Increase dietary fiber and water intake.   3. CRC screening, average risk. Schedule colonoscopy. The risks (including bleeding, perforation, infection, missed lesions, medication reactions and possible hospitalization or surgery if complications occur), benefits, and alternatives to colonoscopy with possible biopsy and possible polypectomy were discussed with the patient and they consent to proceed.

## 2019-02-14 NOTE — Patient Instructions (Signed)
We have sent the following medications to your pharmacy for you to pick up at your convenience: pantoprazole.   Increase your Miralax to twice daily.   Patient advised to avoid spicy, acidic, citrus, chocolate, mints, fruit and fruit juices.  Limit the intake of caffeine, alcohol and Soda.  Don't exercise too soon after eating.  Don't lie down within 3-4 hours of eating.  Elevate the head of your bed.  You have been scheduled for an endoscopy and colonoscopy. Please follow the written instructions given to you at your visit today. Please pick up your prep supplies at the pharmacy within the next 1-3 days. If you use inhalers (even only as needed), please bring them with you on the day of your procedure. Your physician has requested that you go to www.startemmi.com and enter the access code given to you at your visit today. This web site gives a general overview about your procedure. However, you should still follow specific instructions given to you by our office regarding your preparation for the procedure.  Thank you for choosing me and [facility].  Pricilla Riffle. Dagoberto Ligas., MD., Marval Regal  Normal BMI (Body Mass Index- based on height and weight) is between 19 and 25. Your BMI today is Body mass index is 31.18 kg/m. Marland Kitchen Please consider follow up  regarding your BMI with your Primary Care Provider.

## 2019-02-17 ENCOUNTER — Telehealth: Payer: Self-pay | Admitting: Gastroenterology

## 2019-02-17 NOTE — Telephone Encounter (Signed)
Left message for patient to return my call.

## 2019-02-18 NOTE — Telephone Encounter (Signed)
Left a message for patient to return my call. 

## 2019-02-19 ENCOUNTER — Encounter: Payer: Self-pay | Admitting: Gastroenterology

## 2019-02-19 MED ORDER — NA SULFATE-K SULFATE-MG SULF 17.5-3.13-1.6 GM/177ML PO SOLN: 1.0000 | Freq: Once | ORAL | 0 refills | Status: AC

## 2019-02-19 NOTE — Telephone Encounter (Signed)
Informed patient that I will send the Suprep to another pharmacy to see if they have it in stock. Patient verbalized understanding.

## 2019-02-19 NOTE — Telephone Encounter (Signed)
Left another message for patient to return my call. Called x 3. Waiting on patient to return my call.

## 2019-02-19 NOTE — Addendum Note (Signed)
Addended by: Marzella Schlein on: 02/19/2019 02:22 PM   Modules accepted: Orders

## 2019-02-20 ENCOUNTER — Ambulatory Visit (INDEPENDENT_AMBULATORY_CARE_PROVIDER_SITE_OTHER): Payer: BC Managed Care – PPO

## 2019-02-20 DIAGNOSIS — Z1159 Encounter for screening for other viral diseases: Secondary | ICD-10-CM

## 2019-02-21 ENCOUNTER — Telehealth: Payer: Self-pay | Admitting: Gastroenterology

## 2019-02-21 LAB — SARS CORONAVIRUS 2 (TAT 6-24 HRS): SARS Coronavirus 2: NEGATIVE

## 2019-02-21 NOTE — Telephone Encounter (Signed)
Patient returned my call. I informed patient I could not get her prep any lower then $97. Offered patient Nila Nephew and a free prep as long as she can pick it up. Patient states she cannot come by today so she will reschedule. Rescheduled patient for 03/03/19 and covid screen for 02/27/19. Patient verbalized understanding and patient's prep put on my chart for her to review. Informed patient to contact me if she has any questions regarding prep. Patient verbalized understanding.

## 2019-02-21 NOTE — Telephone Encounter (Signed)
Left message for patient to return my call.

## 2019-02-24 ENCOUNTER — Encounter: Payer: BC Managed Care – PPO | Admitting: Gastroenterology

## 2019-02-25 ENCOUNTER — Encounter: Payer: Self-pay | Admitting: Family Medicine

## 2019-02-25 ENCOUNTER — Other Ambulatory Visit: Payer: Self-pay

## 2019-02-25 ENCOUNTER — Ambulatory Visit (INDEPENDENT_AMBULATORY_CARE_PROVIDER_SITE_OTHER): Payer: BC Managed Care – PPO | Admitting: Family Medicine

## 2019-02-25 VITALS — BP 128/78 | HR 72 | Temp 98.5°F | Ht 62.0 in | Wt 177.2 lb

## 2019-02-25 DIAGNOSIS — G4701 Insomnia due to medical condition: Secondary | ICD-10-CM

## 2019-02-25 DIAGNOSIS — Z853 Personal history of malignant neoplasm of breast: Secondary | ICD-10-CM | POA: Diagnosis not present

## 2019-02-25 DIAGNOSIS — K5909 Other constipation: Secondary | ICD-10-CM

## 2019-02-25 DIAGNOSIS — N951 Menopausal and female climacteric states: Secondary | ICD-10-CM

## 2019-02-25 DIAGNOSIS — N952 Postmenopausal atrophic vaginitis: Secondary | ICD-10-CM | POA: Insufficient documentation

## 2019-02-25 DIAGNOSIS — K224 Dyskinesia of esophagus: Secondary | ICD-10-CM

## 2019-02-25 DIAGNOSIS — G620 Drug-induced polyneuropathy: Secondary | ICD-10-CM | POA: Diagnosis not present

## 2019-02-25 DIAGNOSIS — T451X5A Adverse effect of antineoplastic and immunosuppressive drugs, initial encounter: Secondary | ICD-10-CM

## 2019-02-25 DIAGNOSIS — R6881 Early satiety: Secondary | ICD-10-CM

## 2019-02-25 DIAGNOSIS — Z0000 Encounter for general adult medical examination without abnormal findings: Secondary | ICD-10-CM

## 2019-02-25 LAB — CBC WITH DIFFERENTIAL/PLATELET
Basophils Absolute: 0 10*3/uL (ref 0.0–0.1)
Basophils Relative: 0.5 % (ref 0.0–3.0)
Eosinophils Absolute: 0 10*3/uL (ref 0.0–0.7)
Eosinophils Relative: 1.1 % (ref 0.0–5.0)
HCT: 39.8 % (ref 36.0–46.0)
Hemoglobin: 12.9 g/dL (ref 12.0–15.0)
Lymphocytes Relative: 22.3 % (ref 12.0–46.0)
Lymphs Abs: 0.9 10*3/uL (ref 0.7–4.0)
MCHC: 32.5 g/dL (ref 30.0–36.0)
MCV: 84.9 fl (ref 78.0–100.0)
Monocytes Absolute: 0.4 10*3/uL (ref 0.1–1.0)
Monocytes Relative: 9.6 % (ref 3.0–12.0)
Neutro Abs: 2.8 10*3/uL (ref 1.4–7.7)
Neutrophils Relative %: 66.5 % (ref 43.0–77.0)
Platelets: 217 10*3/uL (ref 150.0–400.0)
RBC: 4.69 Mil/uL (ref 3.87–5.11)
RDW: 16.7 % — ABNORMAL HIGH (ref 11.5–15.5)
WBC: 4.2 10*3/uL (ref 4.0–10.5)

## 2019-02-25 LAB — COMPREHENSIVE METABOLIC PANEL
ALT: 14 U/L (ref 0–35)
AST: 16 U/L (ref 0–37)
Albumin: 4.4 g/dL (ref 3.5–5.2)
Alkaline Phosphatase: 60 U/L (ref 39–117)
BUN: 12 mg/dL (ref 6–23)
CO2: 31 mEq/L (ref 19–32)
Calcium: 9.8 mg/dL (ref 8.4–10.5)
Chloride: 101 mEq/L (ref 96–112)
Creatinine, Ser: 0.71 mg/dL (ref 0.40–1.20)
GFR: 102.23 mL/min (ref >60.00)
Glucose, Bld: 83 mg/dL (ref 70–99)
Potassium: 4.1 mEq/L (ref 3.5–5.1)
Sodium: 138 mEq/L (ref 135–145)
Total Bilirubin: 0.3 mg/dL (ref 0.2–1.2)
Total Protein: 7.6 g/dL (ref 6.0–8.3)

## 2019-02-25 LAB — LIPID PANEL
Cholesterol: 233 mg/dL — ABNORMAL HIGH (ref 0–200)
HDL: 64.7 mg/dL (ref >39.00)
LDL Cholesterol: 151 mg/dL — ABNORMAL HIGH (ref 0–99)
NonHDL: 168.23
Total CHOL/HDL Ratio: 4
Triglycerides: 85 mg/dL (ref 0.0–149.0)
VLDL: 17 mg/dL (ref 0.0–40.0)

## 2019-02-25 LAB — TSH: TSH: 1.83 u[IU]/mL (ref 0.35–4.50)

## 2019-02-25 NOTE — Patient Instructions (Addendum)
Please return in 6 months recheck sleep and neuropathy.  I will release your lab results to you on your MyChart account with further instructions. Please reply with any questions.   Take the gabapentin at least nightly. Add the lavender back to nighttime as well.  You may take up to 3 tablets of the trazodone if needed for sleep  If you have any questions or concerns, please don't hesitate to send me a message via MyChart or call the office at [phone]. Thank you for visiting with [place] today! It's our pleasure caring for you.  Please do these things to maintain good health!   Exercise at least 30-45 minutes a day,  4-5 days a week.   Eat a low-fat diet with lots of fruits and vegetables, up to 7-9 servings per day.  Drink plenty of water daily. Try to drink 8 8oz glasses per day.  Seatbelts can save your life. Always wear your seatbelt.  Place Smoke Detectors on every level of your home and check batteries every year.  Schedule an appointment with an eye doctor for an eye exam every 1-2 years  Safe sex - use condoms to protect yourself from STDs if you could be exposed to these types of infections. Use birth control if you do not want to become pregnant and are sexually active.  Avoid heavy alcohol use. If you drink, keep it to less than 2 drinks/day and not every day.  [place].  Choose someone you trust that could speak for you if you became unable to speak for yourself.  Depression is common in our stressful world.If you're feeling down or losing interest in things you normally enjoy, please come in for a visit.  If anyone is threatening or hurting you, please get help. Physical or Emotional Violence is never OK.    Insomnia Insomnia is a sleep disorder that makes it difficult to fall asleep or stay asleep. Insomnia can cause fatigue, low energy, difficulty concentrating, mood swings, and poor performance at work or school. There are three different ways  to classify insomnia:  Difficulty falling asleep.  Difficulty staying asleep.  Waking up too early in the morning. Any type of insomnia can be long-term (chronic) or short-term (acute). Both are common. Short-term insomnia usually lasts for three months or less. Chronic insomnia occurs at least three times a week for longer than three months. What are the causes? Insomnia may be caused by another condition, situation, or substance, such as:  Anxiety.  Certain medicines.  Gastroesophageal reflux disease (GERD) or other gastrointestinal conditions.  Asthma or other breathing conditions.  Restless legs syndrome, sleep apnea, or other sleep disorders.  Chronic pain.  Menopause.  Stroke.  Abuse of alcohol, tobacco, or illegal drugs.  Mental health conditions, such as depression.  Caffeine.  Neurological disorders, such as Alzheimer's disease.  An overactive thyroid (hyperthyroidism). Sometimes, the cause of insomnia may not be known. What increases the risk? Risk factors for insomnia include:  Gender. Women are affected more often than men.  Age. Insomnia is more common as you get older.  Stress.  Lack of exercise.  Irregular work schedule or working night shifts.  Traveling between different time zones.  Certain medical and mental health conditions. What are the signs or symptoms? If you have insomnia, the main symptom is having trouble falling asleep or having trouble staying asleep. This may lead to other symptoms, such as:  Feeling fatigued or having low energy.  Feeling nervous  about going to sleep.  Not feeling rested in the morning.  Having trouble concentrating.  Feeling irritable, anxious, or depressed. How is this diagnosed? This condition may be diagnosed based on:  Your symptoms and medical history. Your health care provider may ask about: ? Your sleep habits. ? Any medical conditions you have. ? Your mental health.  A physical  exam. How is this treated? Treatment for insomnia depends on the cause. Treatment may focus on treating an underlying condition that is causing insomnia. Treatment may also include:  Medicines to help you sleep.  Counseling or therapy.  Lifestyle adjustments to help you sleep better. Follow these instructions at home: Eating and drinking   Limit or avoid alcohol, caffeinated beverages, and cigarettes, especially close to bedtime. These can disrupt your sleep.  Do not eat a large meal or eat spicy foods right before bedtime. This can lead to digestive discomfort that can make it hard for you to sleep. Sleep habits   Keep a sleep diary to help you and your health care provider figure out what could be causing your insomnia. Write down: ? When you sleep. ? When you wake up during the night. ? How well you sleep. ? How rested you feel the next day. ? Any side effects of medicines you are taking. ? What you eat and drink.  Make your bedroom a dark, comfortable place where it is easy to fall asleep. ? Put up shades or blackout curtains to block light from outside. ? Use a white noise machine to block noise. ? Keep the temperature cool.  Limit screen use before bedtime. This includes: ? Watching TV. ? Using your smartphone, tablet, or computer.  Stick to a routine that includes going to bed and waking up at the same times every day and night. This can help you fall asleep faster. Consider making a quiet activity, such as reading, part of your nighttime routine.  Try to avoid taking naps during the day so that you sleep better at night.  Get out of bed if you are still awake after 15 minutes of trying to sleep. Keep the lights down, but try reading or doing a quiet activity. When you feel sleepy, go back to bed. General instructions  Take over-the-counter and prescription medicines only as told by your health care provider.  Exercise regularly, as told by your health care  provider. Avoid exercise starting several hours before bedtime.  Use relaxation techniques to manage stress. Ask your health care provider to suggest some techniques that may work well for you. These may include: ? Breathing exercises. ? Routines to release muscle tension. ? Visualizing peaceful scenes.  Make sure that you drive carefully. Avoid driving if you feel very sleepy.  Keep all follow-up visits as told by your health care provider. This is important. Contact a health care provider if:  You are tired throughout the day.  You have trouble in your daily routine due to sleepiness.  You continue to have sleep problems, or your sleep problems get worse. Get help right away if:  You have serious thoughts about hurting yourself or someone else. If you ever feel like you may hurt yourself or others, or have thoughts about taking your own life, get help right away. You can go to your nearest emergency department or call:  Your local emergency services (911 in the [place]  A suicide crisis helpline, such as the [facility] at 251 341 0934. This is open 24 hours a  day. Summary  Insomnia is a sleep disorder that makes it difficult to fall asleep or stay asleep.  Insomnia can be long-term (chronic) or short-term (acute).  Treatment for insomnia depends on the cause. Treatment may focus on treating an underlying condition that is causing insomnia.  Keep a sleep diary to help you and your health care provider figure out what could be causing your insomnia. This information is not intended to replace advice given to you by your health care provider. Make sure you discuss any questions you have with your health care provider. Document Revised: 12/15/2016 Document Reviewed: 10/12/2016 Elsevier Patient Education  2020 [employer].

## 2019-02-25 NOTE — Progress Notes (Signed)
Subjective  Chief Complaint  Patient presents with  . Annual Exam    not fasting. Trazodone is not working. Wakes up during the night    HPI: Misty Snyder is a 59 y.o. female who presents to [facility] [place] today for a Female Wellness Visit. She also has the concerns and/or needs as listed above in the chief complaint. These will be addressed in addition to the Health Maintenance Visit.   Wellness Visit: annual visit with health maintenance review and exam without Pap   HM: nonfasting. Has mammo scheduled. Will have colonoscopy with Dr. Fuller Plan next week. imms are up to date. Due hep c screen.  Chronic disease f/u and/or acute problem visit: (deemed necessary to be done in addition to the wellness visit):  Insomnia: ran out of lavendar root and has struggled with sleep over the last week. On trazodone. C/o hot flushes and restlessness.   Neuropathy: mild and using gabapentin intermittently.   Constipation, early satiety w/ h/o dysphagia and esophageal dysmotility and GERD on chronic PPI: has seen GI. Will have egd and colonoscopy next week for further eval. No weight loss. No severe pain. No melena. On n/v.  H/o breast cancer w/ reactive depression; cured w/ mastectomy. Has mammo scheduled. No further mood problems.   Vaginal itching due to atrophy: improved. Didn't need the estrace cream. No further discharge. Not currently sexually active. Postmenopausal w/ atrophy and hot flushes  Assessment  1. Annual physical exam   2. Atrophic vaginitis   3. Chemotherapy-induced peripheral neuropathy ([place])   4. Insomnia due to medical condition   5. Constipation, chronic   6. History of right breast cancer 2016   7. Esophageal dysmotility   8. Early satiety   9. Vasomotor symptoms due to menopause      Plan  Female Wellness Visit:  Age appropriate Health Maintenance and Prevention measures were discussed with patient. Included topics are cancer screening  recommendations, ways to keep healthy (see AVS) including dietary and exercise recommendations, regular eye and dental care, use of seat belts, and avoidance of moderate alcohol use and tobacco use. mammo and colonoscopy scheduled.   BMI: discussed patient's BMI and encouraged positive lifestyle modifications to help get to or maintain a target BMI.  HM needs and immunizations were addressed and ordered. See below for orders. See HM and immunization section for updates. utd  Routine labs and screening tests ordered including cmp, cbc and lipids where appropriate.   Discussed recommendations regarding Vit D and calcium supplementation (see AVS)  Chronic disease management visit and/or acute problem visit:  Postmenopausal vaginal atrophy and hot flushes: rec gabapentin nightly. Education given. May use estrogen cream prn if sxs return.   Insomnia: multifactorial: vasomotor and reactive; to restart lavendar in addition to trazodone. Sleep hygiene discussed. See avs. Use gabapentin for night sweats as well.   Chronic constipation: for colonoscopy  Early satiety: on PPI. For EGD. Await results. No red flag sxs.   H/o breast cancer. Recovered well.   Chemotherapy induced neuropathy: mild. Can increase gabapentin dose if needed if worsens.   Follow up: Return in about 6 months (around 08/25/2019) for recheck sleep and neuropathy.  Orders Placed This Encounter  Procedures  . CBC with Differential/Platelet  . Comprehensive metabolic panel  . Lipid panel  . TSH  . Hepatitis C antibody   No orders of the defined types were placed in this encounter.     Lifestyle: Body mass index is 32.41  kg/m. Wt Readings from Last 3 Encounters:  02/25/19 177 lb 3.2 oz (80.4 kg)  02/14/19 176 lb (79.8 kg)  01/27/19 174 lb 12.8 oz (79.3 kg)    Patient Active Problem List   Diagnosis Date Noted  . Chemotherapy-induced peripheral neuropathy ([place]) 10/21/2014    Priority: High  . History of right  breast cancer 2016 05/29/2014    Priority: High  . Esophageal dysmotility 06/14/2016    Priority: Medium  . Constipation, chronic 04/29/2015    Priority: Medium  . 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 Uterine fibroid 11/19/2008    Priority: Medium  . Atrophic vaginitis 02/25/2019    Priority: Low  . Vasomotor symptoms due to menopause 02/25/2019    Priority: Low  . Genetic testing 06/25/2014    Negative; No pathogenic mutations BreastNext (17 genes) @ [facility]: ATM, BARD1, BRCA1, BRCA2, BRIP1, CDH1, CHEK2, MRE11A, MUTYH, NBN, NF1, PALB2, PTEN, RAD50, RAD51C, RAD51D, and TP53. Tested 06/03/14    Health Maintenance  Topic Date Due  . Hepatitis C Screening  06-02-1960  . COLONOSCOPY  01/07/2019  . MAMMOGRAM  03/01/2019  . TETANUS/TDAP  10/09/2019  . PAP SMEAR-Modifier  06/14/2021  . INFLUENZA VACCINE  Completed  . HIV Screening  Discontinued   Immunization History  Administered Date(s) Administered  . Influenza Whole 11/19/2008, 10/08/2009  . Influenza,inj,Quad PF,6+ Mos 10/16/2013, 12/02/2014, 10/24/2018  . Influenza-Unspecified 11/09/2015  . Pneumococcal Polysaccharide-23 02/16/2014  . Td 10/08/2009   We updated and reviewed the patient's past history in detail and it is documented below. Allergies: Patient has No Known Allergies. Past Medical History Patient  has a past medical history of Breast cancer ([place]) (05/15/14), Depression, Personal history of chemotherapy, and Uterine fibroid. Past Surgical History Patient  has a past surgical history that includes Neck surgery; Mastectomy w/ sentinel node biopsy (Right, 06/18/2014); Insertion central venous access device w/ subcutaneous port (06/18/2014); Mastectomy w/ sentinel node biopsy (Right, 06/17/2014); Portacath placement (Left, 06/17/2014); and Mastectomy (Right, 2016). Family History: Patient family history includes Alcohol abuse in an other family member; Prostate cancer in her maternal  grandfather. Social History:  Patient  reports that she has never smoked. She has never used smokeless tobacco. She reports that she does not drink alcohol or use drugs.  Review of Systems: Constitutional: negative for fever or malaise Ophthalmic: negative for photophobia, double vision or loss of vision Cardiovascular: negative for chest pain, dyspnea on exertion, or new LE swelling Respiratory: negative for SOB or persistent cough Gastrointestinal: negative for abdominal pain, change in bowel habits or melena Genitourinary: negative for dysuria or gross hematuria, no abnormal uterine bleeding or disharge Musculoskeletal: negative for new gait disturbance or muscular weakness Integumentary: negative for new or persistent rashes, no breast lumps Neurological: negative for TIA or stroke symptoms Psychiatric: negative for SI or delusions Allergic/Immunologic: negative for hives  Patient Care Team    Relationship Specialty Notifications Start End  Leamon Arnt, MD PCP - General Family Medicine  10/24/18   Nicholas Lose, MD Consulting Physician Hematology and Oncology  12/23/14   Donnie Mesa, MD Consulting Physician General Surgery  12/23/14   Sylvan Cheese, NP Nurse Practitioner Hematology and Oncology  12/23/14    Comment: Survivorship  Ladene Artist, MD Consulting Physician Gastroenterology  10/24/18     Objective  Vitals: BP 128/78 (BP Location: Left Arm, Patient Position: Sitting, Cuff Size: Normal)   Pulse 72   Temp 98.5 F (36.9 C) (Temporal)  Ht '5\' 2"'  (1.575 m)   Wt 177 lb 3.2 oz (80.4 kg)   LMP 06/28/2011   SpO2 98%   BMI 32.41 kg/m  General:  Well developed, well nourished, no acute distress  Psych:  Alert and orientedx3,normal mood and affect HEENT:  Normocephalic, atraumatic, non-icteric sclera, PERRL, oropharynx is clear without mass or exudate, supple neck without adenopathy, mass or thyromegaly Cardiovascular:  Normal S1, S2, RRR without gallop, rub  or murmur, nondisplaced PMI Respiratory:  Good breath sounds bilaterally, CTAB with normal respiratory effort Gastrointestinal: normal bowel sounds, soft, non-tender, no noted masses. No HSM MSK: no deformities, contusions. Joints are without erythema or swelling. Spine and CVA region are nontender Skin:  Warm, no rashes or suspicious lesions noted Neurologic:    Mental status is normal. CN 2-11 are normal. Gross motor and sensory exams are normal. Normal gait. No tremor Breast Exam: No mass, skin retraction or nipple discharge is appreciated in left breast. No axillary adenopathy. Fibrocystic changes are not noted. S/p right mastectomy. No masses on chest wall appreciated    Commons side effects, risks, benefits, and alternatives for medications and treatment plan prescribed today were discussed, and the patient expressed understanding of the given instructions. Patient is instructed to call or message via MyChart if he/she has any questions or concerns regarding our treatment plan. No barriers to understanding were identified. We discussed Red Flag symptoms and signs in detail. Patient expressed understanding regarding what to do in case of urgent or emergency type symptoms.   Medication list was reconciled, printed and provided to the patient in AVS. Patient instructions and summary information was reviewed with the patient as documented in the AVS. This note was prepared with assistance of Dragon voice recognition software. Occasional wrong-word or sound-a-like substitutions may have occurred due to the inherent limitations of voice recognition software  This visit occurred during the SARS-CoV-2 public health emergency.  Safety protocols were in place, including screening questions prior to the visit, additional usage of staff PPE, and extensive cleaning of exam room while observing appropriate contact time as indicated for disinfecting solutions.

## 2019-02-26 LAB — HEPATITIS C ANTIBODY
Hepatitis C Ab: NONREACTIVE
SIGNAL TO CUT-OFF: 0.04 (ref <1.00)

## 2019-02-27 ENCOUNTER — Ambulatory Visit (INDEPENDENT_AMBULATORY_CARE_PROVIDER_SITE_OTHER): Payer: BC Managed Care – PPO

## 2019-02-27 ENCOUNTER — Other Ambulatory Visit: Payer: Self-pay | Admitting: Gastroenterology

## 2019-02-27 DIAGNOSIS — Z1159 Encounter for screening for other viral diseases: Secondary | ICD-10-CM

## 2019-02-28 LAB — SARS CORONAVIRUS 2 (TAT 6-24 HRS): SARS Coronavirus 2: NEGATIVE

## 2019-03-03 ENCOUNTER — Encounter: Payer: Self-pay | Admitting: Family Medicine

## 2019-03-03 ENCOUNTER — Other Ambulatory Visit: Payer: Self-pay

## 2019-03-03 ENCOUNTER — Ambulatory Visit (AMBULATORY_SURGERY_CENTER): Payer: BC Managed Care – PPO | Admitting: Gastroenterology

## 2019-03-03 ENCOUNTER — Encounter: Payer: Self-pay | Admitting: Gastroenterology

## 2019-03-03 VITALS — BP 121/64 | HR 83 | Temp 96.8°F | Resp 11 | Ht 63.0 in | Wt 176.0 lb

## 2019-03-03 DIAGNOSIS — D124 Benign neoplasm of descending colon: Secondary | ICD-10-CM

## 2019-03-03 DIAGNOSIS — K2950 Unspecified chronic gastritis without bleeding: Secondary | ICD-10-CM

## 2019-03-03 DIAGNOSIS — K2100 Gastro-esophageal reflux disease with esophagitis, without bleeding: Secondary | ICD-10-CM | POA: Diagnosis not present

## 2019-03-03 DIAGNOSIS — D126 Benign neoplasm of colon, unspecified: Secondary | ICD-10-CM

## 2019-03-03 DIAGNOSIS — R1310 Dysphagia, unspecified: Secondary | ICD-10-CM | POA: Diagnosis not present

## 2019-03-03 DIAGNOSIS — Z1211 Encounter for screening for malignant neoplasm of colon: Secondary | ICD-10-CM | POA: Diagnosis not present

## 2019-03-03 DIAGNOSIS — D369 Benign neoplasm, unspecified site: Secondary | ICD-10-CM | POA: Insufficient documentation

## 2019-03-03 DIAGNOSIS — K5909 Other constipation: Secondary | ICD-10-CM

## 2019-03-03 DIAGNOSIS — K317 Polyp of stomach and duodenum: Secondary | ICD-10-CM | POA: Diagnosis not present

## 2019-03-03 DIAGNOSIS — K3189 Other diseases of stomach and duodenum: Secondary | ICD-10-CM

## 2019-03-03 DIAGNOSIS — K219 Gastro-esophageal reflux disease without esophagitis: Secondary | ICD-10-CM

## 2019-03-03 HISTORY — DX: Gastro-esophageal reflux disease with esophagitis, without bleeding: K21.00

## 2019-03-03 HISTORY — DX: Benign neoplasm of colon, unspecified: D12.6

## 2019-03-03 MED ORDER — SODIUM CHLORIDE 0.9 % IV SOLN: 500.0000 mL | Freq: Once | INTRAVENOUS | Status: DC

## 2019-03-03 NOTE — Op Note (Signed)
[place] Patient Name: Misty Snyder Procedure Date: 03/03/2019 2:17 PM MRN: [id number] Endoscopist: Ladene Artist , MD Age: 59 Referring MD:  Date of Birth: 1960-03-06 Gender: Female Account #: [id number] Procedure:                Upper GI endoscopy Indications:              Dysphagia, Gastroesophageal reflux disease Medicines:                Monitored Anesthesia Care Procedure:                Pre-Anesthesia Assessment:                           - Prior to the procedure, a History and Physical                            was performed, and patient medications and                            allergies were reviewed. The patient's tolerance of                            previous anesthesia was also reviewed. The risks                            and benefits of the procedure and the sedation                            options and risks were discussed with the patient.                            All questions were answered, and informed consent                            was obtained. Prior Anticoagulants: The patient has                            taken no previous anticoagulant or antiplatelet                            agents. ASA Grade Assessment: II - A patient with                            mild systemic disease. After reviewing the risks                            and benefits, the patient was deemed in                            satisfactory condition to undergo the procedure.                           After obtaining informed consent, the endoscope was  passed under direct vision. Throughout the                            procedure, the patient's blood pressure, pulse, and                            oxygen saturations were monitored continuously. The                            Endoscope was introduced through the mouth, and                            advanced to the second part of duodenum. The upper                            GI  endoscopy was accomplished without difficulty.                            The patient tolerated the procedure well. Scope In: Scope Out: Findings:                 Esophagitis, carditis with no bleeding was found at                            the gastroesophageal junction. Biopsies were taken                            with a cold forceps for histology.                           No endoscopic abnormality was evident in the                            esophagus to explain the patient's complaint of                            dysphagia. It was decided, however, to proceed with                            dilation of the entire esophagus. A guidewire was                            placed and the scope was withdrawn. Dilation was                            performed with a Savary dilator with no resistance                            at 16 mm. Small heme likely due to prior EGJ                            biopsies.  The exam of the esophagus was otherwise normal.                           Multiple 3 to 4 mm sessile polyps with no bleeding                            and no stigmata of recent bleeding were found in                            the gastric fundus and in the gastric body.                            Biopsies were taken with a cold forceps for                            histology.                           The exam of the stomach was otherwise normal.                           Diffuse granular, dark speckled mucosa was found in                            the second portion of the duodenum. R/O                            pseudomelanosis. Biopsies were taken with a cold                            forceps for histology.                           The duodenal bulb was normal. Complications:            No immediate complications. Estimated Blood Loss:     Estimated blood loss was minimal. Impression:               - Reflux esophagitis with no bleeding. Biopsied.                            - No endoscopic esophageal abnormality to explain                            patient's dysphagia. Esophagus dilated.                           - Multiple gastric polyps. Biopsied.                           - Granular, darkend, speckled mucosa in the second                            portion of the duodenum. Biopsied.                           -  Normal duodenal bulb. Recommendation:           - Patient has a contact number available for                            emergencies. The signs and symptoms of potential                            delayed complications were discussed with the                            patient. Return to normal activities tomorrow.                            Written discharge instructions were provided to the                            patient.                           - Clear liquid diet for 2 hours, then advance as                            tolerated to soft diet today.                           - Resume prior diet tomorrow.                           - Antireflux measures long term.                           - Continue present medications.                           - Await pathology results.                           - Return to GI office in 6 weeks. Ladene Artist, MD 03/03/2019 3:01:56 PM This report has been signed electronically.

## 2019-03-03 NOTE — Progress Notes (Signed)
Called to room to assist during endoscopic procedure.  Patient ID and intended procedure confirmed with present staff. Received instructions for my participation in the procedure from the performing physician.  

## 2019-03-03 NOTE — Patient Instructions (Addendum)
No aspirin, ibuprofen, naproxen, or other non-steriodal anti-inflammatory drugs for 2 weeks after polyp removal.   Handouts provided on polyps, diverticulosis, hemorrhoids and high-fiber diet. Also provided handouts on post dilation diet, esophagitis and anti-reflux regimen.    YOU HAD AN ENDOSCOPIC PROCEDURE TODAY AT [facility]:   Refer to the procedure report that was given to you for any specific questions about what was found during the examination.  If the procedure report does not answer your questions, please call your gastroenterologist to clarify.  If you requested that your care partner not be given the details of your procedure findings, then the procedure report has been included in a sealed envelope for you to review at your convenience later.  YOU SHOULD EXPECT: Some feelings of bloating in the abdomen. Passage of more gas than usual.  Walking can help get rid of the air that was put into your GI tract during the procedure and reduce the bloating. If you had a lower endoscopy (such as a colonoscopy or flexible sigmoidoscopy) you may notice spotting of blood in your stool or on the toilet paper. If you underwent a bowel prep for your procedure, you may not have a normal bowel movement for a few days.  Please Note:  You might notice some irritation and congestion in your nose or some drainage.  This is from the oxygen used during your procedure.  There is no need for concern and it should clear up in a day or so.  SYMPTOMS TO REPORT IMMEDIATELY:   Following lower endoscopy (colonoscopy or flexible sigmoidoscopy):  Excessive amounts of blood in the stool  Significant tenderness or worsening of abdominal pains  Swelling of the abdomen that is new, acute  Fever of 100F or higher   Following upper endoscopy (EGD)  Vomiting of blood or coffee ground material  New chest pain or pain under the shoulder blades  Painful or persistently difficult swallowing  New shortness  of breath  Fever of 100F or higher  Black, tarry-looking stools  For urgent or emergent issues, a gastroenterologist can be reached at any hour by calling [phone].   DIET:  Clear liquids for 2 hours (until 5pm), then a soft diet for the rest of today. -See handout.  Drink plenty of fluids but you should avoid alcoholic beverages for 24 hours.  ACTIVITY:  You should plan to take it easy for the rest of today and you should NOT DRIVE or use heavy machinery until tomorrow (because of the sedation medicines used during the test).    FOLLOW UP: Our staff will call the number listed on your records 48-72 hours following your procedure to check on you and address any questions or concerns that you may have regarding the information given to you following your procedure. If we do not reach you, we will leave a message.  We will attempt to reach you two times.  During this call, we will ask if you have developed any symptoms of COVID 19. If you develop any symptoms (ie: fever, flu-like symptoms, shortness of breath, cough etc.) before then, please call [phone].  If you test positive for Covid 19 in the 2 weeks post procedure, please call and report this information to [place].    If any biopsies were taken you will be contacted by phone or by letter within the next 1-3 weeks.  Please call us at [phone] if you have not heard about the biopsies in 3 weeks.  SIGNATURES/CONFIDENTIALITY: You and/or your care partner have signed paperwork which will be entered into your electronic medical record.  These signatures attest to the fact that that the information above on your After Visit Summary has been reviewed and is understood.  Full responsibility of the confidentiality of this discharge information lies with you and/or your care-partner.

## 2019-03-03 NOTE — Op Note (Signed)
[place] Patient Name: Misty Snyder Procedure Date: 03/03/2019 2:17 PM MRN: [id number] Endoscopist: Ladene Artist , MD Age: 59 Referring MD:  Date of Birth: 09-11-1960 Gender: Female Account #: [id number] Procedure:                Colonoscopy Indications:              Screening for colorectal malignant neoplasm Medicines:                Monitored Anesthesia Care Procedure:                Pre-Anesthesia Assessment:                           - Prior to the procedure, a History and Physical                            was performed, and patient medications and                            allergies were reviewed. The patient's tolerance of                            previous anesthesia was also reviewed. The risks                            and benefits of the procedure and the sedation                            options and risks were discussed with the patient.                            All questions were answered, and informed consent                            was obtained. Prior Anticoagulants: The patient has                            taken no previous anticoagulant or antiplatelet                            agents. ASA Grade Assessment: II - A patient with                            mild systemic disease. After reviewing the risks                            and benefits, the patient was deemed in                            satisfactory condition to undergo the procedure.                           After obtaining informed consent, the colonoscope  was passed under direct vision. Throughout the                            procedure, the patient's blood pressure, pulse, and                            oxygen saturations were monitored continuously. The                            Colonoscope was introduced through the anus and                            advanced to the the cecum, identified by                            appendiceal orifice  and ileocecal valve. The                            ileocecal valve, appendiceal orifice, and rectum                            were photographed. The quality of the bowel                            preparation was good. The colonoscopy was performed                            without difficulty. The patient tolerated the                            procedure well. Scope In: 2:20:19 PM Scope Out: 2:32:24 PM Scope Withdrawal Time: 0 hours 10 minutes 6 seconds  Total Procedure Duration: 0 hours 12 minutes 5 seconds  Findings:                 The perianal and digital rectal examinations were                            normal.                           A 9 mm polyp was found in the descending colon. The                            polyp was pedunculated. The polyp was removed with                            a hot snare. Resection and retrieval were complete.                           A few small-mouthed diverticula were found in the                            left colon.  Internal hemorrhoids were found during                            retroflexion. The hemorrhoids were moderate and                            Grade I (internal hemorrhoids that do not prolapse).                           The exam was otherwise without abnormality on                            direct and retroflexion views. Complications:            No immediate complications. Estimated blood loss:                            None. Estimated Blood Loss:     Estimated blood loss: none. Impression:               - One 9 mm polyp in the descending colon, removed                            with a hot snare. Resected and retrieved.                           - Mild diverticulosis in the left colon.                           - Internal hemorrhoids.                           - The examination was otherwise normal on direct                            and retroflexion views. Recommendation:           - Repeat  colonoscopy after studies are complete for                            surveillance based on pathology results.                           - Patient has a contact number available for                            emergencies. The signs and symptoms of potential                            delayed complications were discussed with the                            patient. Return to normal activities tomorrow.                            Written discharge instructions were provided to the  patient.                           - High fiber diet.                           - Continue present medications.                           - Await pathology results.                           - No aspirin, ibuprofen, naproxen, or other                            non-steroidal anti-inflammatory drugs for 2 weeks                            after polyp removal. Ladene Artist, MD 03/03/2019 2:35:37 PM This report has been signed electronically.

## 2019-03-03 NOTE — Progress Notes (Signed)
To PACU, VSS. Report to [email or web address] 

## 2019-03-05 ENCOUNTER — Telehealth: Payer: Self-pay

## 2019-03-05 ENCOUNTER — Telehealth: Payer: Self-pay | Admitting: *Deleted

## 2019-03-05 NOTE — Telephone Encounter (Signed)
Attempted to reach patient for post-procedure f/u call. No answer. Left message for her to please not hesitate to call us if she has any questions/concerns regarding her care. 

## 2019-03-05 NOTE — Telephone Encounter (Signed)
Second follow up call attempt.  Message left on vm.

## 2019-03-06 ENCOUNTER — Ambulatory Visit: Payer: BC Managed Care – PPO

## 2019-03-07 ENCOUNTER — Encounter: Payer: Self-pay | Admitting: Gastroenterology

## 2019-03-12 ENCOUNTER — Encounter: Payer: Self-pay | Admitting: Family Medicine

## 2019-03-19 ENCOUNTER — Inpatient Hospital Stay: Payer: BC Managed Care – PPO | Attending: Hematology and Oncology | Admitting: Hematology and Oncology

## 2019-03-19 ENCOUNTER — Other Ambulatory Visit: Payer: Self-pay

## 2019-03-19 DIAGNOSIS — Z9011 Acquired absence of right breast and nipple: Secondary | ICD-10-CM | POA: Insufficient documentation

## 2019-03-19 DIAGNOSIS — Z853 Personal history of malignant neoplasm of breast: Secondary | ICD-10-CM | POA: Diagnosis present

## 2019-03-19 DIAGNOSIS — Z9221 Personal history of antineoplastic chemotherapy: Secondary | ICD-10-CM | POA: Diagnosis not present

## 2019-03-19 NOTE — Assessment & Plan Note (Signed)
Right breast mastectomy 06/17/2014: IDC, 3.6 cm, grade 3, 0/4 lymph nodes, T2 N0 M0 stage II a, ER 0%, PR 0%, HER-2 negative, Ki-67 90% Adjuvant chemotherapy with dose dense Adriamycin and Cytoxan 4 followed by Abraxane weekly 12 from 07/22/2014 to 12/02/2014  Breast Cancer Surveillance: 1. Breast exam3/03/2019: Normal Breast exam on the left breast. Right chest wall scar without any abnormalities. 2. Mammogram : Next mammogram scheduled for 04/03/2018  Patient is excited to go to [place] in October 2020. Patient will follow with her primary care physician from next year onwards.

## 2019-03-19 NOTE — Progress Notes (Signed)
Patient Care Team: Leamon Arnt, MD as PCP - General (Family Medicine) Nicholas Lose, MD as Consulting Physician (Hematology and Oncology) Donnie Mesa, MD as Consulting Physician (General Surgery) Sylvan Cheese, NP as Nurse Practitioner (Hematology and Oncology) Ladene Artist, MD as Consulting Physician (Gastroenterology)  DIAGNOSIS:    ICD-10-CM   1. History of right breast cancer 2016  Z85.3     SUMMARY OF ONCOLOGIC HISTORY: Oncology History  History of right breast cancer 2016  05/15/2014 Mammogram   Right breast: 2.5 cm mass noted at 10:00   05/15/2014 Breast US   Right breast: irregular hypoechoic mass at 10 o'clock,8 cm from the nipple measuring 2.6 x 1.4 x 2 cm. Right axillary LN with a mildly thickened cortex is measures 1.3 x 0.9 x 1 cm.   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   06/02/2014 Clinical Stage   Stage IIA: T2 N0   06/03/2014 Procedure   Genetic testing: BreastNext panel Cephus Shelling) reveals no clinically significant variant at ATM, BARD1, BRCA1, BRCA2, BRIP1, CDH1, CHEK2, MRE11A, MUTYH, NBN, NF1, PALB2, PTEN, RAD50, RAD51C, RAD51D, and TP53   06/17/2014 Definitive Surgery   Right breast mastectomy: IDC, 3.6 cm, grade 3, 0/4 lymph nodes,  ER 0%, PR 0%, HER-2 negative, Ki-67 90%   06/17/2014 Pathologic Stage   Stage IIA: pT2 pN0   07/22/2014 - 12/02/2014 Adjuvant Chemotherapy   Adjuvant chemotherapy with dose dense Adriamycin and Cytoxan 4 followed by Abraxane weekly 12   12/31/2014 Survivorship   Survivorship care plan completed and mailed to patient in lieu of in  person visit.     CHIEF COMPLIANT: Surveillance of breast cancer  INTERVAL HISTORY: Misty Snyder is a 59 y.o. with above-mentioned history of right breast cancer who underwent a right mastectomy, adjuvant chemotherapy, and is currently on surveillance. She presents to the clinic today for annual follow-up.     ALLERGIES:  has No Known Allergies.  MEDICATIONS:  Current Outpatient Medications  Medication Sig Dispense Refill  . estradiol (ESTRACE) 0.1 MG/GM vaginal cream Place 1 Applicatorful vaginally 3 (three) times a week. (Patient not taking: Reported on 02/25/2019) 42.5 g 5  . gabapentin (NEURONTIN) 300 MG capsule Take 1 capsule (300 mg total) by mouth 2 (two) times daily. 60 capsule 2  . naproxen (NAPROSYN) 500 MG tablet Take 1 tablet (500 mg total) by mouth 2 (two) times daily with a meal. 120 tablet 5  . pantoprazole (PROTONIX) 40 MG tablet Take 1 tablet (40 mg total) by mouth daily. 30 tablet 11  . traZODone (DESYREL) 50 MG tablet Take 2 tablets (100 mg total) by mouth at bedtime as needed for sleep. 180 tablet 3   No current facility-administered medications for this visit.    PHYSICAL EXAMINATION: ECOG PERFORMANCE STATUS: 1 - Symptomatic but completely ambulatory  There were no vitals filed for this visit. There were no vitals filed for this visit.  BREAST: No palpable masses or nodules in either right or left breasts. No palpable axillary supraclavicular or infraclavicular adenopathy no breast tenderness or nipple discharge. (exam performed in  the presence of a chaperone)  LABORATORY DATA:  I have reviewed the data as listed CMP Latest Ref Rng & Units 02/25/2019 02/16/2016 04/29/2015  Glucose 70 - 99 mg/dL 83 99 90  BUN 6 - 23 mg/dL '12 11 9  ' Creatinine 0.40 - 1.20 mg/dL 0.71 0.69 0.72  Sodium 135 - 145 mEq/L 138 142 142  Potassium 3.5 - 5.1 mEq/L 4.1 3.8 4.0  Chloride 96 - 112 mEq/L 101 108 105  CO2 19 - 32 mEq/L '31 29 25  ' Calcium 8.4 - 10.5 mg/dL 9.8 9.0 9.7  Total  Protein 6.0 - 8.3 g/dL 7.6 7.0 -  Total Bilirubin 0.2 - 1.2 mg/dL 0.3 0.4 -  Alkaline Phos 39 - 117 U/L 60 57 -  AST 0 - 37 U/L 16 15 -  ALT 0 - 35 U/L 14 12 -    Lab Results  Component Value Date   WBC 4.2 02/25/2019   HGB 12.9 02/25/2019   HCT 39.8 02/25/2019   MCV 84.9 02/25/2019   PLT 217.0 02/25/2019   NEUTROABS 2.8 02/25/2019    ASSESSMENT & PLAN:  History of right breast cancer 2016 Right breast mastectomy 06/17/2014: IDC, 3.6 cm, grade 3, 0/4 lymph nodes, T2 N0 M0 stage II a, ER 0%, PR 0%, HER-2 negative, Ki-67 90% Adjuvant chemotherapy with dose dense Adriamycin and Cytoxan 4 followed by Abraxane weekly 12 from 07/22/2014 to 12/02/2014  Breast Cancer Surveillance: 1. Breast exam3/03/2019: Normal Breast exam on the left breast. Right chest wall scar without any abnormalities. 2. Mammogram : Next mammogram scheduled for 04/03/2018  Patient will follow with her primary care physician from next year onwards.    No orders of the defined types were placed in this encounter.  The patient has a good understanding of the overall plan. she agrees with it. she will call with any problems that may develop before the next visit here.  Total time spent: 20 mins including face to face time and time spent for planning, charting and coordination of care  Nicholas Lose, MD 03/19/2019  I, Cloyde Reams Dorshimer, am acting as scribe for Dr. Nicholas Lose.  I have reviewed the above documentation for accuracy and completeness, and I agree with the above.

## 2019-04-03 ENCOUNTER — Ambulatory Visit: Payer: BC Managed Care – PPO

## 2019-04-15 ENCOUNTER — Ambulatory Visit: Payer: BC Managed Care – PPO | Admitting: Gastroenterology

## 2019-05-12 ENCOUNTER — Encounter: Payer: Self-pay | Admitting: Gastroenterology

## 2019-05-12 ENCOUNTER — Ambulatory Visit (INDEPENDENT_AMBULATORY_CARE_PROVIDER_SITE_OTHER): Payer: BC Managed Care – PPO | Admitting: Gastroenterology

## 2019-05-12 VITALS — BP 126/76 | HR 84 | Temp 97.4°F | Ht 63.0 in | Wt 176.1 lb

## 2019-05-12 DIAGNOSIS — Z86010 Personal history of colonic polyps: Secondary | ICD-10-CM

## 2019-05-12 DIAGNOSIS — K219 Gastro-esophageal reflux disease without esophagitis: Secondary | ICD-10-CM

## 2019-05-12 MED ORDER — OMEPRAZOLE 40 MG PO CPDR: 40.0000 mg | DELAYED_RELEASE_CAPSULE | Freq: Two times a day (BID) | ORAL | 11 refills | Status: DC

## 2019-05-12 NOTE — Progress Notes (Signed)
    History of Present Illness: This is a 59 year old female returning for follow-up of GERD and dysphagia.  She relates persistent postprandial heartburn and frequent solid food dysphagia.  Symptoms have not improved since EGD.  Mild constipation is controlled with MiraLAX.  She notes frequent postprandial bloating as well.  EGD 02/2019 - Reflux esophagitis with no bleeding. Biopsied. - No endoscopic esophageal abnormality to explain patient's dysphagia. Esophagus dilated. - Multiple gastric polyps. Biopsied. - Granular, darkend, speckled mucosa in the second portion of the duodenum. Biopsied. - Normal duodenal bulb.  Colonoscopy 02/2019 - One 9 mm polyp in the descending colon, removed with a hot snare. Resected and retrieved. - Mild diverticulosis in the left colon. - Internal hemorrhoids. - The examination was otherwise normal on direct and retroflexion views.  Current Medications, Allergies, Past Medical History, Past Surgical History, Family History and Social History were reviewed in [employer] record.   Physical Exam: General: Well developed, well nourished, no acute distress Head: Normocephalic and atraumatic Eyes:  sclerae anicteric, EOMI Ears: Normal auditory acuity Mouth: Not examined, mask on during Covid-19 pandemic Lungs: Clear throughout to auscultation Heart: Regular rate and rhythm; no murmurs, rubs or bruits Abdomen: Soft, non tender and non distended. No masses, hepatosplenomegaly or hernias noted. Normal Bowel sounds Rectal: Not done Musculoskeletal: Symmetrical with no gross deformities  Pulses:  Normal pulses noted Extremities: No clubbing, cyanosis, edema or deformities noted Neurological: Alert oriented x 4, grossly nonfocal Psychological:  Alert and cooperative. Normal mood and affect   Assessment and Recommendations:  1. GERD, dysphagia, bloating.  Mild gastritis.  Mild reactive duodenopathy.  Schedule barium esophagram with  tablet.  Closely follow antireflux measures and change to omeprazole 40 mg bid taken 30 minutes before breakfast and dinner. REV 6 weeks.   2.  Mild constipation.  Continue MiraLAX daily.  3.  Personal history of adenomatous colon polyps.  A 7-year interval surveillance colonoscopy is recommended in February 2028.

## 2019-05-12 NOTE — Patient Instructions (Signed)
Stop taking pantoprazole.   We have sent the following medications to your pharmacy for you to pick up at your convenience: omeprazole 40 mg twice daily.   You have been scheduled for a Barium Esophogram at [facility] (1st floor of the hospital) on 05/23/19 at 10:30am. Please arrive 15 minutes prior to your appointment for registration. Make certain not to have anything to eat or drink 3 hours prior to your test. If you need to reschedule for any reason, please contact radiology at [phone] to do so. __________________________________________________________________ A barium swallow is an examination that concentrates on views of the esophagus. This tends to be a double contrast exam (barium and two liquids which, when combined, create a gas to distend the wall of the oesophagus) or single contrast (non-ionic iodine based). The study is usually tailored to your symptoms so a good history is essential. Attention is paid during the study to the form, structure and configuration of the esophagus, looking for functional disorders (such as aspiration, dysphagia, achalasia, motility and reflux) EXAMINATION You may be asked to change into a gown, depending on the type of swallow being performed. A radiologist and radiographer will perform the procedure. The radiologist will advise you of the type of contrast selected for your procedure and direct you during the exam. You will be asked to stand, sit or lie in several different positions and to hold a small amount of fluid in your mouth before being asked to swallow while the imaging is performed .In some instances you may be asked to swallow barium coated marshmallows to assess the motility of a solid food bolus. The exam can be recorded as a digital or video fluoroscopy procedure. POST PROCEDURE It will take 1-2 days for the barium to pass through your system. To facilitate this, it is important, unless otherwise directed, to increase your fluids  for the next 24-48hrs and to resume your normal diet.  This test typically takes about 30 minutes to perform. _________________________________________________________________  Due to recent changes in healthcare laws, you may see the results of your imaging and laboratory studies on MyChart before your provider has had a chance to review them.  We understand that in some cases there may be results that are confusing or concerning to you. Not all laboratory results come back in the same time frame and the provider may be waiting for multiple results in order to interpret others.  Please give [place] 48 hours in order for your provider to thoroughly review all the results before contacting the office for clarification of your results.   Thank you for choosing me and [facility].  Pricilla Riffle. Dagoberto Ligas., MD., Marval Regal

## 2019-05-23 ENCOUNTER — Ambulatory Visit (HOSPITAL_COMMUNITY): Payer: BC Managed Care – PPO

## 2019-05-26 ENCOUNTER — Ambulatory Visit
Admission: RE | Admit: 2019-05-26 | Discharge: 2019-05-26 | Disposition: A | Discharge status: Home / Self Care | Payer: BC Managed Care – PPO | Source: Ambulatory Visit | Attending: Hematology and Oncology | Admitting: Hematology and Oncology

## 2019-05-26 ENCOUNTER — Other Ambulatory Visit: Payer: Self-pay

## 2019-05-26 DIAGNOSIS — Z1231 Encounter for screening mammogram for malignant neoplasm of breast: Secondary | ICD-10-CM

## 2019-07-03 ENCOUNTER — Ambulatory Visit: Payer: BC Managed Care – PPO | Admitting: Gastroenterology

## 2019-07-04 ENCOUNTER — Other Ambulatory Visit: Payer: Self-pay | Admitting: Family Medicine

## 2019-07-04 DIAGNOSIS — T451X5A Adverse effect of antineoplastic and immunosuppressive drugs, initial encounter: Secondary | ICD-10-CM

## 2019-08-25 ENCOUNTER — Ambulatory Visit: Payer: BC Managed Care – PPO | Admitting: Family Medicine

## 2019-09-19 ENCOUNTER — Telehealth: Payer: Self-pay | Admitting: Family Medicine

## 2019-09-19 ENCOUNTER — Ambulatory Visit: Payer: BC Managed Care – PPO | Admitting: Family Medicine

## 2019-09-19 DIAGNOSIS — T451X5A Adverse effect of antineoplastic and immunosuppressive drugs, initial encounter: Secondary | ICD-10-CM

## 2019-09-19 MED ORDER — GABAPENTIN 300 MG PO CAPS: 300.0000 mg | ORAL_CAPSULE | Freq: Two times a day (BID) | ORAL | 3 refills | Status: DC

## 2019-09-19 NOTE — Telephone Encounter (Signed)
Patient has rescheduled appt for today due to having chills, fever and scratchy throat 2 days prior.  Patient is requesting refill of gabapentin to be sent to [facility] on [address].

## 2019-09-19 NOTE — Telephone Encounter (Signed)
Refill sent to pharmacy.   

## 2019-10-10 ENCOUNTER — Encounter: Payer: Self-pay | Admitting: Family Medicine

## 2019-10-10 ENCOUNTER — Other Ambulatory Visit: Payer: Self-pay

## 2019-10-10 ENCOUNTER — Ambulatory Visit: Payer: BC Managed Care – PPO | Admitting: Family Medicine

## 2019-10-10 VITALS — BP 116/78 | HR 84 | Temp 97.6°F | Resp 16 | Ht 63.0 in | Wt 176.0 lb

## 2019-10-10 DIAGNOSIS — N951 Menopausal and female climacteric states: Secondary | ICD-10-CM | POA: Diagnosis not present

## 2019-10-10 DIAGNOSIS — M5442 Lumbago with sciatica, left side: Secondary | ICD-10-CM

## 2019-10-10 DIAGNOSIS — L304 Erythema intertrigo: Secondary | ICD-10-CM

## 2019-10-10 DIAGNOSIS — K2100 Gastro-esophageal reflux disease with esophagitis, without bleeding: Secondary | ICD-10-CM

## 2019-10-10 DIAGNOSIS — Z853 Personal history of malignant neoplasm of breast: Secondary | ICD-10-CM

## 2019-10-10 DIAGNOSIS — G4701 Insomnia due to medical condition: Secondary | ICD-10-CM | POA: Diagnosis not present

## 2019-10-10 DIAGNOSIS — G620 Drug-induced polyneuropathy: Secondary | ICD-10-CM

## 2019-10-10 DIAGNOSIS — T451X5A Adverse effect of antineoplastic and immunosuppressive drugs, initial encounter: Secondary | ICD-10-CM

## 2019-10-10 DIAGNOSIS — M5441 Lumbago with sciatica, right side: Secondary | ICD-10-CM

## 2019-10-10 DIAGNOSIS — G8929 Other chronic pain: Secondary | ICD-10-CM

## 2019-10-10 DIAGNOSIS — Z23 Encounter for immunization: Secondary | ICD-10-CM | POA: Diagnosis not present

## 2019-10-10 DIAGNOSIS — K224 Dyskinesia of esophagus: Secondary | ICD-10-CM

## 2019-10-10 MED ORDER — NAPROXEN 500 MG PO TABS: 500.0000 mg | ORAL_TABLET | Freq: Two times a day (BID) | ORAL | 5 refills | Status: DC | PRN

## 2019-10-10 MED ORDER — TRAZODONE HCL 50 MG PO TABS: 100.0000 mg | ORAL_TABLET | Freq: Every evening | ORAL | 3 refills | Status: DC | PRN

## 2019-10-10 NOTE — Patient Instructions (Addendum)
Please return in 6 months for your annual complete physical; please come fasting.  Today you were given your flu and tdap vaccinations.   If you have any questions or concerns, please don't hesitate to send me a message via MyChart or call the office at [phone]. Thank you for visiting with [place] today! It's our pleasure caring for you.   Intertrigo Intertrigo is skin irritation or inflammation (dermatitis) that occurs when folds of skin rub together. The irritation can cause a rash and make skin raw and itchy. This condition most commonly occurs in the skin folds of these areas:  Toes.  Armpits.  Groin.  Under the belly.  Under the breasts.  Buttocks. Intertrigo is not passed from person to person (is not contagious). What are the causes? This condition is caused by heat, moisture, rubbing (friction), and not enough air circulation. The condition can be made worse by:  Sweat.  Bacteria.  A fungus, such as yeast. What increases the risk? This condition is more likely to occur if you have moisture in your skin folds. You are more likely to develop this condition if you:  Have diabetes.  Are overweight.  Are not able to move around or are not active.  Live in a warm and moist climate.  Wear splints, braces, or other medical devices.  Are not able to control your bowels or bladder (have incontinence). What are the signs or symptoms? Symptoms of this condition include:  A pink or red skin rash in the skin fold or near the skin fold.  Raw or scaly skin.  Itchiness.  A burning feeling.  Bleeding.  Leaking fluid.  A bad smell. How is this diagnosed? This condition is diagnosed with a medical history and physical exam. You may also have a skin swab to test for bacteria or a fungus. How is this treated? This condition may be treated by:  Cleaning and drying your skin.  Taking an antibiotic medicine or using an antibiotic skin cream for a bacterial  infection.  Using an antifungal cream on your skin or taking pills for an infection that was caused by a fungus, such as yeast.  Using a steroid ointment to relieve itchiness and irritation.  Separating the skin fold with a clean cotton cloth to absorb moisture and allow air to flow into the area. Follow these instructions at home:  Keep the affected area clean and dry.  Do not scratch your skin.  Stay in a cool environment as much as possible. Use an air conditioner or fan, if available.  Apply over-the-counter and prescription medicines only as told by your health care provider.  If you were prescribed an antibiotic medicine, use it as told by your health care provider. Do not stop using the antibiotic even if your condition improves.  Keep all follow-up visits as told by your health care provider. This is important. How is this prevented?   Maintain a healthy weight.  Take care of your feet, especially if you have diabetes. Foot care includes: ? Wearing shoes that fit well. ? Keeping your feet dry. ? Wearing clean, breathable socks.  Protect the skin around your groin and buttocks, especially if you have incontinence. Skin protection includes: ? Following a regular cleaning routine. ? Using skin protectant creams, powders, or ointments. ? Changing protection pads frequently.  Do not wear tight clothes. Wear clothes that are loose, absorbent, and made of cotton.  Wear a bra that gives good support, if needed.  Shower and  dry yourself well after activity or exercise. Use a hair dryer on a cool setting to dry between skin folds, especially after you bathe.  If you have diabetes, keep your blood sugar under control. Contact a health care provider if:  Your symptoms do not improve with treatment.  Your symptoms get worse or they spread.  You notice increased redness and warmth.  You have a fever. Summary  Intertrigo is skin irritation or inflammation (dermatitis)  that occurs when folds of skin rub together.  This condition is caused by heat, moisture, rubbing (friction), and not enough air circulation.  This condition may be treated by cleaning and drying your skin and with medicines.  Apply over-the-counter and prescription medicines only as told by your health care provider.  Keep all follow-up visits as told by your health care provider. This is important. This information is not intended to replace advice given to you by your health care provider. Make sure you discuss any questions you have with your health care provider. Document Revised: 06/04/2017 Document Reviewed: 06/04/2017 Elsevier Patient Education  [place].

## 2019-10-10 NOTE — Progress Notes (Signed)
Subjective  CC:  Chief Complaint  Patient presents with  . Insomnia    "it's better" around hours a night  . Peripheral Neuropathy  . Rash    under left breast  . Leg Pain    bilateral, believes to be sciatic nerve  . Gastroesophageal Reflux    wanting Prilosec dose increased    HPI: Misty Snyder is a 59 y.o. female who presents to the office today to address the problems listed above in the chief complaint.  Insomnia, multifactorial: Vasomotor symptoms and neuropathy symptoms: Doing better with trazodone nightly.  Just retired.  Had been taking naps after work.  No adverse effects.  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  Still some breakthrough symptoms but tolerable.  GERD: Reviewed recent GI notes.  She had upper endoscopy that did show gastritis.  PPI was changed.  She is experiencing breakthrough symptoms.  Also has esophageal motility with some dysphagia.  She has not had her follow-up visit.  Complains of rash on her left breast noticed 2 days ago.  Red.  No itching or pain.  Complains of 6 to 8 months of intermittent low back pain with associated radicular symptoms to the back of the thigh bilaterally.  Comes and goes.  Does not last too long.  No weakness.  Will have occasional sharp moderate to severe pains.  No known triggers but she was active at work with lifting and walking and standing all day.  No injuries.  No weakness.  Mild intermittent hip pain.  Assessment  1. Insomnia due to medical condition   2. History of right breast cancer 2016   3. Chemotherapy-induced peripheral neuropathy ([place])   4. Vasomotor symptoms due to menopause   5. Gastroesophageal reflux disease with esophagitis without hemorrhage   6. Esophageal dysmotility   7. Intertrigo   8. Need for immunization against influenza   9. Chronic bilateral low back pain with bilateral sciatica      Plan    Insomnia, multifactorial: Continued education.  Improved.  Continue trazodone and gabapentin.  Recommend against sleeping during the day since this will affect her nighttime sleep.  It should be better since she is no longer working.  She reports she feels fine during the day not sleepy when she is not working.  Peripheral neuropathy: Continue gabapentin, controlled  Menopausal vasomotor symptoms on gabapentin.  Improved  GERD and esophageal motility disorder: Recommend follow-up with GI.  Patient to schedule.  Intertrigo: Nystatin Monistat cream or powder twice daily for 1 to 2 weeks.  Reassured.  See handout.  Low back pain: Musculoskeletal possible sciatica and/or piriformis syndrome: Recommend stretching, back exercises, Naprosyn as needed but limit use due to GERD.  Ice to hips and follow-up if not proving.  Consider PT.  Flu shot and Tdap today  Follow up: 6 months for complete physical Visit date not found  Orders Placed This Encounter  Procedures  . Flu Vaccine QUAD 36+ mos IM  . Tdap vaccine greater than or equal to 7yo IM   Meds ordered this encounter  Medications  . traZODone (DESYREL) 50 MG tablet    Sig: Take 2 tablets (100 mg total) by mouth at bedtime as needed for sleep.    Dispense:  180 tablet    Refill:  3  . naproxen (NAPROSYN) 500 MG tablet    Sig: Take 1 tablet (500 mg total) by mouth 2 (two) times daily  as needed.    Dispense:  60 tablet    Refill:  5      I reviewed the patients updated PMH, FH, and SocHx.    Patient Active Problem List   Diagnosis Date Noted  . Adenomatous polyp 03/03/2019    Priority: High  . Chemotherapy-induced peripheral neuropathy ([place]) 10/21/2014    Priority: High  . History of right breast cancer 2016 05/29/2014    Priority: High  . Reflux esophagitis 03/03/2019    Priority: Medium  . Esophageal dysmotility 06/14/2016    Priority: Medium  . Constipation, chronic 04/29/2015    Priority: Medium  . Insomnia due to  medical condition 05/07/2012    Priority: Medium  . Uterine fibroid 11/19/2008    Priority: Medium  . Atrophic vaginitis 02/25/2019    Priority: Low  . Vasomotor symptoms due to menopause 02/25/2019    Priority: Low  . Genetic testing 06/25/2014   Current Meds  Medication Sig  . estradiol (ESTRACE) 0.1 MG/GM vaginal cream Place 1 Applicatorful vaginally 3 (three) times a week.  . gabapentin (NEURONTIN) 300 MG capsule Take 1 capsule (300 mg total) by mouth 2 (two) times daily.  . naproxen (NAPROSYN) 500 MG tablet Take 1 tablet (500 mg total) by mouth 2 (two) times daily as needed.  Marland Kitchen omeprazole (PRILOSEC) 40 MG capsule Take 1 capsule (40 mg total) by mouth in the morning and at bedtime.  . traZODone (DESYREL) 50 MG tablet Take 2 tablets (100 mg total) by mouth at bedtime as needed for sleep.  . [DISCONTINUED] naproxen (NAPROSYN) 500 MG tablet Take 1 tablet (500 mg total) by mouth 2 (two) times daily with a meal. (Patient taking differently: Take 500 mg by mouth as directed. )  . [DISCONTINUED] traZODone (DESYREL) 50 MG tablet Take 2 tablets (100 mg total) by mouth at bedtime as needed for sleep.    Allergies: Patient has No Known Allergies. Family History: Patient family history includes Alcohol abuse in an other family member; Prostate cancer in her maternal grandfather. Social History:  Patient  reports that she has never smoked. She has never used smokeless tobacco. She reports that she does not drink alcohol and does not use drugs.  Review of Systems: Constitutional: Negative for fever malaise or anorexia Cardiovascular: negative for chest pain Respiratory: negative for SOB or persistent cough Gastrointestinal: negative for abdominal pain  Objective  Vitals: BP 116/78   Pulse 84   Temp 97.6 F (36.4 C) (Temporal)   Resp 16   Ht 5\' 3"  (1.6 m)   Wt 176 lb (79.8 kg)   LMP 06/28/2011   SpO2 98%   BMI 31.18 kg/m  General: no acute distress , A&Ox3 HEENT: PEERL,  conjunctiva normal, neck is supple Cardiovascular:  RRR without murmur or gallop.  Respiratory:  Good breath sounds bilaterally, CTAB with normal respiratory effort Skin:  Warm, ovoid red rash beneath left breast Back: Mild tenderness paravertebral lumbar musculature without spinal tenderness no SI joint tenderness, mild sciatic notch tenderness, mild bilateral hip bursitis tenderness, negative straight leg raise, good range of motion.  Normal gait     Commons side effects, risks, benefits, and alternatives for medications and treatment plan prescribed today were discussed, and the patient expressed understanding of the given instructions. Patient is instructed to call or message via MyChart if he/she has any questions or concerns regarding our treatment plan. No barriers to understanding were identified. We discussed Red Flag symptoms and signs in detail. Patient expressed understanding regarding  what to do in case of urgent or emergency type symptoms.   Medication list was reconciled, printed and provided to the patient in AVS. Patient instructions and summary information was reviewed with the patient as documented in the AVS. This note was prepared with assistance of Dragon voice recognition software. Occasional wrong-word or sound-a-like substitutions may have occurred due to the inherent limitations of voice recognition software  This visit occurred during the SARS-CoV-2 public health emergency.  Safety protocols were in place, including screening questions prior to the visit, additional usage of staff PPE, and extensive cleaning of exam room while observing appropriate contact time as indicated for disinfecting solutions.

## 2020-02-23 ENCOUNTER — Ambulatory Visit: Payer: BC Managed Care – PPO | Admitting: Podiatry

## 2020-02-27 ENCOUNTER — Ambulatory Visit: Payer: BC Managed Care – PPO | Admitting: Podiatry

## 2020-02-27 ENCOUNTER — Encounter: Payer: Self-pay | Admitting: Podiatry

## 2020-02-27 ENCOUNTER — Ambulatory Visit (INDEPENDENT_AMBULATORY_CARE_PROVIDER_SITE_OTHER): Payer: BC Managed Care – PPO

## 2020-02-27 ENCOUNTER — Other Ambulatory Visit: Payer: Self-pay

## 2020-02-27 DIAGNOSIS — S92514A Nondisplaced fracture of proximal phalanx of right lesser toe(s), initial encounter for closed fracture: Secondary | ICD-10-CM | POA: Diagnosis not present

## 2020-02-27 DIAGNOSIS — M79672 Pain in left foot: Secondary | ICD-10-CM

## 2020-02-27 DIAGNOSIS — M79671 Pain in right foot: Secondary | ICD-10-CM

## 2020-02-27 NOTE — Progress Notes (Signed)
Dg  

## 2020-03-01 NOTE — Progress Notes (Signed)
Subjective:   Patient ID: Misty Snyder, female   DOB: 60 y.o.   MRN: [id number]   HPI Patient presents stating she hit her left fifth digit on a storage.  2 weeks ago and its been very sore and radiating into her lower leg when she wear shoes.  States it slightly better but still very intensely discomforting and patient does not smoke likes to be active   Review of Systems  All other systems reviewed and are negative.       Objective:  Physical Exam Vitals and nursing note reviewed.  Constitutional:      Appearance: She is well-developed and well-nourished.  Cardiovascular:     Pulses: Intact distal pulses.  Pulmonary:     Effort: Pulmonary effort is normal.  Musculoskeletal:        General: Normal range of motion.  Skin:    General: Skin is warm.  Neurological:     Mental Status: She is alert.     Neurovascular status intact muscle strength was found to be adequate range of motion adequate.  Patient is found to have a swollen fifth digit left that is painful when pressed with 1 area that is intensely discomforting when palpated.  Patient has good digital perfusion well oriented x3     Assessment:  Probability for fracture of the fifth digit left foot with pain     Plan:  H&P reviewed condition recommended wider shoes ice therapy and anti-inflammatories.  This should eventually get better but will probably take 12 weeks but at this point there is no other treatments that we can pursue  X-rays indicate a fracture of the base of the proximal phalanx digit 5 left not through the joint and appears to be nondisplaced

## 2020-04-13 ENCOUNTER — Other Ambulatory Visit: Payer: Self-pay | Admitting: Family Medicine

## 2020-04-13 DIAGNOSIS — Z1231 Encounter for screening mammogram for malignant neoplasm of breast: Secondary | ICD-10-CM

## 2020-04-26 ENCOUNTER — Ambulatory Visit: Payer: BC Managed Care – PPO | Admitting: Family Medicine

## 2020-04-26 ENCOUNTER — Encounter: Payer: Self-pay | Admitting: Family Medicine

## 2020-04-26 ENCOUNTER — Other Ambulatory Visit: Payer: Self-pay

## 2020-04-26 VITALS — BP 122/78 | HR 79 | Temp 97.5°F | Ht 63.0 in | Wt 171.0 lb

## 2020-04-26 DIAGNOSIS — N952 Postmenopausal atrophic vaginitis: Secondary | ICD-10-CM

## 2020-04-26 DIAGNOSIS — Z0000 Encounter for general adult medical examination without abnormal findings: Secondary | ICD-10-CM

## 2020-04-26 DIAGNOSIS — Z853 Personal history of malignant neoplasm of breast: Secondary | ICD-10-CM

## 2020-04-26 DIAGNOSIS — T451X5A Adverse effect of antineoplastic and immunosuppressive drugs, initial encounter: Secondary | ICD-10-CM

## 2020-04-26 DIAGNOSIS — Z23 Encounter for immunization: Secondary | ICD-10-CM

## 2020-04-26 DIAGNOSIS — R42 Dizziness and giddiness: Secondary | ICD-10-CM

## 2020-04-26 DIAGNOSIS — K224 Dyskinesia of esophagus: Secondary | ICD-10-CM

## 2020-04-26 DIAGNOSIS — D369 Benign neoplasm, unspecified site: Secondary | ICD-10-CM

## 2020-04-26 DIAGNOSIS — G620 Drug-induced polyneuropathy: Secondary | ICD-10-CM | POA: Diagnosis not present

## 2020-04-26 DIAGNOSIS — Z111 Encounter for screening for respiratory tuberculosis: Secondary | ICD-10-CM

## 2020-04-26 LAB — CBC WITH DIFFERENTIAL/PLATELET
Basophils Absolute: 0 10*3/uL (ref 0.0–0.1)
Basophils Relative: 0.4 % (ref 0.0–3.0)
Eosinophils Absolute: 0 10*3/uL (ref 0.0–0.7)
Eosinophils Relative: 1.2 % (ref 0.0–5.0)
HCT: 39.3 % (ref 36.0–46.0)
Hemoglobin: 12.9 g/dL (ref 12.0–15.0)
Lymphocytes Relative: 18.1 % (ref 12.0–46.0)
Lymphs Abs: 0.7 10*3/uL (ref 0.7–4.0)
MCHC: 32.8 g/dL (ref 30.0–36.0)
MCV: 84.3 fl (ref 78.0–100.0)
Monocytes Absolute: 0.3 10*3/uL (ref 0.1–1.0)
Monocytes Relative: 7.3 % (ref 3.0–12.0)
Neutro Abs: 3 10*3/uL (ref 1.4–7.7)
Neutrophils Relative %: 73 % (ref 43.0–77.0)
Platelets: 235 10*3/uL (ref 150.0–400.0)
RBC: 4.66 Mil/uL (ref 3.87–5.11)
RDW: 15.9 % — ABNORMAL HIGH (ref 11.5–15.5)
WBC: 4.1 10*3/uL (ref 4.0–10.5)

## 2020-04-26 LAB — COMPREHENSIVE METABOLIC PANEL
ALT: 13 U/L (ref 0–35)
AST: 16 U/L (ref 0–37)
Albumin: 4.3 g/dL (ref 3.5–5.2)
Alkaline Phosphatase: 54 U/L (ref 39–117)
BUN: 14 mg/dL (ref 6–23)
CO2: 27 mEq/L (ref 19–32)
Calcium: 9.6 mg/dL (ref 8.4–10.5)
Chloride: 102 mEq/L (ref 96–112)
Creatinine, Ser: 0.66 mg/dL (ref 0.40–1.20)
GFR: 96.05 mL/min (ref >60.00)
Glucose, Bld: 80 mg/dL (ref 70–99)
Potassium: 3.8 mEq/L (ref 3.5–5.1)
Sodium: 140 mEq/L (ref 135–145)
Total Bilirubin: 0.4 mg/dL (ref 0.2–1.2)
Total Protein: 7.5 g/dL (ref 6.0–8.3)

## 2020-04-26 LAB — TSH: TSH: 1.29 u[IU]/mL (ref 0.35–4.50)

## 2020-04-26 LAB — LIPID PANEL
Cholesterol: 230 mg/dL — ABNORMAL HIGH (ref 0–200)
HDL: 66.1 mg/dL (ref >39.00)
LDL Cholesterol: 147 mg/dL — ABNORMAL HIGH (ref 0–99)
NonHDL: 164.02
Total CHOL/HDL Ratio: 3
Triglycerides: 85 mg/dL (ref 0.0–149.0)
VLDL: 17 mg/dL (ref 0.0–40.0)

## 2020-04-26 MED ORDER — MECLIZINE HCL 25 MG PO TABS: 25.0000 mg | ORAL_TABLET | Freq: Three times a day (TID) | ORAL | 0 refills | Status: DC | PRN

## 2020-04-26 NOTE — Progress Notes (Signed)
Subjective  Chief Complaint  Patient presents with  . Annual Exam    Non-fasting   . Dizziness    Mostly in the morning, but also random episodes through out the day     HPI: Misty Snyder is a 60 y.o. female who presents to [facility] today for a Female Wellness Visit. She also has the concerns and/or needs as listed above in the chief complaint. These will be addressed in addition to the Health Maintenance Visit.   Wellness Visit: annual visit with health maintenance review and exam without Pap   Health maintenance: Mammogram scheduled.  Pap smear due next year.  Colorectal cancer screenings are up-to-date.  She physically feels overall very well.  Continues to be active and works full-time.  Eligible for Shingrix vaccination.  Other vaccines are up-to-date.  She is eligible for Covid booster. Chronic disease f/u and/or acute problem visit: (deemed necessary to be done in addition to the wellness visit):  Complains of dizziness and lightheadedness, ongoing for 6 months intermittently.  She has not been very worried about it however she says that sometimes in the shower she will feel off balance, also sometimes at work she will lose her balance.  She denies any falls.  She denies true ataxia or paresis.  No double vision, slurred speech or facial droop.  No significant headaches.  She is uncertain if her symptoms are related to head movement or not.  She rarely is dizzy if sitting still.  History of breast cancer status post right mastectomy.  Mammograms are up-to-date.  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  She reports this seems to be good control.  No foot lesions.  History of adenomatous polyp on every 5-year cancer surveillance.  No blood per rectum, melena or abdominal pain.  Return vaginitis treated with intermittent use of vaginal estrogens.  Esophageal dysmotility on chronic PPI.  High dose per GI.   Reports good control.  Assessment  1. Annual physical exam   2. History of right breast cancer 2016   3. Chemotherapy-induced peripheral neuropathy ([place])   4. Adenomatous polyp   5. Atrophic vaginitis   6. Vertigo   7. Esophageal dysmotility      Plan  Female Wellness Visit:  Age appropriate Health Maintenance and Prevention measures were discussed with patient. Included topics are cancer screening recommendations, ways to keep healthy (see AVS) including dietary and exercise recommendations, regular eye and dental care, use of seat belts, and avoidance of moderate alcohol use and tobacco use.   BMI: discussed patient's BMI and encouraged positive lifestyle modifications to help get to or maintain a target BMI.  HM needs and immunizations were addressed and ordered. See below for orders. See HM and immunization section for updates.  First Shingrix given today  Routine labs and screening tests ordered including cmp, cbc and lipids where appropriate.  Discussed recommendations regarding Vit D and calcium supplementation (see AVS)  Chronic disease management visit and/or acute problem visit:  History of breast cancer: We will get mammogram scheduled in May.  Continue gabapentin for peripheral neuropathy symptoms.  Atrophic vaginitis: Intermittent use of estrogen creams topically recommended.  Vertigo: Symptoms most consistent with possible peripheral positional vertigo.  We will try meclizine.  Patient to monitor symptoms closely.  If worsening, return for further evaluation.  Possible brain scan.  Continue chronic PPI.   Follow up: 12 months for complete physical Orders Placed This Encounter  Procedures  .  CBC with Differential/Platelet  . Comprehensive metabolic panel  . Lipid panel  . TSH   Meds ordered this encounter  Medications  . meclizine (ANTIVERT) 25 MG tablet    Sig: Take 1 tablet (25 mg total) by mouth 3 (three) times daily as needed for dizziness.     Dispense:  30 tablet    Refill:  0      Body mass index is 30.29 kg/m. Wt Readings from Last 3 Encounters:  04/26/20 171 lb (77.6 kg)  10/10/19 176 lb (79.8 kg)  05/12/19 176 lb 2 oz (79.9 kg)     Patient Active Problem List   Diagnosis Date Noted  . Adenomatous polyp 03/03/2019    Priority: High    Colonoscopy 02/2019   . Chemotherapy-induced peripheral neuropathy ([place]) 10/21/2014    Priority: High  . History of right breast cancer 2016 05/29/2014    Priority: High  . Reflux esophagitis 03/03/2019    Priority: Medium    egd 02/2019   . Esophageal dysmotility 06/14/2016    Priority: Medium  . Constipation, chronic 04/29/2015    Priority: Medium  . 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 Uterine fibroid 11/19/2008    Priority: Medium  . Atrophic vaginitis 02/25/2019    Priority: Low  . Vasomotor symptoms due to menopause 02/25/2019    Priority: Low  . Genetic testing 06/25/2014    Negative; No pathogenic mutations BreastNext (17 genes) @ [employer]: ATM, BARD1, BRCA1, BRCA2, BRIP1, CDH1, CHEK2, MRE11A, MUTYH, NBN, NF1, PALB2, PTEN, RAD50, RAD51C, RAD51D, and TP53. Tested 06/03/14    Health Maintenance  Topic Date Due  . COVID-19 Vaccine (1) Never done  . MAMMOGRAM  05/25/2020  . INFLUENZA VACCINE  08/16/2020  . PAP SMEAR-Modifier  06/14/2021  . COLONOSCOPY (Pts 45-71yr Insurance coverage will need to be confirmed)  03/02/2026  . TETANUS/TDAP  10/09/2029  . Hepatitis C Screening  Completed  . HPV VACCINES  Aged Out  . HIV Screening  Discontinued   Immunization History  Administered Date(s) Administered  . Influenza Whole 11/19/2008, 10/08/2009  . Influenza,inj,Quad PF,6+ Mos 10/16/2013, 12/02/2014, 10/24/2018, 10/10/2019  . Influenza-Unspecified 11/09/2015  . Pneumococcal Polysaccharide-23 02/16/2014  . Td 10/08/2009  . Tdap 10/10/2019   We updated and reviewed the patient's past history in detail and it is documented  below. Allergies: Patient has No Known Allergies. Past Medical History Patient  has a past medical history of Breast cancer ([facility] (05/15/14), Depression, Personal history of chemotherapy, Reflux esophagitis (03/03/2019), Tubular adenoma of colon (03/03/2019), and Uterine fibroid. Past Surgical History Patient  has a past surgical history that includes Neck surgery; Mastectomy w/ sentinel node biopsy (Right, 06/18/2014); Insertion central venous access device w/ subcutaneous port (06/18/2014); Mastectomy w/ sentinel node biopsy (Right, 06/17/2014); Portacath placement (Left, 06/17/2014); and Mastectomy (Right, 2016). Family History: Patient family history includes Alcohol abuse in an other family member; Prostate cancer in her maternal grandfather. Social History:  Patient  reports that she has never smoked. She has never used smokeless tobacco. She reports that she does not drink alcohol and does not use drugs.  Review of Systems: Constitutional: negative for fever or malaise Ophthalmic: negative for photophobia, double vision or loss of vision Cardiovascular: negative for chest pain, dyspnea on exertion, or new LE swelling Respiratory: negative for SOB or persistent cough Gastrointestinal: negative for abdominal pain, change in bowel habits or melena Genitourinary: negative for dysuria or gross hematuria, no abnormal uterine bleeding or disharge Musculoskeletal:  negative for new gait disturbance or muscular weakness Integumentary: negative for new or persistent rashes, no breast lumps Neurological: negative for TIA or stroke symptoms, new onset vertigo as noted above Psychiatric: negative for SI or delusions Allergic/Immunologic: negative for hives  Patient Care Team    Relationship Specialty Notifications Start End  Leamon Arnt, MD PCP - General Family Medicine  10/24/18   Nicholas Lose, MD Consulting Physician Hematology and Oncology  12/23/14   Donnie Mesa, MD Consulting Physician  General Surgery  12/23/14   Sylvan Cheese, NP Nurse Practitioner Hematology and Oncology  12/23/14    Comment: Survivorship  Ladene Artist, MD Consulting Physician Gastroenterology  10/24/18     Objective  Vitals: BP 122/78   Pulse 79   Temp (!) 97.5 F (36.4 C) (Temporal)   Ht '5\' 3"'  (1.6 m)   Wt 171 lb (77.6 kg)   LMP 06/28/2011   SpO2 97%   BMI 30.29 kg/m  Negative orthostatic hypotension on exam today. General:  Well developed, well nourished, no acute distress  Psych:  Alert and orientedx3,normal mood and affect HEENT:  Normocephalic, atraumatic, non-icteric sclera,  supple neck without adenopathy, mass or thyromegaly Cardiovascular:  Normal S1, S2, RRR without gallop, rub or murmur Respiratory:  Good breath sounds bilaterally, CTAB with normal respiratory effort Gastrointestinal: normal bowel sounds, soft, non-tender, no noted masses. No HSM MSK: no deformities, contusions. Joints are without erythema or swelling.  Skin:  Warm, no rashes or suspicious lesions noted Neurologic:    Mental status is normal. CN 2-11 are normal. Gross motor and sensory exams are normal. Normal gait. No tremor, negative Romberg, normal heel-to-toe gait, no nystagmus no vertigo with head movement. Breast Exam: Right mastectomy, no chest wall mass, no mass, skin retraction or nipple discharge is appreciated in left breast. No axillary adenopathy. Fibrocystic changes are not noted    Commons side effects, risks, benefits, and alternatives for medications and treatment plan prescribed today were discussed, and the patient expressed understanding of the given instructions. Patient is instructed to call or message via MyChart if he/she has any questions or concerns regarding our treatment plan. No barriers to understanding were identified. We discussed Red Flag symptoms and signs in detail. Patient expressed understanding regarding what to do in case of urgent or emergency type symptoms.    Medication list was reconciled, printed and provided to the patient in AVS. Patient instructions and summary information was reviewed with the patient as documented in the AVS. This note was prepared with assistance of Dragon voice recognition software. Occasional wrong-word or sound-a-like substitutions may have occurred due to the inherent limitations of voice recognition software  This visit occurred during the SARS-CoV-2 public health emergency.  Safety protocols were in place, including screening questions prior to the visit, additional usage of staff PPE, and extensive cleaning of exam room while observing appropriate contact time as indicated for disinfecting solutions.

## 2020-04-26 NOTE — Patient Instructions (Addendum)
Please return in for your annual complete physical; please come fasting. Please schedule nurse visit in 2 months for 2nd shingrix vaccination.   I will release your lab results to you on your MyChart account with further instructions. Please reply with any questions.   Today you were given your shingrix #1 of 2 vaccination.   If you have any questions or concerns, please don't hesitate to send me a message via MyChart or call the office at [phone]. Thank you for visiting with [place] today! It's our pleasure caring for you.   Preventive Care 48-71 Years Old, Female Preventive care refers to lifestyle choices and visits with your health care provider that can promote health and wellness. This includes:  A yearly physical exam. This is also called an annual wellness visit.  Regular dental and eye exams.  Immunizations.  Screening for certain conditions.  Healthy lifestyle choices, such as: ? Eating a healthy diet. ? Getting regular exercise. ? Not using drugs or products that contain nicotine and tobacco. ? Limiting alcohol use. What can I expect for my preventive care visit? Physical exam Your health care provider will check your:  Height and weight. These may be used to calculate your BMI (body mass index). BMI is a measurement that tells if you are at a healthy weight.  Heart rate and blood pressure.  Body temperature.  Skin for abnormal spots. Counseling Your health care provider may ask you questions about your:  Past medical problems.  Family's medical history.  Alcohol, tobacco, and drug use.  Emotional well-being.  Home life and relationship well-being.  Sexual activity.  Diet, exercise, and sleep habits.  Work and work Statistician.  Access to firearms.  Method of birth control.  Menstrual cycle.  Pregnancy history. What immunizations do I need? Vaccines are usually given at various ages, according to a schedule. Your health care provider will  recommend vaccines for you based on your age, medical history, and lifestyle or other factors, such as travel or where you work.   What tests do I need? Blood tests  Lipid and cholesterol levels. These may be checked every 5 years, or more often if you are over 22 years old.  Hepatitis C test.  Hepatitis B test. Screening  Lung cancer screening. You may have this screening every year starting at age 42 if you have a 30-pack-year history of smoking and currently smoke or have quit within the past 15 years.  Colorectal cancer screening. ? All adults should have this screening starting at age 16 and continuing until age 73. ? Your health care provider may recommend screening at age 46 if you are at increased risk. ? You will have tests every 1-10 years, depending on your results and the type of screening test.  Diabetes screening. ? This is done by checking your blood sugar (glucose) after you have not eaten for a while (fasting). ? You may have this done every 1-3 years.  Mammogram. ? This may be done every 1-2 years. ? Talk with your health care provider about when you should start having regular mammograms. This may depend on whether you have a family history of breast cancer.  BRCA-related cancer screening. This may be done if you have a family history of breast, ovarian, tubal, or peritoneal cancers.  Pelvic exam and Pap test. ? This may be done every 3 years starting at age 73. ? Starting at age 52, this may be done every 5 years if you have a Pap  test in combination with an HPV test. Other tests  STD (sexually transmitted disease) testing, if you are at risk.  Bone density scan. This is done to screen for osteoporosis. You may have this scan if you are at high risk for osteoporosis. Talk with your health care provider about your test results, treatment options, and if necessary, the need for more tests. Follow these instructions at home: Eating and drinking  Eat a diet  that includes fresh fruits and vegetables, whole grains, lean protein, and low-fat dairy products.  Take vitamin and mineral supplements as recommended by your health care provider.  Do not drink alcohol if: ? Your health care provider tells you not to drink. ? You are pregnant, may be pregnant, or are planning to become pregnant.  If you drink alcohol: ? Limit how much you have to 0-1 drink a day. ? Be aware of how much alcohol is in your drink. In the [place] one drink equals one 12 oz bottle of beer (355 mL), one 5 oz glass of wine (148 mL), or one 1 oz glass of hard liquor (44 mL).   Lifestyle  Take daily care of your teeth and gums. Brush your teeth every morning and night with fluoride toothpaste. Floss one time each day.  Stay active. Exercise for at least 30 minutes 5 or more days each week.  Do not use any products that contain nicotine or tobacco, such as cigarettes, e-cigarettes, and chewing tobacco. If you need help quitting, ask your health care provider.  Do not use drugs.  If you are sexually active, practice safe sex. Use a condom or other form of protection to prevent STIs (sexually transmitted infections).  If you do not wish to become pregnant, use a form of birth control. If you plan to become pregnant, see your health care provider for a prepregnancy visit.  If told by your health care provider, take low-dose aspirin daily starting at age 66.  Find healthy ways to cope with stress, such as: ? Meditation, yoga, or listening to music. ? Journaling. ? Talking to a trusted person. ? Spending time with friends and family. Safety  Always wear your seat belt while driving or riding in a vehicle.  Do not drive: ? If you have been drinking alcohol. Do not ride with someone who has been drinking. ? When you are tired or distracted. ? While texting.  Wear a helmet and other protective equipment during sports activities.  If you have firearms in your house, make  sure you follow all gun safety procedures. What's next?  Visit your health care provider once a year for an annual wellness visit.  Ask your health care provider how often you should have your eyes and teeth checked.  Stay up to date on all vaccines. This information is not intended to replace advice given to you by your health care provider. Make sure you discuss any questions you have with your health care provider. Document Revised: 10/07/2019 Document Reviewed: 09/13/2017 Elsevier Patient Education  2021 [employer].  Dizziness Dizziness is a common problem. It is a feeling of unsteadiness or light-headedness. You may feel like you are about to faint. Dizziness can lead to injury if you stumble or fall. Anyone can become dizzy, but dizziness is more common in older adults. This condition can be caused by a number of things, including medicines, dehydration, or illness. Follow these instructions at home: Eating and drinking  Drink enough fluid to keep your urine clear  or pale yellow. This helps to keep you from becoming dehydrated. Try to drink more clear fluids, such as water.  Do not drink alcohol.  Limit your caffeine intake if told to do so by your health care provider. Check ingredients and nutrition facts to see if a food or beverage contains caffeine.  Limit your salt (sodium) intake if told to do so by your health care provider. Check ingredients and nutrition facts to see if a food or beverage contains sodium. Activity  Avoid making quick movements. ? Rise slowly from chairs and steady yourself until you feel okay. ? In the morning, first sit up on the side of the bed. When you feel okay, stand slowly while you hold onto something until you know that your balance is fine.  If you need to stand in one place for a long time, move your legs often. Tighten and relax the muscles in your legs while you are standing.  Do not drive or use heavy machinery if you feel  dizzy.  Avoid bending down if you feel dizzy. Place items in your home so that they are easy for you to reach without leaning over. Lifestyle  Do not use any products that contain nicotine or tobacco, such as cigarettes and e-cigarettes. If you need help quitting, ask your health care provider.  Try to reduce your stress level by using methods such as yoga or meditation. Talk with your health care provider if you need help to manage your stress. General instructions  Watch your dizziness for any changes.  Take over-the-counter and prescription medicines only as told by your health care provider. Talk with your health care provider if you think that your dizziness is caused by a medicine that you are taking.  Tell a friend or a family member that you are feeling dizzy. If he or she notices any changes in your behavior, have this person call your health care provider.  Keep all follow-up visits as told by your health care provider. This is important. Contact a health care provider if:  Your dizziness does not go away.  Your dizziness or light-headedness gets worse.  You feel nauseous.  You have reduced hearing.  You have new symptoms.  You are unsteady on your feet or you feel like the room is spinning. Get help right away if:  You vomit or have diarrhea and are unable to eat or drink anything.  You have problems talking, walking, swallowing, or using your arms, hands, or legs.  You feel generally weak.  You are not thinking clearly or you have trouble forming sentences. It may take a friend or family member to notice this.  You have chest pain, abdominal pain, shortness of breath, or sweating.  Your vision changes.  You have any bleeding.  You have a severe headache.  You have neck pain or a stiff neck.  You have a fever. These symptoms may represent a serious problem that is an emergency. Do not wait to see if the symptoms will go away. Get medical help right away.  Call your local emergency services (911 in the [place] Do not drive yourself to the hospital. Summary  Dizziness is a feeling of unsteadiness or light-headedness. This condition can be caused by a number of things, including medicines, dehydration, or illness.  Anyone can become dizzy, but dizziness is more common in older adults.  Drink enough fluid to keep your urine clear or pale yellow. Do not drink alcohol.  Avoid making quick  movements if you feel dizzy. Monitor your dizziness for any changes. This information is not intended to replace advice given to you by your health care provider. Make sure you discuss any questions you have with your health care provider. Document Revised: 01/05/2017 Document Reviewed: 02/05/2016 Elsevier Patient Education  2021 [employer].

## 2020-04-26 NOTE — Addendum Note (Signed)
Addended by: Doran Clay A on: 04/26/2020 11:16 AM   Modules accepted: Orders

## 2020-04-28 LAB — QUANTIFERON-TB GOLD PLUS
Mitogen-NIL: 7.74 IU/mL
NIL: 7.54 IU/mL
QuantiFERON-TB Gold Plus: NEGATIVE
TB1-NIL: <0 IU/mL
TB2-NIL: <0 IU/mL

## 2020-06-04 ENCOUNTER — Other Ambulatory Visit: Payer: Self-pay

## 2020-06-04 ENCOUNTER — Ambulatory Visit: Payer: BC Managed Care – PPO

## 2020-06-04 ENCOUNTER — Ambulatory Visit
Admission: RE | Admit: 2020-06-04 | Discharge: 2020-06-04 | Disposition: A | Discharge status: Home / Self Care | Payer: BC Managed Care – PPO | Source: Ambulatory Visit | Attending: Family Medicine | Admitting: Family Medicine

## 2020-06-04 DIAGNOSIS — Z1231 Encounter for screening mammogram for malignant neoplasm of breast: Secondary | ICD-10-CM

## 2020-06-08 ENCOUNTER — Telehealth: Payer: Self-pay

## 2020-06-08 NOTE — Telephone Encounter (Signed)
Please advise 

## 2020-06-08 NOTE — Telephone Encounter (Signed)
Patient called regarding Mammo results just waiting in sign off from [email or web address]

## 2020-06-09 ENCOUNTER — Other Ambulatory Visit: Payer: Self-pay | Admitting: Family Medicine

## 2020-06-09 DIAGNOSIS — R928 Other abnormal and inconclusive findings on diagnostic imaging of breast: Secondary | ICD-10-CM

## 2020-06-09 NOTE — Telephone Encounter (Signed)
I signed orders. Not sure what she is needing?

## 2020-06-09 NOTE — Telephone Encounter (Signed)
Spoke with patient, she stated how she was unhappy with [facility]. Stated she did not receive a detailed report on mammogram, has to have it repeated tech who did mammo made her very uncomfortable and felt the did not do the imaging correctly. Patient thought we may have had a more detailed reading.

## 2020-06-10 ENCOUNTER — Ambulatory Visit
Admission: RE | Admit: 2020-06-10 | Discharge: 2020-06-10 | Disposition: A | Discharge status: Home / Self Care | Payer: BC Managed Care – PPO | Source: Ambulatory Visit | Attending: Family Medicine | Admitting: Family Medicine

## 2020-06-10 ENCOUNTER — Ambulatory Visit: Payer: BC Managed Care – PPO

## 2020-06-10 ENCOUNTER — Other Ambulatory Visit: Payer: Self-pay

## 2020-06-10 DIAGNOSIS — R928 Other abnormal and inconclusive findings on diagnostic imaging of breast: Secondary | ICD-10-CM

## 2020-06-11 ENCOUNTER — Other Ambulatory Visit: Payer: Self-pay | Admitting: Gastroenterology

## 2020-06-16 ENCOUNTER — Other Ambulatory Visit: Payer: Self-pay | Admitting: Gastroenterology

## 2020-06-30 ENCOUNTER — Other Ambulatory Visit: Payer: Self-pay

## 2020-06-30 ENCOUNTER — Telehealth: Payer: Self-pay

## 2020-06-30 MED ORDER — OMEPRAZOLE 40 MG PO CPDR: 40.0000 mg | DELAYED_RELEASE_CAPSULE | Freq: Two times a day (BID) | ORAL | 11 refills | Status: DC

## 2020-06-30 NOTE — Telephone Encounter (Signed)
.   LAST APPOINTMENT DATE: 06/08/2020   NEXT APPOINTMENT DATE:@6 /16/2022  MEDICATION:omeprazole (PRILOSEC) 40 MG capsule  PHARMACY:[id number] - [place], [place] [address]

## 2020-07-01 ENCOUNTER — Encounter: Payer: Self-pay | Admitting: *Deleted

## 2020-07-01 ENCOUNTER — Ambulatory Visit (INDEPENDENT_AMBULATORY_CARE_PROVIDER_SITE_OTHER): Payer: BC Managed Care – PPO | Admitting: *Deleted

## 2020-07-01 ENCOUNTER — Other Ambulatory Visit: Payer: Self-pay

## 2020-07-01 DIAGNOSIS — Z23 Encounter for immunization: Secondary | ICD-10-CM | POA: Diagnosis not present

## 2020-07-01 NOTE — Progress Notes (Signed)
Per orders of Dr. Jonni Sanger , injection of Shingrix 0.5 ml given IM by Anselmo Pickler, LPN in left deltoid. Patient tolerated injection well, has completed the series.

## 2020-10-25 ENCOUNTER — Other Ambulatory Visit: Payer: Self-pay | Admitting: Family Medicine

## 2020-10-25 ENCOUNTER — Ambulatory Visit (INDEPENDENT_AMBULATORY_CARE_PROVIDER_SITE_OTHER): Payer: BC Managed Care – PPO

## 2020-10-25 ENCOUNTER — Other Ambulatory Visit: Payer: Self-pay

## 2020-10-25 DIAGNOSIS — Z23 Encounter for immunization: Secondary | ICD-10-CM

## 2021-03-14 ENCOUNTER — Other Ambulatory Visit: Payer: Self-pay | Admitting: Family Medicine

## 2021-03-14 DIAGNOSIS — Z1231 Encounter for screening mammogram for malignant neoplasm of breast: Secondary | ICD-10-CM

## 2021-04-28 ENCOUNTER — Encounter: Payer: BC Managed Care – PPO | Admitting: Family Medicine

## 2021-05-11 ENCOUNTER — Other Ambulatory Visit (HOSPITAL_COMMUNITY)
Admission: RE | Admit: 2021-05-11 | Discharge: 2021-05-11 | Disposition: A | Discharge status: Home / Self Care | Payer: BC Managed Care – PPO | Source: Ambulatory Visit | Attending: Family Medicine | Admitting: Family Medicine

## 2021-05-11 ENCOUNTER — Encounter: Payer: Self-pay | Admitting: Family Medicine

## 2021-05-11 ENCOUNTER — Ambulatory Visit (INDEPENDENT_AMBULATORY_CARE_PROVIDER_SITE_OTHER): Payer: BC Managed Care – PPO | Admitting: Family Medicine

## 2021-05-11 VITALS — BP 120/60 | HR 96 | Temp 98.2°F | Ht 63.0 in | Wt 172.0 lb

## 2021-05-11 DIAGNOSIS — Z0000 Encounter for general adult medical examination without abnormal findings: Secondary | ICD-10-CM | POA: Diagnosis present

## 2021-05-11 DIAGNOSIS — G620 Drug-induced polyneuropathy: Secondary | ICD-10-CM

## 2021-05-11 DIAGNOSIS — Z124 Encounter for screening for malignant neoplasm of cervix: Secondary | ICD-10-CM

## 2021-05-11 DIAGNOSIS — Z853 Personal history of malignant neoplasm of breast: Secondary | ICD-10-CM | POA: Diagnosis not present

## 2021-05-11 DIAGNOSIS — K2100 Gastro-esophageal reflux disease with esophagitis, without bleeding: Secondary | ICD-10-CM

## 2021-05-11 DIAGNOSIS — G4701 Insomnia due to medical condition: Secondary | ICD-10-CM | POA: Diagnosis not present

## 2021-05-11 DIAGNOSIS — B354 Tinea corporis: Secondary | ICD-10-CM

## 2021-05-11 DIAGNOSIS — T451X5A Adverse effect of antineoplastic and immunosuppressive drugs, initial encounter: Secondary | ICD-10-CM

## 2021-05-11 DIAGNOSIS — F339 Major depressive disorder, recurrent, unspecified: Secondary | ICD-10-CM | POA: Insufficient documentation

## 2021-05-11 MED ORDER — KETOCONAZOLE 2 % EX CREA: 1.0000 "application " | TOPICAL_CREAM | Freq: Two times a day (BID) | CUTANEOUS | 0 refills | Status: AC

## 2021-05-11 MED ORDER — ESCITALOPRAM OXALATE 10 MG PO TABS: 10.0000 mg | ORAL_TABLET | Freq: Every day | ORAL | 2 refills | Status: DC

## 2021-05-11 MED ORDER — GABAPENTIN 300 MG PO CAPS: 300.0000 mg | ORAL_CAPSULE | Freq: Two times a day (BID) | ORAL | 3 refills | Status: DC

## 2021-05-11 NOTE — Progress Notes (Signed)
?Subjective  ?Chief Complaint  ?Patient presents with  ? Annual Exam  ?  Pt here for Annual exam and she is not currently fasting. Pt stated that she has very dry scalp and breaks out with a rash behind her ears and under bra line  ? ? ?HPI: Misty Snyder is a 61 y.o. female who presents to [facility] [place] today for a Female Wellness Visit. She also has the concerns and/or needs as listed above in the chief complaint. These will be addressed in addition to the Health Maintenance Visit.  ? ?Wellness Visit: annual visit with health maintenance review and exam with Pap ? ?Health maintenance: Due for cervical cancer screening.  Mammogram scheduled for next month.  Colorectal cancer screening current.  All immunizations are current. ?Chronic disease f/u and/or acute problem visit: (deemed necessary to be done in addition to the wellness visit): ?Mood: Feeling down.  Staying at home most of the time by herself.  Stressed about finances.  Anhedonia.  Some worry.  No panic symptoms.  Has history of mild depression.  Has never been on chronic antidepressant.  Is considering seeing a counselor.  No suicidal ideation.  Sleep is good ?Insomnia: Trazodone is helpful. ?Neuropathy: Continues on gabapentin which controls her symptoms fairly well.  Still interfering with sleep. ?GERD history: No longer on PPI.  Reports doing better. ?History of breast cancer: Reports red itchy circular rash around scar on right chest wall.  Has been there for last 3 to 4 weeks.  No masses.  Mammograms are up-to-date. ? ?Assessment  ?1. Annual physical exam   ?2. Cervical cancer screening   ?3. Chemotherapy-induced peripheral neuropathy ([place])   ?4. History of right breast cancer 2016   ?5. Insomnia due to medical condition   ?6. Gastroesophageal reflux disease with esophagitis without hemorrhage   ?7. Recurrent depression ([place])   ?8. Tinea corporis   ? ?  ?Plan  ?Female Wellness Visit: ?Age appropriate Health Maintenance  and Prevention measures were discussed with patient. Included topics are cancer screening recommendations, ways to keep healthy (see AVS) including dietary and exercise recommendations, regular eye and dental care, use of seat belts, and avoidance of moderate alcohol use and tobacco use.  Mammo scheduled.  Pap smear with high-risk HPV testing done today.  Difficult exam due to atrophic vaginitis. ?BMI: discussed patient's BMI and encouraged positive lifestyle modifications to help get to or maintain a target BMI. ?HM needs and immunizations were addressed and ordered. See below for orders. See HM and immunization section for updates. ?Routine labs and screening tests ordered including cmp, cbc and lipids where appropriate. ?Discussed recommendations regarding Vit D and calcium supplementation (see AVS) ? ?Chronic disease management visit and/or acute problem visit: ?Neuropathy: Continue gabapentin 300 mg twice daily refilled today. ?Tinea corporis: Ketoconazole twice daily for 2 weeks. ?Insomnia well-controlled on trazodone 100 mg nightly. ?Depression: Recurrent.  Counseling done.  Recommend psychotherapy.  Start Lexapro 10 mg daily.  Follow-up in 6 weeks.  See after visit summary. ? ?Follow up: 6 weeks to recheck mood ?Orders Placed This Encounter  ?Procedures  ? CBC with Differential/Platelet  ? Comprehensive metabolic panel  ? Lipid panel  ? TSH  ? ?Meds ordered this encounter  ?Medications  ? ketoconazole (NIZORAL) 2 % cream  ?  Sig: Apply 1 application. topically 2 (two) times daily for 7 days. Then as needed  ?  Dispense:  30 g  ?  Refill:  0  ?  escitalopram (LEXAPRO) 10 MG tablet  ?  Sig: Take 1 tablet (10 mg total) by mouth daily.  ?  Dispense:  30 tablet  ?  Refill:  2  ? gabapentin (NEURONTIN) 300 MG capsule  ?  Sig: Take 1 capsule (300 mg total) by mouth 2 (two) times daily.  ?  Dispense:  180 capsule  ?  Refill:  3  ? ?  ? ?Body mass index is 30.47 kg/m?. ?Wt Readings from Last 3 Encounters:  ?05/11/21  172 lb (78 kg)  ?04/26/20 171 lb (77.6 kg)  ?10/10/19 176 lb (79.8 kg)  ? ? ? ?Patient Active Problem List  ? Diagnosis Date Noted  ? Adenomatous polyp 03/03/2019  ?  Priority: High  ?  Colonoscopy 02/2019 ? ?  ? Chemotherapy-induced peripheral neuropathy ([place]) 10/21/2014  ?  Priority: High  ? History of right breast cancer 2016 05/29/2014  ?  Priority: High  ? Reflux esophagitis 03/03/2019  ?  Priority: Medium   ?  egd 02/2019 ? ?  ? Esophageal dysmotility 06/14/2016  ?  Priority: Medium   ? Constipation, chronic 04/29/2015  ?  Priority: Medium   ? Insomnia due to medical condition 05/07/2012  ?  Priority: Medium   ?  Menopausal and neuropathy. Started trazadone in 2021 ? ?  ? Uterine fibroid 11/19/2008  ?  Priority: Medium   ? Atrophic vaginitis 02/25/2019  ?  Priority: Low  ? Vasomotor symptoms due to menopause 02/25/2019  ?  Priority: Low  ? Recurrent depression ([place]) 05/11/2021  ? Genetic testing 06/25/2014  ?  Negative; No pathogenic mutations ?BreastNext (17 genes) @ [employer]: ATM, BARD1, BRCA1, BRCA2, BRIP1, CDH1, CHEK2, MRE11A, MUTYH, NBN, NF1, PALB2, PTEN, RAD50, RAD51C, RAD51D, and TP53. ?Tested 06/03/14 ? ?  ? ?Health Maintenance  ?Topic Date Due  ? COVID-19 Vaccine (1) Never done  ? PAP SMEAR-Modifier  06/14/2021  ? MAMMOGRAM  06/04/2021  ? INFLUENZA VACCINE  08/16/2021  ? COLONOSCOPY (Pts 45-51yrs Insurance coverage will need to be confirmed)  03/02/2026  ? TETANUS/TDAP  10/09/2029  ? Hepatitis C Screening  Completed  ? Zoster Vaccines- Shingrix  Completed  ? HPV VACCINES  Aged Out  ? HIV Screening  Discontinued  ? ?Immunization History  ?Administered Date(s) Administered  ? Influenza Whole 11/19/2008, 10/08/2009  ? Influenza,inj,Quad PF,6+ Mos 10/16/2013, 12/02/2014, 10/24/2018, 10/10/2019, 10/25/2020  ? Influenza-Unspecified 11/09/2015  ? Pneumococcal Polysaccharide-23 02/16/2014  ? Td 10/08/2009  ? Tdap 10/10/2019  ? Zoster Recombinat (Shingrix) 04/26/2020, 07/01/2020  ? ?We updated and reviewed the  patient's past history in detail and it is documented below. ?Allergies: ?Patient has No Known Allergies. ?Past Medical History ?Patient  has a past medical history of Breast cancer ([place]) (05/15/14), Depression, Personal history of chemotherapy, Reflux esophagitis (03/03/2019), Tubular adenoma of colon (03/03/2019), and Uterine fibroid. ?Past Surgical History ?Patient  has a past surgical history that includes Neck surgery; Mastectomy w/ sentinel node biopsy (Right, 06/18/2014); Insertion central venous access device w/ subcutaneous port (06/18/2014); Mastectomy w/ sentinel node biopsy (Right, 06/17/2014); Portacath placement (Left, 06/17/2014); and Mastectomy (Right, 2016). ?Family History: ?Patient family history includes Alcohol abuse in an other family member; Prostate cancer in her maternal grandfather. ?Social History:  ?Patient  reports that she has never smoked. She has never used smokeless tobacco. She reports that she does not drink alcohol and does not use drugs. ? ?Review of Systems: ?Constitutional: negative for fever or malaise ?Ophthalmic: negative for photophobia, double vision or loss of vision ?Cardiovascular:  negative for chest pain, dyspnea on exertion, or new LE swelling ?Respiratory: negative for SOB or persistent cough ?Gastrointestinal: negative for abdominal pain, change in bowel habits or melena ?Genitourinary: negative for dysuria or gross hematuria, no abnormal uterine bleeding or disharge ?Musculoskeletal: negative for new gait disturbance or muscular weakness ?Integumentary: negative for new or persistent rashes, no breast lumps ?Neurological: negative for TIA or stroke symptoms ?Psychiatric: negative for SI or delusions ?Allergic/Immunologic: negative for hives ? ?Patient Care Team  ?  Relationship Specialty Notifications Start End  ?Leamon Arnt, MD PCP - General Family Medicine  10/24/18   ?Nicholas Lose, MD Consulting Physician Hematology and Oncology  12/23/14   ?Donnie Mesa, MD  Consulting Physician General Surgery  12/23/14   ?Sylvan Cheese, NP Nurse Practitioner Hematology and Oncology  12/23/14   ? Comment: Survivorship  ?Ladene Artist, MD Consulting Physician Delanna Ahmadi

## 2021-05-11 NOTE — Patient Instructions (Addendum)
Please return in 6 weeks to recheck mood.  ? ?I will release your lab results to you on your MyChart account with further instructions. You may see the results before I do, but when I review them I will send you a message with my report or have my assistant call you if things need to be discussed. Please reply to my message with any questions. Thank you!  ? ?Find a therapist. It will help.  ? ?If you have any questions or concerns, please don't hesitate to send me a message via MyChart or call the office at [phone]. Thank you for visiting with [place] today! It's our pleasure caring for you.  ? ?Depression Medications: ? ?Taking the medicine as directed and not missing any doses is one of the best things you can do to treat your depression.  Here are some things to keep in mind: ? ?Side effects (stomach upset, some increased anxiety) may happen before you notice a benefit.  These side effects typically go away over time. ?Changes to your dose of medicine or a change in medication all together is sometimes necessary ?Most people need to be on medication at least 6-12 months ?Many people will notice an improvement within two weeks but the full effect of the medication can take up to 4-6 weeks ?Stopping the medication when you start feeling better often results in a return of symptoms ?If you start having thoughts of hurting yourself or others after starting this medicine, please call the office immediately at [phone].   ? ?Please do these things to maintain good health! ? ?Exercise at least 30-45 minutes a day,  4-5 days a week.  ?Eat a low-fat diet with lots of fruits and vegetables, up to 7-9 servings per day. ?Drink plenty of water daily. Try to drink 8 8oz glasses per day. ?Seatbelts can save your life. Always wear your seatbelt. ?Place Smoke Detectors on every level of your home and check batteries every year. ?Schedule an appointment with an eye doctor for an eye exam every 1-2 years ?Safe sex - use condoms  to protect yourself from STDs if you could be exposed to these types of infections. Use birth control if you do not want to become pregnant and are sexually active. ?Avoid heavy alcohol use. If you drink, keep it to less than 2 drinks/day and not every day. ?[place].  Choose someone you trust that could speak for you if you became unable to speak for yourself. ?Depression is common in our stressful world.If you're feeling down or losing interest in things you normally enjoy, please come in for a visit. ?If anyone is threatening or hurting you, please get help. Physical or Emotional Violence is never OK.   ?

## 2021-05-11 NOTE — Addendum Note (Signed)
Addended by: Loura Back on: 05/11/2021 05:12 PM ? ? Modules accepted: Orders ? ?

## 2021-05-12 ENCOUNTER — Other Ambulatory Visit (INDEPENDENT_AMBULATORY_CARE_PROVIDER_SITE_OTHER): Payer: BC Managed Care – PPO

## 2021-05-12 DIAGNOSIS — F339 Major depressive disorder, recurrent, unspecified: Secondary | ICD-10-CM | POA: Diagnosis not present

## 2021-05-12 DIAGNOSIS — K2100 Gastro-esophageal reflux disease with esophagitis, without bleeding: Secondary | ICD-10-CM | POA: Diagnosis not present

## 2021-05-12 DIAGNOSIS — Z0000 Encounter for general adult medical examination without abnormal findings: Secondary | ICD-10-CM | POA: Diagnosis not present

## 2021-05-12 LAB — LIPID PANEL
Cholesterol: 230 mg/dL — ABNORMAL HIGH (ref 0–200)
HDL: 61.5 mg/dL (ref >39.00)
LDL Cholesterol: 148 mg/dL — ABNORMAL HIGH (ref 0–99)
NonHDL: 168.03
Total CHOL/HDL Ratio: 4
Triglycerides: 101 mg/dL (ref 0.0–149.0)
VLDL: 20.2 mg/dL (ref 0.0–40.0)

## 2021-05-12 LAB — COMPREHENSIVE METABOLIC PANEL
ALT: 15 U/L (ref 0–35)
AST: 20 U/L (ref 0–37)
Albumin: 4.5 g/dL (ref 3.5–5.2)
Alkaline Phosphatase: 61 U/L (ref 39–117)
BUN: 13 mg/dL (ref 6–23)
CO2: 28 mEq/L (ref 19–32)
Calcium: 9.6 mg/dL (ref 8.4–10.5)
Chloride: 101 mEq/L (ref 96–112)
Creatinine, Ser: 0.72 mg/dL (ref 0.40–1.20)
GFR: 90.88 mL/min (ref >60.00)
Glucose, Bld: 102 mg/dL — ABNORMAL HIGH (ref 70–99)
Potassium: 3.7 mEq/L (ref 3.5–5.1)
Sodium: 137 mEq/L (ref 135–145)
Total Bilirubin: 0.3 mg/dL (ref 0.2–1.2)
Total Protein: 8.1 g/dL (ref 6.0–8.3)

## 2021-05-12 LAB — CBC WITH DIFFERENTIAL/PLATELET
Basophils Absolute: 0 10*3/uL (ref 0.0–0.1)
Basophils Relative: 0.6 % (ref 0.0–3.0)
Eosinophils Absolute: 0 10*3/uL (ref 0.0–0.7)
Eosinophils Relative: 1 % (ref 0.0–5.0)
HCT: 42.2 % (ref 36.0–46.0)
Hemoglobin: 13.6 g/dL (ref 12.0–15.0)
Lymphocytes Relative: 22.7 % (ref 12.0–46.0)
Lymphs Abs: 1 10*3/uL (ref 0.7–4.0)
MCHC: 32.2 g/dL (ref 30.0–36.0)
MCV: 85.4 fl (ref 78.0–100.0)
Monocytes Absolute: 0.4 10*3/uL (ref 0.1–1.0)
Monocytes Relative: 10.3 % (ref 3.0–12.0)
Neutro Abs: 2.8 10*3/uL (ref 1.4–7.7)
Neutrophils Relative %: 65.4 % (ref 43.0–77.0)
Platelets: 252 10*3/uL (ref 150.0–400.0)
RBC: 4.94 Mil/uL (ref 3.87–5.11)
RDW: 15.8 % — ABNORMAL HIGH (ref 11.5–15.5)
WBC: 4.2 10*3/uL (ref 4.0–10.5)

## 2021-05-12 LAB — TSH: TSH: 1.48 u[IU]/mL (ref 0.35–5.50)

## 2021-05-16 LAB — CYTOLOGY - PAP
Adequacy: ABSENT
Comment: NEGATIVE
Diagnosis: NEGATIVE
High risk HPV: NEGATIVE

## 2021-05-17 ENCOUNTER — Other Ambulatory Visit: Payer: BC Managed Care – PPO

## 2021-06-14 ENCOUNTER — Other Ambulatory Visit: Payer: Self-pay | Admitting: Family Medicine

## 2021-06-14 ENCOUNTER — Ambulatory Visit
Admission: RE | Admit: 2021-06-14 | Discharge: 2021-06-14 | Disposition: A | Discharge status: Home / Self Care | Payer: BC Managed Care – PPO | Source: Ambulatory Visit | Attending: Family Medicine | Admitting: Family Medicine

## 2021-06-14 DIAGNOSIS — Z1231 Encounter for screening mammogram for malignant neoplasm of breast: Secondary | ICD-10-CM

## 2021-10-03 ENCOUNTER — Encounter: Payer: Self-pay | Admitting: Family Medicine

## 2021-10-04 ENCOUNTER — Encounter: Payer: Self-pay | Admitting: Family

## 2021-10-04 ENCOUNTER — Telehealth (INDEPENDENT_AMBULATORY_CARE_PROVIDER_SITE_OTHER): Payer: BC Managed Care – PPO | Admitting: Family

## 2021-10-04 VITALS — Temp 100.0°F | Ht 63.0 in | Wt 178.0 lb

## 2021-10-04 DIAGNOSIS — U071 COVID-19: Secondary | ICD-10-CM | POA: Diagnosis not present

## 2021-10-04 DIAGNOSIS — T451X5A Adverse effect of antineoplastic and immunosuppressive drugs, initial encounter: Secondary | ICD-10-CM | POA: Diagnosis not present

## 2021-10-04 DIAGNOSIS — G620 Drug-induced polyneuropathy: Secondary | ICD-10-CM

## 2021-10-04 MED ORDER — GABAPENTIN 300 MG PO CAPS: 300.0000 mg | ORAL_CAPSULE | Freq: Two times a day (BID) | ORAL | 3 refills | Status: DC

## 2021-10-04 NOTE — Progress Notes (Signed)
MyChart Video Visit    Virtual Visit via Video Note   This format is felt to be most appropriate for this patient at this time. Physical exam was limited by quality of the video and audio technology used for the visit. CMA was able to get the patient set up on a video visit.  Patient location: Home. Patient and provider in visit Provider location: Office  I discussed the limitations of evaluation and management by telemedicine and the availability of in person appointments. The patient expressed understanding and agreed to proceed.  Visit Date: 10/04/2021  Today's healthcare provider: Jeanie Sewer, NP     Subjective:   Patient ID: Misty Snyder, female    DOB: 1960-11-26, 62 y.o.   MRN: [id number]  Chief Complaint  Patient presents with   Covid Positive    Covid positive on 9/16, Symptoms started on 9/16 that afternoon. Symptoms include headaches , body aches,chills, SOB, Fever of 100.0, Fatigue and nasal congestion. Has tried Dayquil and robitussin which did not help.     HPI Upper Respiratory Infection: Symptoms include achiness, congestion, low grade fever, nasal congestion, and non productive cough, chills, headache and SOB.  Onset of symptoms was 3 days ago, unchanged since that time. She is drinking moderate amounts of fluids. Evaluation to date: none.  Treatment to date:  Tylenol, Dayquil, Zyrtec .  Reports feeling better, but can't sleep at night due to congestion. Also taking Robitussin.   Assessment & Plan:   Problem List Items Addressed This Visit       Nervous and Auditory   Chemotherapy-induced peripheral neuropathy (HCC)   Relevant Medications   gabapentin (NEURONTIN) 300 MG capsule   Other Visit Diagnoses     COVID-19    -  Primary Discussed antiviral medications, SE, pt choosing to not take, prefers to treat with OTC medications. Advised ok to try Dayquil for night time, continue Dayquil during day, Naproxen bid for aches, fever, sinus  pressure. Also can try Afrin nasal spray at bedtime, but only for 3 nights to help her breathe. Advised of CDC guidelines for masking if out in public. OK to continue taking OTC sinus or pain meds. Encouraged to monitor & notify office of any worsening symptoms: increased shortness of breath, weakness, and signs of dehydration. Instructed to rest and hydrate well.        Past Medical History:  Diagnosis Date   Breast cancer ([place]) 05/15/14   right breast invasive ductal carcinoma   Depression    Personal history of chemotherapy    Reflux esophagitis 03/03/2019   egd 02/2019   Tubular adenoma of colon 03/03/2019   Colonoscopy 02/2019   Uterine fibroid     Past Surgical History:  Procedure Laterality Date   INSERTION CENTRAL VENOUS ACCESS DEVICE W/ SUBCUTANEOUS PORT  06/18/2014   MASTECTOMY Right 2016   MASTECTOMY W/ SENTINEL NODE BIOPSY Right 06/18/2014   WITH PORT A CATH PLACEMENT   MASTECTOMY W/ SENTINEL NODE BIOPSY Right 06/17/2014   Procedure: RIGHT MASTECTOMY WITH SENTINEL LYMPH NODE BIOPSY WITH BLUE DYE MAPPING AND PORT PLACEMENT;  Surgeon: Donnie Mesa, MD;  Location: [place];  Service: General;  Laterality: Right;   NECK SURGERY     PORTACATH PLACEMENT Left 06/17/2014   Procedure: INSERTION PORT-A-CATH;  Surgeon: Donnie Mesa, MD;  Location: [place];  Service: General;  Laterality: Left;    Outpatient Medications Prior to Visit  Medication Sig Dispense Refill   escitalopram (LEXAPRO) 10 MG tablet Take  1 tablet (10 mg total) by mouth daily. 30 tablet 2   naproxen (NAPROSYN) 500 MG tablet Take 1 tablet (500 mg total) by mouth 2 (two) times daily as needed. 60 tablet 5   traZODone (DESYREL) 50 MG tablet TAKE 2 TABLETS (100 MG TOTAL) BY MOUTH AT BEDTIME AS NEEDED FOR SLEEP. 180 tablet 3   gabapentin (NEURONTIN) 300 MG capsule Take 1 capsule (300 mg total) by mouth 2 (two) times daily. 180 capsule 3   No facility-administered medications prior to visit.    No Known Allergies      Objective:   Physical Exam Vitals and nursing note reviewed.  Constitutional:      General: She is not in acute distress.    Appearance: Ill appearing.  HENT:     Head: Normocephalic.  Pulmonary:     Effort: No respiratory distress.  Musculoskeletal:     Cervical back: Normal range of motion.  Skin:    General: Skin is dry.     Coloration: Skin is not pale.  Neurological:     Mental Status: She is alert and oriented to person, place, and time.  Psychiatric:        Mood and Affect: Mood normal.   Temp 100 F (37.8 C) (Temporal)   Ht '5\' 3"'$  (1.6 m)   Wt 178 lb (80.7 kg)   LMP 03/17/2011   BMI 31.53 kg/m   Wt Readings from Last 3 Encounters:  10/04/21 178 lb (80.7 kg)  05/11/21 172 lb (78 kg)  04/26/20 171 lb (77.6 kg)        I discussed the assessment and treatment plan with the patient. The patient was provided an opportunity to ask questions and all were answered. The patient agreed with the plan and demonstrated an understanding of the instructions.   The patient was advised to call back or seek an in-person evaluation if the symptoms worsen or if the condition fails to improve as anticipated.  Jeanie Sewer, NP [place] [phone] (phone) [phone] (fax)  [place]

## 2021-10-10 ENCOUNTER — Encounter: Payer: Self-pay | Admitting: *Deleted

## 2021-12-10 ENCOUNTER — Other Ambulatory Visit: Payer: Self-pay | Admitting: Family Medicine

## 2022-01-27 ENCOUNTER — Other Ambulatory Visit: Payer: Self-pay | Admitting: Family Medicine

## 2022-05-02 ENCOUNTER — Other Ambulatory Visit: Payer: Self-pay | Admitting: Family Medicine

## 2022-05-02 DIAGNOSIS — Z0000 Encounter for general adult medical examination without abnormal findings: Secondary | ICD-10-CM

## 2022-05-05 ENCOUNTER — Telehealth: Payer: Self-pay | Admitting: Family Medicine

## 2022-05-05 NOTE — Telephone Encounter (Signed)
Prescription Request  05/05/2022  LOV: 05/11/2021  Pt wants to know if this could be filled at least enough to hold her over until 05/29/22.  What is the name of the medication or equipment? naproxen (NAPROSYN) 500 MG tablet   Have you contacted your pharmacy to request a refill? No   Which pharmacy would you like this sent to?  [id number] [place], [place] - [address] [place] [address] Phone: [phone] Fax: [phone]    Patient notified that their request is being sent to the clinical staff for review and that they should receive a response within 2 business days.   Please advise at Mobile [phone] (mobile)

## 2022-05-08 ENCOUNTER — Other Ambulatory Visit: Payer: Self-pay

## 2022-05-08 DIAGNOSIS — G620 Drug-induced polyneuropathy: Secondary | ICD-10-CM

## 2022-05-08 MED ORDER — NAPROXEN 500 MG PO TABS: 500.0000 mg | ORAL_TABLET | Freq: Two times a day (BID) | ORAL | 0 refills | Status: DC | PRN

## 2022-05-08 NOTE — Telephone Encounter (Signed)
Rx sent 

## 2022-05-29 ENCOUNTER — Encounter: Payer: BC Managed Care – PPO | Admitting: Family Medicine

## 2022-06-19 ENCOUNTER — Ambulatory Visit
Admission: RE | Admit: 2022-06-19 | Discharge: 2022-06-19 | Disposition: A | Discharge status: Home / Self Care | Payer: BC Managed Care – PPO | Source: Ambulatory Visit | Attending: Family Medicine | Admitting: Family Medicine

## 2022-06-19 DIAGNOSIS — Z0000 Encounter for general adult medical examination without abnormal findings: Secondary | ICD-10-CM

## 2022-06-21 ENCOUNTER — Ambulatory Visit (INDEPENDENT_AMBULATORY_CARE_PROVIDER_SITE_OTHER): Payer: BC Managed Care – PPO | Admitting: Family Medicine

## 2022-06-21 ENCOUNTER — Encounter: Payer: Self-pay | Admitting: Family Medicine

## 2022-06-21 VITALS — BP 138/88 | HR 88 | Temp 97.9°F | Ht 63.0 in | Wt 179.4 lb

## 2022-06-21 DIAGNOSIS — Z0000 Encounter for general adult medical examination without abnormal findings: Secondary | ICD-10-CM

## 2022-06-21 DIAGNOSIS — G620 Drug-induced polyneuropathy: Secondary | ICD-10-CM | POA: Diagnosis not present

## 2022-06-21 DIAGNOSIS — Z79899 Other long term (current) drug therapy: Secondary | ICD-10-CM

## 2022-06-21 DIAGNOSIS — D369 Benign neoplasm, unspecified site: Secondary | ICD-10-CM

## 2022-06-21 DIAGNOSIS — K224 Dyskinesia of esophagus: Secondary | ICD-10-CM | POA: Diagnosis not present

## 2022-06-21 DIAGNOSIS — K2100 Gastro-esophageal reflux disease with esophagitis, without bleeding: Secondary | ICD-10-CM

## 2022-06-21 DIAGNOSIS — Z853 Personal history of malignant neoplasm of breast: Secondary | ICD-10-CM

## 2022-06-21 DIAGNOSIS — G4701 Insomnia due to medical condition: Secondary | ICD-10-CM | POA: Diagnosis not present

## 2022-06-21 DIAGNOSIS — M5416 Radiculopathy, lumbar region: Secondary | ICD-10-CM

## 2022-06-21 DIAGNOSIS — M7061 Trochanteric bursitis, right hip: Secondary | ICD-10-CM

## 2022-06-21 DIAGNOSIS — T451X5A Adverse effect of antineoplastic and immunosuppressive drugs, initial encounter: Secondary | ICD-10-CM

## 2022-06-21 LAB — COMPREHENSIVE METABOLIC PANEL
ALT: 11 U/L (ref 0–35)
AST: 15 U/L (ref 0–37)
Albumin: 4.2 g/dL (ref 3.5–5.2)
Alkaline Phosphatase: 56 U/L (ref 39–117)
BUN: 13 mg/dL (ref 6–23)
CO2: 23 mEq/L (ref 19–32)
Calcium: 9.1 mg/dL (ref 8.4–10.5)
Chloride: 103 mEq/L (ref 96–112)
Creatinine, Ser: 0.73 mg/dL (ref 0.40–1.20)
GFR: 88.7 mL/min (ref >60.00)
Glucose, Bld: 107 mg/dL — ABNORMAL HIGH (ref 70–99)
Potassium: 3.8 mEq/L (ref 3.5–5.1)
Sodium: 140 mEq/L (ref 135–145)
Total Bilirubin: 0.4 mg/dL (ref 0.2–1.2)
Total Protein: 7.5 g/dL (ref 6.0–8.3)

## 2022-06-21 LAB — CBC WITH DIFFERENTIAL/PLATELET
Basophils Absolute: 0 10*3/uL (ref 0.0–0.1)
Basophils Relative: 0.2 % (ref 0.0–3.0)
Eosinophils Absolute: 0 10*3/uL (ref 0.0–0.7)
Eosinophils Relative: 0.8 % (ref 0.0–5.0)
HCT: 39.3 % (ref 36.0–46.0)
Hemoglobin: 12.5 g/dL (ref 12.0–15.0)
Lymphocytes Relative: 18.8 % (ref 12.0–46.0)
Lymphs Abs: 0.8 10*3/uL (ref 0.7–4.0)
MCHC: 31.9 g/dL (ref 30.0–36.0)
MCV: 86.3 fl (ref 78.0–100.0)
Monocytes Absolute: 0.4 10*3/uL (ref 0.1–1.0)
Monocytes Relative: 8.5 % (ref 3.0–12.0)
Neutro Abs: 3 10*3/uL (ref 1.4–7.7)
Neutrophils Relative %: 71.7 % (ref 43.0–77.0)
Platelets: 218 10*3/uL (ref 150.0–400.0)
RBC: 4.55 Mil/uL (ref 3.87–5.11)
RDW: 15.5 % (ref 11.5–15.5)
WBC: 4.2 10*3/uL (ref 4.0–10.5)

## 2022-06-21 LAB — VITAMIN B12: Vitamin B-12: 257 pg/mL (ref 211–911)

## 2022-06-21 LAB — LIPID PANEL
Cholesterol: 226 mg/dL — ABNORMAL HIGH (ref 0–200)
HDL: 63.1 mg/dL (ref >39.00)
LDL Cholesterol: 149 mg/dL — ABNORMAL HIGH (ref 0–99)
NonHDL: 162.86
Total CHOL/HDL Ratio: 4
Triglycerides: 70 mg/dL (ref 0.0–149.0)
VLDL: 14 mg/dL (ref 0.0–40.0)

## 2022-06-21 LAB — TSH: TSH: 1.32 u[IU]/mL (ref 0.35–5.50)

## 2022-06-21 MED ORDER — OMEPRAZOLE 40 MG PO CPDR: 40.0000 mg | DELAYED_RELEASE_CAPSULE | Freq: Every day | ORAL | 11 refills | Status: DC

## 2022-06-21 NOTE — Patient Instructions (Signed)

## 2022-06-21 NOTE — Progress Notes (Signed)
Subjective  Chief Complaint  Patient presents with   Annual Exam    Pt here for Annual Exam and is currently fasting     HPI: Misty Snyder is a 62 y.o. female who presents to [facility] [place] today for a Female Wellness Visit. She also has the concerns and/or needs as listed above in the chief complaint. These will be addressed in addition to the Health Maintenance Visit.   Wellness Visit: annual visit with health maintenance review and exam without Pap  Health maintenance: Pap smear up-to-date, normal but no transformational zone so we will repeat again next year.  May need to use vaginal estrogen prior due to atrophic vaginitis making exam limited.  Other screenings are up-to-date including colonoscopy and mammogram.  She works part-time at [employer].  Eye exam current.  To have cataract evaluation soon.  Immunizations current Chronic disease f/u and/or acute problem visit: (deemed necessary to be done in addition to the wellness visit): History of breast cancer: Completed treatment.  Normal mammogram History of low mood/depression: Engineer, drilling.  Did not take Lexapro.  Feels that she is doing better. GERD with history of esophagitis: On high-dose omeprazole 40 twice daily.  Still with some symptoms.  He has not been back to GI since 2021.  No worsening of symptoms.  No weight loss. Complains of right low back pain and leg pain.  Started about 4 to 5 months ago.  Worse with standing all day at work.  Thinks she may need work restrictions.  Complains of intermittent right low back pain, does have back history.  History of lumbar surgery.  Complains of radicular type pain down the back of the leg intermittently.  Worse with standing.  Also with lateral hip pain and pain at night while lying on that side.  Has trouble maneuvering in the bed due to pain.  In the morning she wakes up mostly pain-free.  Has chronic neuropathy controlled with gabapentin.  Numbness  and tingling symptoms are stable.  No bowel or bladder incontinence  Assessment  1. Annual physical exam   2. Chemotherapy-induced peripheral neuropathy (HCC)   3. History of right breast cancer 2016   4. Esophageal dysmotility   5. Insomnia due to medical condition   6. Adenomatous polyp   7. Long-term current use of proton pump inhibitor therapy   8. Gastroesophageal reflux disease with esophagitis without hemorrhage   9. Greater trochanteric bursitis of right hip   10. Right lumbar radiculopathy      Plan  Female Wellness Visit: Age appropriate Health Maintenance and Prevention measures were discussed with patient. Included topics are cancer screening recommendations, ways to keep healthy (see AVS) including dietary and exercise recommendations, regular eye and dental care, use of seat belts, and avoidance of moderate alcohol use and tobacco use.  BMI: discussed patient's BMI and encouraged positive lifestyle modifications to help get to or maintain a target BMI. HM needs and immunizations were addressed and ordered. See below for orders. See HM and immunization section for updates. Routine labs and screening tests ordered including cmp, cbc and lipids where appropriate. Discussed recommendations regarding Vit D and calcium supplementation (see AVS)  Chronic disease management visit and/or acute problem visit: Recommend consultation again with GI since dyspepsia symptoms remain despite high-dose omeprazole.  Try to decrease omeprazole dose to 40 mg daily and follow.  Check B12 levels Low back pain and bursitis: Education given.  Gave patient multiple treatment options: She looks  worse medicine referral.  Placed today. Insomnia is well-controlled on trazodone nightly.  100 mg Peripheral neuropathy is well-controlled on gabapentin 300 mg twice daily. Mood: Improved.  Will monitor.  Behavioral management strategies discussed.  Follow up: 12 months for complete physical Orders Placed  This Encounter  Procedures   CBC with Differential/Platelet   Comprehensive metabolic panel   Lipid panel   TSH   Vitamin B12   Ambulatory referral to Sports Medicine   Meds ordered this encounter  Medications   omeprazole (PRILOSEC) 40 MG capsule    Sig: Take 1 capsule (40 mg total) by mouth daily.    Dispense:  60 capsule    Refill:  11      Body mass index is 31.78 kg/m. Wt Readings from Last 3 Encounters:  06/21/22 179 lb 6.4 oz (81.4 kg)  10/04/21 178 lb (80.7 kg)  05/11/21 172 lb (78 kg)     Patient Active Problem List   Diagnosis Date Noted   Adenomatous polyp 03/03/2019    Priority: High    Colonoscopy 02/2019    Chemotherapy-induced peripheral neuropathy (HCC) 10/21/2014    Priority: High   History of right breast cancer 2016 05/29/2014    Priority: High   Reflux esophagitis 03/03/2019    Priority: Medium     EGD 02/2019, Dr. Russella Dar - Reflux esophagitis with no bleeding. Biopsied. - No endoscopic esophageal abnormality to explain patient's dysphagia. Esophagus dilated. - Multiple gastric polyps. Biopsied. - Granular, darkend, speckled mucosa in the second portion of the duodenum. Biopsied. - Normal duodenal bulb    Esophageal dysmotility 06/14/2016    Priority: Medium    Constipation, chronic 04/29/2015    Priority: Medium    Insomnia due to medical condition 05/07/2012    Priority: Medium     Menopausal and neuropathy. Started trazadone in 2021    Uterine fibroid 11/19/2008    Priority: Medium    Atrophic vaginitis 02/25/2019    Priority: Low   Vasomotor symptoms due to menopause 02/25/2019    Priority: Low   Recurrent depression (HCC) 05/11/2021   Genetic testing 06/25/2014    Negative; No pathogenic mutations BreastNext (17 genes) @ [facility]: ATM, BARD1, BRCA1, BRCA2, BRIP1, CDH1, CHEK2, MRE11A, MUTYH, NBN, NF1, PALB2, PTEN, RAD50, RAD51C, RAD51D, and TP53. Tested 06/03/14    Health Maintenance  Topic Date Due   COVID-19 Vaccine (1) 07/07/2022  (Originally 06/10/1961)   INFLUENZA VACCINE  08/17/2022   MAMMOGRAM  06/19/2023   PAP SMEAR-Modifier  05/11/2024   Colonoscopy  03/02/2026   DTaP/Tdap/Td (3 - Td or Tdap) 10/09/2029   Hepatitis C Screening  Completed   Zoster Vaccines- Shingrix  Completed   HPV VACCINES  Aged Out   HIV Screening  Discontinued   Immunization History  Administered Date(s) Administered   Influenza Inj Mdck Quad Pf 11/12/2021   Influenza Whole 11/19/2008, 10/08/2009   Influenza,inj,Quad PF,6+ Mos 10/16/2013, 12/02/2014, 10/24/2018, 10/10/2019, 10/25/2020   Influenza-Unspecified 11/09/2015   Pneumococcal Polysaccharide-23 02/16/2014   Td 10/08/2009   Tdap 10/10/2019   Zoster Recombinat (Shingrix) 04/26/2020, 07/01/2020   We updated and reviewed the patient's past history in detail and it is documented below. Allergies: Patient has No Known Allergies. Past Medical History Patient  has a past medical history of Breast cancer (HCC) (05/15/14), Depression, Personal history of chemotherapy, Reflux esophagitis (03/03/2019), Tubular adenoma of colon (03/03/2019), and Uterine fibroid. Past Surgical History Patient  has a past surgical history that includes Neck surgery; Mastectomy w/ sentinel node biopsy (Right,  06/18/2014); Insertion central venous access device w/ subcutaneous port (06/18/2014); Mastectomy w/ sentinel node biopsy (Right, 06/17/2014); Portacath placement (Left, 06/17/2014); and Mastectomy (Right, 2016). Family History: Patient family history includes Alcohol abuse in an other family member; Prostate cancer in her maternal grandfather. Social History:  Patient  reports that she has never smoked. She has never used smokeless tobacco. She reports that she does not drink alcohol and does not use drugs.  Review of Systems: Constitutional: negative for fever or malaise Ophthalmic: negative for photophobia, double vision or loss of vision Cardiovascular: negative for chest pain, dyspnea on exertion, or new  LE swelling Respiratory: negative for SOB or persistent cough Gastrointestinal: negative for abdominal pain, change in bowel habits or melena Genitourinary: negative for dysuria or gross hematuria, no abnormal uterine bleeding or disharge Musculoskeletal: negative for new gait disturbance or muscular weakness Integumentary: negative for new or persistent rashes, no breast lumps Neurological: negative for TIA or stroke symptoms Psychiatric: negative for SI or delusions Allergic/Immunologic: negative for hives  Patient Care Team    Relationship Specialty Notifications Start End  Willow Ora, MD PCP - General Family Medicine  10/24/18   Serena Croissant, MD Consulting Physician Hematology and Oncology  12/23/14   Manus Rudd, MD Consulting Physician General Surgery  12/23/14   Salomon Fick, NP Nurse Practitioner Hematology and Oncology  12/23/14    Comment: Survivorship  Meryl Dare, MD Consulting Physician Gastroenterology  10/24/18     Objective  Vitals: BP 138/88   Pulse 88   Temp 97.9 F (36.6 C)   Ht 5\' 3"  (1.6 m)   Wt 179 lb 6.4 oz (81.4 kg)   LMP 03/17/2011   SpO2 94%   BMI 31.78 kg/m  General:  Well developed, well nourished, no acute distress  Psych:  Alert and orientedx3,normal mood and affect HEENT:  Normocephalic, atraumatic, non-icteric sclera,  supple neck without adenopathy, mass or thyromegaly Cardiovascular:  Normal S1, S2, RRR without gallop, rub or murmur Respiratory:  Good breath sounds bilaterally, CTAB with normal respiratory effort Gastrointestinal: normal bowel sounds, soft, non-tender, no noted masses. No HSM MSK: extremities without edema, joints without erythema or swelling Low back: Right SI joint tender, no paravertebral muscle tenderness, range of motion is good.  Negative straight leg raise bilaterally, normal DTRs, right greater trochanteric bursa is tender, normal gait Neurologic:    Mental status is normal.  Gross motor and sensory  exams are normal.  No tremor  Commons side effects, risks, benefits, and alternatives for medications and treatment plan prescribed today were discussed, and the patient expressed understanding of the given instructions. Patient is instructed to call or message via MyChart if he/she has any questions or concerns regarding our treatment plan. No barriers to understanding were identified. We discussed Red Flag symptoms and signs in detail. Patient expressed understanding regarding what to do in case of urgent or emergency type symptoms.  Medication list was reconciled, printed and provided to the patient in AVS. Patient instructions and summary information was reviewed with the patient as documented in the AVS. This note was prepared with assistance of Dragon voice recognition software. Occasional wrong-word or sound-a-like substitutions may have occurred due to the inherent limitations of voice recognition software

## 2022-06-22 ENCOUNTER — Other Ambulatory Visit: Payer: Self-pay | Admitting: Family Medicine

## 2022-06-22 DIAGNOSIS — G620 Drug-induced polyneuropathy: Secondary | ICD-10-CM

## 2022-06-22 NOTE — Progress Notes (Signed)
See mychart note The 10-year ASCVD risk score (Arnett DK, et al., 2019) is: 7.2%   Values used to calculate the score:     Age: 62 years     Sex: Female     Is Non-Hispanic African American: Yes     Diabetic: No     Tobacco smoker: No     Systolic Blood Pressure: 138 mmHg     Is BP treated: No     HDL Cholesterol: 63.1 mg/dL     Total Cholesterol: 226 mg/dL

## 2022-06-26 ENCOUNTER — Telehealth: Payer: Self-pay | Admitting: Family Medicine

## 2022-06-26 NOTE — Telephone Encounter (Signed)
Pt would like a call back with lab results. Please call after 2pm.

## 2022-06-27 NOTE — Telephone Encounter (Signed)
Patient returned cal. Requests to be called re: A1C. If unable to answer, please leave detailed message on voice mail or call after 3:30 pm 06/27/22.

## 2022-06-27 NOTE — Telephone Encounter (Signed)
Mardelle Matte spoke with pt to explain her concerns

## 2022-07-24 ENCOUNTER — Encounter: Payer: Self-pay | Admitting: Family Medicine

## 2022-12-30 ENCOUNTER — Other Ambulatory Visit: Payer: Self-pay | Admitting: Family

## 2022-12-30 DIAGNOSIS — T451X5A Adverse effect of antineoplastic and immunosuppressive drugs, initial encounter: Secondary | ICD-10-CM

## 2023-01-04 ENCOUNTER — Other Ambulatory Visit: Payer: Self-pay | Admitting: Family Medicine

## 2023-02-13 ENCOUNTER — Encounter: Payer: Self-pay | Admitting: Dermatology

## 2023-02-13 ENCOUNTER — Ambulatory Visit (INDEPENDENT_AMBULATORY_CARE_PROVIDER_SITE_OTHER): Payer: 59 | Admitting: Dermatology

## 2023-02-13 VITALS — BP 119/78

## 2023-02-13 DIAGNOSIS — L658 Other specified nonscarring hair loss: Secondary | ICD-10-CM | POA: Diagnosis not present

## 2023-02-13 DIAGNOSIS — L219 Seborrheic dermatitis, unspecified: Secondary | ICD-10-CM

## 2023-02-13 MED ORDER — FLUOCINOLONE ACETONIDE BODY 0.01 % EX OIL: TOPICAL_OIL | CUTANEOUS | 0 refills | Status: AC

## 2023-02-13 MED ORDER — SAFETY SEAL MISCELLANEOUS MISC
2 refills | Status: AC
Start: 2023-02-13 — End: ?

## 2023-02-13 MED ORDER — CLOBETASOL PROPIONATE 0.05 % EX SOLN: 1.0000 | Freq: Two times a day (BID) | CUTANEOUS | 0 refills | Status: AC | PRN

## 2023-02-13 NOTE — Patient Instructions (Addendum)
Hello Misty Snyder,  Thank you for visiting [place] today. Here is a summary of the key instructions from today's consultation:  Hair Care and Styling:   Opt for smaller, lighter braids to reduce tension and avoid exacerbating hair loss.   Remove braids when at home to lessen scalp tension.  Medications and Treatments:   Clobetasol Solution: Apply daily as needed for itch and flake.   Minoxidil/Clobetasol Compound: Apply to the frontal hairline in the morning to aid hair regrowth.   DHS Zinc Shampoo: Use regularly, allowing it to sit for 2-3 minutes before rinsing.   Dermasmooth Oil (Fluocinonide Scalp Oil): Apply before shampooing, let sit for 20-30 minutes or all day if possible. A sample will be provided today.   Prescription Liquid Drops: Use daily initially for itching and flaking, then reduce frequency as symptoms improve.  Purchasing Instructions:   DHS Zinc Shampoo is available on [employer], [facility], or [facility] for approximately $10.  Follow-Up:   We will monitor your progress and adjust treatments as necessary. Expect a follow-up for ongoing assessment.  Educational Material:   You will receive an after-visit summary and pictures of recommended products.  Thank you once again for your visit. Please follow the instructions closely to achieve the best outcomes. We look forward to seeing you again soon.  Warm regards,  Dr. Langston Reusing,  Dermatology        Important Information  Due to recent changes in healthcare laws, you may see results of your pathology and/or laboratory studies on MyChart before the doctors have had a chance to review them. We understand that in some cases there may be results that are confusing or concerning to you. Please understand that not all results are received at the same time and often the doctors may need to interpret multiple results in order to provide you with the best plan of care or course of treatment. Therefore, we ask that you  please give [place] 2 business days to thoroughly review all your results before contacting the office for clarification. Should we see a critical lab result, you will be contacted sooner.   If You Need Anything After Your Visit  If you have any questions or concerns for your doctor, please call our main line at [phone] If no one answers, please leave a voicemail as directed and we will return your call as soon as possible. Messages left after 4 pm will be answered the following business day.   You may also send [place] a message via MyChart. We typically respond to MyChart messages within 1-2 business days.  For prescription refills, please ask your pharmacy to contact our office. Our fax number is [phone].  If you have an urgent issue when the clinic is closed that cannot wait until the next business day, you can page your doctor at the number below.    Please note that while we do our best to be available for urgent issues outside of office hours, we are not available 24/7.   If you have an urgent issue and are unable to reach [place], you may choose to seek medical care at your doctor's office, retail clinic, urgent care center, or emergency room.  If you have a medical emergency, please immediately call 911 or go to the emergency department. In the event of inclement weather, please call our main line at [phone] for an update on the status of any delays or closures.  Dermatology Medication Tips: Please keep the boxes that topical  medications come in in order to help keep track of the instructions about where and how to use these. Pharmacies typically print the medication instructions only on the boxes and not directly on the medication tubes.   If your medication is too expensive, please contact our office at [phone] or send [place] a message through MyChart.   We are unable to tell what your co-pay for medications will be in advance as this is different depending on your insurance  coverage. However, we may be able to find a substitute medication at lower cost or fill out paperwork to get insurance to cover a needed medication.   If a prior authorization is required to get your medication covered by your insurance company, please allow [place] 1-2 business days to complete this process.  Drug prices often vary depending on where the prescription is filled and some pharmacies may offer cheaper prices.  The website www.goodrx.com contains coupons for medications through different pharmacies. The prices here do not account for what the cost may be with help from insurance (it may be cheaper with your insurance), but the website can give you the price if you did not use any insurance.  - You can print the associated coupon and take it with your prescription to the pharmacy.  - You may also stop by our office during regular business hours and pick up a GoodRx coupon card.  - If you need your prescription sent electronically to a different pharmacy, notify our office through [facility] or by phone at [phone]

## 2023-02-13 NOTE — Progress Notes (Signed)
   New Patient Visit   Subjective  Misty Snyder is a 63 y.o. female who presents for the following: New Pt - Seb Derm  Patient states she has Seb Derm located at the scalp that she would like to have examined. Patient reports the areas have been there for 4 years but has gotten progressively worse. She reports the areas are bothersome.Patient rates irritation (itching) 8 out of 10. She notice intense itching when hair stylist apply oils and products to the scalp. She states that the areas have not spread. Patient reports she has not previously been treated for these areas. Patient denies Hx of bx. Patient denies family history of skin cancer(s).  The patient has spots, moles and lesions to be evaluated, some may be new or changing and the patient may have concern these could be cancer.   The following portions of the chart were reviewed this encounter and updated as appropriate: medications, allergies, medical history  Review of Systems:  No other skin or systemic complaints except as noted in HPI or Assessment and Plan.  Objective  Well appearing patient in no apparent distress; mood and affect are within normal limits.   A focused examination was performed of the following areas: scalp   Relevant exam findings are noted in the Assessment and Plan.           Assessment & Plan   SEBORRHEIC DERMATITIS Exam: Pink patches with greasy scale at scalp  Flared  Seborrheic Dermatitis is a chronic persistent rash characterized by pinkness and scaling most commonly of the mid face but also can occur on the scalp (dandruff), ears; mid chest, mid back and groin.  It tends to be exacerbated by stress and cooler weather.  People who have neurologic disease may experience new onset or exacerbation of existing seborrheic dermatitis.  The condition is not curable but treatable and can be controlled.  Treatment Plan: - Rx Clobetasol solution - apply to affected areas PRN. - Recommended  DHS Zinc Shampoo - let sit for 2-83min then rinse. Follow with hydrating shampoo and conditioner. Pic included in AVS.   - Encouraged pt to wash hair every 1-2 weeks.  - Rx DermaSmoothe - apply to scalp the night prior to washing or a few hours prior to salon appointment.    Traction Alopecia Assessment: Patient presents with a receding hairline. 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  Plan:   Prescribe clobetasol and minoxidil regimen for hair regrowth and inflammation reduction. Apply medication to the frontal hairline in the morning.   Recommend smaller braids, not extending past shoulder length, and advise patient to remove braids when at home to reduce tension.   Follow up in 6 months to assess results.   Take baseline photographs for chart documentation.    No follow-ups on file.    Documentation: I have reviewed the above documentation for accuracy and completeness, and I agree with the above.   I, Shirron Marcha Solders, CMA, am acting as scribe for [employer], DO.   Langston Reusing, DO

## 2023-04-23 ENCOUNTER — Other Ambulatory Visit: Payer: Self-pay | Admitting: Family Medicine

## 2023-04-23 DIAGNOSIS — Z1231 Encounter for screening mammogram for malignant neoplasm of breast: Secondary | ICD-10-CM

## 2023-05-01 ENCOUNTER — Ambulatory Visit: Payer: Self-pay | Admitting: Family Medicine

## 2023-05-01 NOTE — Telephone Encounter (Signed)
 Copied from CRM [id number]. Topic: Clinical - Red Word Triage >> May 01, 2023  3:22 PM Orien Bird wrote: [facility] that prompted transfer to Nurse Triage: patient stated she has had a lingering cough for over month and tightness in her chest. She stated  she was running a fever but not anymore.   Chief Complaint: Cough Symptoms: lingering cough, intermittent chest tightness Frequency: constant Pertinent Negatives: Patient denies fever Disposition: [] ED /[] Urgent Care (no appt availability in office) / [x] Appointment(In office/virtual)/ []  Early Virtual Care/ [] Home Care/ [] Refused Recommended Disposition /[] [facility]/ []  Follow-up with PCP Additional Notes: pt reports cough  4 weeks, states that her symptoms started as viral with fever, body aches, and congestion but those symptoms have gotten better. The cough still remains and now she has been eperiencing intermittent chest tightness. Pt denies shortness of breath. Appt scheduled with Trevor Fudge, NP for 4/17 at 4:30p Reason for Disposition  Cough has been present for > 3 weeks  Answer Assessment - Initial Assessment Questions 1. ONSET: "When did the cough begin?"      Started over a month.   2. SEVERITY: "How bad is the cough today?"      Lingering  3. SPUTUM: "Describe the color of your sputum" (none, dry cough; clear, white, yellow, green)     Clear  4. HEMOPTYSIS: "Are you coughing up any blood?" If so ask: "How much?" (flecks, streaks, tablespoons, etc.)     No  5. DIFFICULTY BREATHING: "Are you having difficulty breathing?" If Yes, ask: "How bad is it?" (e.g., mild, moderate, severe)    - MILD: No SOB at rest, mild SOB with walking, speaks normally in sentences, can lie down, no retractions, pulse < 100.    - MODERATE: SOB at rest, SOB with minimal exertion and prefers to sit, cannot lie down flat, speaks in phrases, mild retractions, audible wheezing, pulse 100-120.    - SEVERE: Very SOB at rest, speaks in single  words, struggling to breathe, sitting hunched forward, retractions, pulse > 120      No  6. FEVER: "Do you have a fever?" If Yes, ask: "What is your temperature, how was it measured, and when did it start?"     No  7. CARDIAC HISTORY: "Do you have any history of heart disease?" (e.g., heart attack, congestive heart failure)      No  8. LUNG HISTORY: "Do you have any history of lung disease?"  (e.g., pulmonary embolus, asthma, emphysema)     No  9. PE RISK FACTORS: "Do you have a history of blood clots?" (or: recent major surgery, recent prolonged travel, bedridden)     No  10. OTHER SYMPTOMS: "Do you have any other symptoms?" (e.g., runny nose, wheezing, chest pain)       Chest tightness  11. PREGNANCY: "Is there any chance you are pregnant?" "When was your last menstrual period?"       No  12. TRAVEL: "Have you traveled out of the country in the last month?" (e.g., travel history, exposures)       No  Protocols used: Cough - Acute Productive-A-AH

## 2023-05-01 NOTE — Telephone Encounter (Signed)
 Noted.

## 2023-05-03 ENCOUNTER — Ambulatory Visit: Admitting: Family

## 2023-05-03 ENCOUNTER — Telehealth: Payer: Self-pay

## 2023-05-03 NOTE — Telephone Encounter (Signed)
 Called Patient-confirmed appointment 05/09/23 with Versa Gore.

## 2023-05-03 NOTE — Telephone Encounter (Signed)
 Copied from CRM [id number]. Topic: General - Other >> May 02, 2023  3:01 PM Abigail D wrote: Reason for CRM: Patient calling back in regards to VM asking for more information, CAL not available at time of call. I saw that the pt was triaged yesterday, but if more information is needed call patient back - she has rescheduled to 04/22 as well per her request.  Please call pt back to get her scheduled. Pt stated that someone called her yesterday to get her scheduled for 4/23 but I see in the note that the pt was scheduled for 4/22. Please contact pt to clarify.  Thank you,  [place]

## 2023-05-09 ENCOUNTER — Encounter: Payer: Self-pay | Admitting: Family

## 2023-05-09 ENCOUNTER — Ambulatory Visit (INDEPENDENT_AMBULATORY_CARE_PROVIDER_SITE_OTHER): Admitting: Family

## 2023-05-09 VITALS — BP 115/73 | HR 73 | Temp 97.0°F | Ht 63.0 in | Wt 176.4 lb

## 2023-05-09 DIAGNOSIS — R053 Chronic cough: Secondary | ICD-10-CM | POA: Diagnosis not present

## 2023-05-09 MED ORDER — GUAIFENESIN-CODEINE 100-10 MG/5ML PO SOLN: 5.0000 mL | Freq: Every evening | ORAL | 0 refills | Status: DC | PRN

## 2023-05-09 MED ORDER — GUAIFENESIN-CODEINE 100-10 MG/5ML PO SOLN
5.0000 mL | Freq: Three times a day (TID) | ORAL | 0 refills | Status: DC | PRN
Start: 2023-05-09 — End: 2023-05-09

## 2023-05-09 MED ORDER — BENZONATATE 200 MG PO CAPS
200.0000 mg | ORAL_CAPSULE | Freq: Three times a day (TID) | ORAL | 0 refills | Status: AC | PRN
Start: 2023-05-09 — End: 2023-05-19

## 2023-05-09 NOTE — Progress Notes (Signed)
 Patient ID: Misty Snyder, female    DOB: 07-18-1960, 63 y.o.   MRN: [id number]  Chief Complaint  Patient presents with   Cough    Pt c/o dry Cough and chest tightness for 1 month. Has tried mucinex , robitussin and nyquil which does not help sx.   Discussed the use of AI scribe software for clinical note transcription with the patient, who gave verbal consent to proceed.  History of Present Illness The patient presents with a persistent, dry cough that began approximately a month ago following a week-long episode of body aches and fever. The cough is described as non-productive, with very little mucus expectorated. The patient denies frequent throat clearing and denies any chest tightness, SOB, or hx of Asthma. She has no history of allergies or having long episodes of persistent cough in the past. The patient also reports a history of gastroesophageal reflux disease (GERD), currently managed with omeprazole  40mg  twice daily. She reports a significant improvement in symptoms, with just occasional complaints of heartburn. In addition to the cough, the patient has been experiencing sciatica for the past three weeks. She reports no recent prolonged periods of sitting or weight gain, which are common triggers for sciatica.   Assessment & Plan Chronic cough Lungs clear on exam. Persistent dry cough over a month. Possible causes include allergies or postnasal drip. GERD managed with omeprazole , reducing reflux likelihood. Allergic rhinitis suspected due to environmental factors and nasal discharge. - Prescribe Tessalon  Perles to decrease cough intensity and frequency. - Prescribe cough syrup with codeine  for nighttime use to aid sleep. - Consider OTC generic Nasacort if symptoms persist after another week.  Allergic rhinitis Recent clear nasal discharge likely due to environmental allergens. Symptoms suggest allergic rhinitis exacerbated by pollen. - Advise continuing to wear a mask outdoors  and keeping windows closed to minimize allergen exposure.  Sciatica Sciatic nerve pain for three weeks, possibly exacerbated by prolonged sitting or recent weight gain. - Recommend Aleve  (naproxen ) one or two tablets twice daily for pain management. - Consider physical therapy for exercises and stretches to alleviate symptoms.   Subjective:    Outpatient Medications Prior to Visit  Medication Sig Dispense Refill   clobetasol  (TEMOVATE ) 0.05 % external solution Apply 1 Application topically 2 (two) times daily as needed. 50 mL 0   Fluocinolone  Acetonide Body (DERMA-SMOOTHE /FS BODY) 0.01 % OIL Apply to the hair and scalp the night prior to washing hair. Rinse out with hair washing. 118.28 mL 0   gabapentin  (NEURONTIN ) 300 MG capsule TAKE 1 CAPSULE BY MOUTH TWICE A DAY 180 capsule 3   naproxen  (NAPROSYN ) 500 MG tablet TAKE 1 TABLET BY MOUTH TWICE A DAY AS NEEDED 60 tablet 0   omeprazole  (PRILOSEC) 40 MG capsule Take 1 capsule (40 mg total) by mouth daily. 60 capsule 11   Safety Seal Miscellaneous MISC AA Gel with minoxidil USP 10% and clobetasol  USP 0.05% - use daily QAM on affected areas 30 g 2   traZODone  (DESYREL ) 50 MG tablet TAKE 2 TABLETS (100 MG TOTAL) BY MOUTH AT BEDTIME AS NEEDED FOR SLEEP. 180 tablet 3   No facility-administered medications prior to visit.   Past Medical History:  Diagnosis Date   Breast cancer (HCC) 05/15/14   right breast invasive ductal carcinoma   Depression    Personal history of chemotherapy    Reflux esophagitis 03/03/2019   egd 02/2019   Tubular adenoma of colon 03/03/2019   Colonoscopy 02/2019   Uterine fibroid  Past Surgical History:  Procedure Laterality Date   INSERTION CENTRAL VENOUS ACCESS DEVICE W/ SUBCUTANEOUS PORT  06/18/2014   MASTECTOMY Right 2016   MASTECTOMY W/ SENTINEL NODE BIOPSY Right 06/18/2014   WITH PORT A CATH PLACEMENT   MASTECTOMY W/ SENTINEL NODE BIOPSY Right 06/17/2014   Procedure: RIGHT MASTECTOMY WITH SENTINEL LYMPH NODE  BIOPSY WITH BLUE DYE MAPPING AND PORT PLACEMENT;  Surgeon: Dareen Ebbing, MD;  Location: [facility] OR;  Service: General;  Laterality: Right;   NECK SURGERY     PORTACATH PLACEMENT Left 06/17/2014   Procedure: INSERTION PORT-A-CATH;  Surgeon: Dareen Ebbing, MD;  Location: [facility] OR;  Service: General;  Laterality: Left;   No Known Allergies    Objective:    Physical Exam Vitals and nursing note reviewed.  Constitutional:      Appearance: Normal appearance.  HENT:     Right Ear: Tympanic membrane and ear canal normal.     Left Ear: Tympanic membrane and ear canal normal.     Nose: No congestion or rhinorrhea.     Mouth/Throat:     Mouth: Mucous membranes are moist.     Pharynx: Postnasal drip (mild) present. No pharyngeal swelling, oropharyngeal exudate, posterior oropharyngeal erythema or uvula swelling.     Tonsils: No tonsillar exudate or tonsillar abscesses.  Cardiovascular:     Rate and Rhythm: Normal rate and regular rhythm.  Pulmonary:     Effort: Pulmonary effort is normal.     Breath sounds: Normal breath sounds.  Musculoskeletal:        General: Normal range of motion.  Skin:    General: Skin is warm and dry.  Neurological:     Mental Status: She is alert.  Psychiatric:        Mood and Affect: Mood normal.        Behavior: Behavior normal.    BP 115/73 (BP Location: Left Arm, Patient Position: Sitting, Cuff Size: Large)   Pulse 73   Temp (!) 97 F (36.1 C) (Temporal)   Ht 5\' 3"  (1.6 m)   Wt 176 lb 6 oz (80 kg)   LMP 03/17/2011   SpO2 100%   BMI 31.24 kg/m  Wt Readings from Last 3 Encounters:  05/09/23 176 lb 6 oz (80 kg)  06/21/22 179 lb 6.4 oz (81.4 kg)  10/04/21 178 lb (80.7 kg)       Versa Gore, NP

## 2023-06-13 ENCOUNTER — Telehealth: Payer: Self-pay

## 2023-06-13 NOTE — Telephone Encounter (Signed)
 Copied from CRM [id number]. Topic: Clinical - Prescription Issue >> Jun 13, 2023  2:15 PM Misty Snyder wrote: Reason for CRM: omeprazole  (PRILOSEC) 40 MG capsule patient received a notice they need a prior authorization at the pharmacy. Also noted she would like to pick up only 30 days instead of 90 due to the co  Can we run a PA for medication on a 30 day supply and see the cost  Thank you,  [employer]

## 2023-06-14 ENCOUNTER — Other Ambulatory Visit (HOSPITAL_COMMUNITY): Payer: Self-pay

## 2023-06-18 ENCOUNTER — Telehealth: Payer: Self-pay

## 2023-06-18 ENCOUNTER — Other Ambulatory Visit: Payer: Self-pay | Admitting: Family Medicine

## 2023-06-18 ENCOUNTER — Other Ambulatory Visit: Payer: Self-pay

## 2023-06-18 NOTE — Telephone Encounter (Signed)
 Copied from CRM [id number]. Topic: Clinical - Prescription Issue >> Jun 18, 2023  9:22 AM Juluis Ok wrote: Reason for CRM: Patient states that she contacted her pharmacy again to pick up her medication, omeprazole  (PRILOSEC) 40 MG capsule, however they are not allowing her to pickup her medication due to it needing a PA. Patient is requesting a callback.  Can we run a PA on the Rx.  Thank you,  [place]

## 2023-06-19 ENCOUNTER — Telehealth: Payer: Self-pay

## 2023-06-19 ENCOUNTER — Other Ambulatory Visit (HOSPITAL_COMMUNITY): Payer: Self-pay

## 2023-06-19 NOTE — Telephone Encounter (Signed)
 Pharmacy Patient Advocate Encounter   Received notification from Pt Calls Messages that prior authorization for Omeprazole  40mg  caps is required/requested.   Insurance verification completed.   The patient is insured through [employer] .   Per test claim: Refill too soon. PA is not needed at this time. Medication was filled 06/19/23. Next eligible fill date is 07/23/23.   Placed a call to [facility] and they are filling the medication for today but they had to change the quantity for it to go through. No PA needed at this time.

## 2023-06-22 ENCOUNTER — Encounter: Admitting: Family Medicine

## 2023-06-25 ENCOUNTER — Ambulatory Visit
Admission: RE | Admit: 2023-06-25 | Discharge: 2023-06-25 | Disposition: A | Discharge status: Home / Self Care | Source: Ambulatory Visit | Attending: Family Medicine | Admitting: Family Medicine

## 2023-06-25 DIAGNOSIS — Z1231 Encounter for screening mammogram for malignant neoplasm of breast: Secondary | ICD-10-CM

## 2023-07-23 ENCOUNTER — Encounter: Payer: Self-pay | Admitting: Family Medicine

## 2023-07-23 ENCOUNTER — Ambulatory Visit (INDEPENDENT_AMBULATORY_CARE_PROVIDER_SITE_OTHER): Admitting: Family Medicine

## 2023-07-23 VITALS — BP 118/76 | HR 89 | Temp 97.9°F | Ht 63.0 in | Wt 172.6 lb

## 2023-07-23 DIAGNOSIS — G620 Drug-induced polyneuropathy: Secondary | ICD-10-CM | POA: Diagnosis not present

## 2023-07-23 DIAGNOSIS — G4701 Insomnia due to medical condition: Secondary | ICD-10-CM

## 2023-07-23 DIAGNOSIS — R7303 Prediabetes: Secondary | ICD-10-CM

## 2023-07-23 DIAGNOSIS — Z0001 Encounter for general adult medical examination with abnormal findings: Secondary | ICD-10-CM

## 2023-07-23 DIAGNOSIS — K2100 Gastro-esophageal reflux disease with esophagitis, without bleeding: Secondary | ICD-10-CM | POA: Diagnosis not present

## 2023-07-23 DIAGNOSIS — Z853 Personal history of malignant neoplasm of breast: Secondary | ICD-10-CM | POA: Diagnosis not present

## 2023-07-23 DIAGNOSIS — K5909 Other constipation: Secondary | ICD-10-CM

## 2023-07-23 DIAGNOSIS — Z0000 Encounter for general adult medical examination without abnormal findings: Secondary | ICD-10-CM

## 2023-07-23 DIAGNOSIS — E538 Deficiency of other specified B group vitamins: Secondary | ICD-10-CM

## 2023-07-23 DIAGNOSIS — K224 Dyskinesia of esophagus: Secondary | ICD-10-CM

## 2023-07-23 DIAGNOSIS — D369 Benign neoplasm, unspecified site: Secondary | ICD-10-CM

## 2023-07-23 DIAGNOSIS — Z13220 Encounter for screening for lipoid disorders: Secondary | ICD-10-CM | POA: Diagnosis not present

## 2023-07-23 MED ORDER — PANTOPRAZOLE SODIUM 40 MG PO TBEC: 40.0000 mg | DELAYED_RELEASE_TABLET | Freq: Two times a day (BID) | ORAL | 3 refills | Status: AC

## 2023-07-23 NOTE — Progress Notes (Signed)
 Subjective  Chief Complaint  Patient presents with   Annual Exam    HPI: Misty Snyder is a 63 y.o. female who presents to [facility] [place] today for a Female Wellness Visit. She also has the concerns and/or needs as listed above in the chief complaint. These will be addressed in addition to the Health Maintenance Visit.   Wellness Visit: annual visit with health maintenance review and exam  HM: screens  current: Colonoscopy again due in 2028 and q. 7-year surveillance due to history of adenomatous polyps., Pap smear up-to-date and mammogram current.  Immunizations up-to-date.  Eye exam current.  Exercising regularly, walking 4 miles 3 times a week.  Just started strength training.  Feels good.  Scheduled for cataract surgery. Chronic disease f/u and/or acute problem visit: (deemed necessary to be done in addition to the wellness visit): GERD history of esophageal reflux: Omeprazole  high dose 40 twice daily with some breakthrough symptoms.  He has not tried other PPIs.  No melena.  No hematemesis.  No weight loss. History of breast cancer: Mammograms are current.  Treatment completed. Prediabetes: She has improved her diet and is exercising regularly.  No symptoms of hyperglycemia.  Nonfasting today. Mildly low B12 last year, now on daily supplements. Insomnia responsive to trazodone .  Assessment  1. Encounter for well adult exam with abnormal findings   2. Chemotherapy-induced peripheral neuropathy (HCC)   3. Constipation, chronic   4. History of right breast cancer 2016   5. Esophageal dysmotility   6. Prediabetes   7. Vitamin B12 deficiency   8. Insomnia due to medical condition   9. Gastroesophageal reflux disease with esophagitis without hemorrhage      Plan  Female Wellness Visit: Age appropriate Health Maintenance and Prevention measures were discussed with patient. Included topics are cancer screening recommendations, ways to keep healthy (see AVS)  including dietary and exercise recommendations, regular eye and dental care, use of seat belts, and avoidance of moderate alcohol use and tobacco use.  Screens are current BMI: discussed patient's BMI and encouraged positive lifestyle modifications to help get to or maintain a target BMI. HM needs and immunizations were addressed and ordered. See below for orders. See HM and immunization section for updates. Routine labs and screening tests ordered including cmp, cbc and lipids where appropriate. Discussed recommendations regarding Vit D and calcium supplementation (see AVS)  Chronic disease management visit and/or acute problem visit: GERD: Trial of Protonix  40 twice daily. Adenomatous polyp for colonoscopy in 2028 Prediabetes: Following healthy diet.  Will recheck A1c today. Recheck B12 levels on oral daily supplements History of breast cancer: Screens are current. Insomnia is well-controlled  Follow up: 1 year for complete physical Orders Placed This Encounter  Procedures   TSH   VITAMIN D  25 Hydroxy (Vit-D Deficiency, Fractures)   CBC with Differential/Platelet   Comprehensive metabolic panel with GFR   Lipid panel   Vitamin B12   Hemoglobin A1c   Meds ordered this encounter  Medications   pantoprazole  (PROTONIX ) 40 MG tablet    Sig: Take 1 tablet (40 mg total) by mouth 2 (two) times daily.    Dispense:  180 tablet    Refill:  3      Body mass index is 30.57 kg/m. Wt Readings from Last 3 Encounters:  07/23/23 172 lb 9.6 oz (78.3 kg)  05/09/23 176 lb 6 oz (80 kg)  06/21/22 179 lb 6.4 oz (81.4 kg)     Patient Active  Problem List   Diagnosis Date Noted   Adenomatous polyp 03/03/2019    Priority: High    Colonoscopy 02/2019    Chemotherapy-induced peripheral neuropathy (HCC) 10/21/2014    Priority: High   History of right breast cancer 2016 05/29/2014    Priority: High   Reflux esophagitis 03/03/2019    Priority: Medium     EGD 02/2019, Dr. Aneita - Reflux  esophagitis with no bleeding. Biopsied. - No endoscopic esophageal abnormality to explain patient's dysphagia. Esophagus dilated. - Multiple gastric polyps. Biopsied. - Granular, darkend, speckled mucosa in the second portion of the duodenum. Biopsied. - Normal duodenal bulb    Esophageal dysmotility 06/14/2016    Priority: Medium    Constipation, chronic 04/29/2015    Priority: Medium    Insomnia due to medical condition 05/07/2012    Priority: Medium     Menopausal and neuropathy. Started trazadone in 2021    Uterine fibroid 11/19/2008    Priority: Medium    Atrophic vaginitis 02/25/2019    Priority: Low   Vasomotor symptoms due to menopause 02/25/2019    Priority: Low   Recurrent depression (HCC) 05/11/2021   Genetic testing 06/25/2014    Negative; No pathogenic mutations BreastNext (17 genes) @ [employer]: ATM, BARD1, BRCA1, BRCA2, BRIP1, CDH1, CHEK2, MRE11A, MUTYH, NBN, NF1, PALB2, PTEN, RAD50, RAD51C, RAD51D, and TP53. Tested 06/03/14    Health Maintenance  Topic Date Due   COVID-19 Vaccine (1) 08/08/2023 (Originally 12/11/1965)   INFLUENZA VACCINE  08/17/2023   MAMMOGRAM  06/24/2024   Colonoscopy  03/02/2026   Cervical Cancer Screening (HPV/Pap Cotest)  05/12/2026   DTaP/Tdap/Td (3 - Td or Tdap) 10/09/2029   Hepatitis C Screening  Completed   Zoster Vaccines- Shingrix  Completed   Pneumococcal Vaccine 50-62 Years old  Aged Out   Hepatitis B Vaccines  Aged Out   HPV VACCINES  Aged Out   Meningococcal B Vaccine  Aged Out   HIV Screening  Discontinued   Immunization History  Administered Date(s) Administered   Influenza Inj Mdck Quad Pf 11/12/2021   Influenza Whole 11/19/2008, 10/08/2009   Influenza,inj,Quad PF,6+ Mos 10/16/2013, 12/02/2014, 10/24/2018, 10/10/2019, 10/25/2020   Influenza-Unspecified 11/09/2015   Pneumococcal Polysaccharide-23 02/16/2014   Td 10/08/2009   Tdap 10/10/2019   Zoster Recombinant(Shingrix) 04/26/2020, 07/01/2020   We updated and reviewed  the patient's past history in detail and it is documented below. Allergies: Patient has no known allergies. Past Medical History Patient  has a past medical history of Breast cancer (HCC) (05/15/14), Depression, Personal history of chemotherapy, Reflux esophagitis (03/03/2019), Tubular adenoma of colon (03/03/2019), and Uterine fibroid. Past Surgical History Patient  has a past surgical history that includes Neck surgery; Mastectomy w/ sentinel node biopsy (Right, 06/18/2014); Insertion central venous access device w/ subcutaneous port (06/18/2014); Mastectomy w/ sentinel node biopsy (Right, 06/17/2014); Portacath placement (Left, 06/17/2014); and Mastectomy (Right, 2016). Family History: Patient family history includes Alcohol abuse in an other family member; Prostate cancer in her maternal grandfather. Social History:  Patient  reports that she has never smoked. She has never used smokeless tobacco. She reports that she does not drink alcohol and does not use drugs.  Review of Systems: Constitutional: negative for fever or malaise Ophthalmic: negative for photophobia, double vision or loss of vision Cardiovascular: negative for chest pain, dyspnea on exertion, or new LE swelling Respiratory: negative for SOB or persistent cough Gastrointestinal: negative for abdominal pain, change in bowel habits or melena Genitourinary: negative for dysuria or gross hematuria, no abnormal  uterine bleeding or disharge Musculoskeletal: negative for new gait disturbance or muscular weakness Integumentary: negative for new or persistent rashes, no breast lumps Neurological: negative for TIA or stroke symptoms Psychiatric: negative for SI or delusions Allergic/Immunologic: negative for hives  Patient Care Team    Relationship Specialty Notifications Start End  Jodie Lavern CROME, MD PCP - General Family Medicine  10/24/18   Odean Potts, MD Consulting Physician Hematology and Oncology  12/23/14   Belinda Cough, MD  Consulting Physician General Surgery  12/23/14   Moses Powell Hummer, NP Nurse Practitioner Hematology and Oncology  12/23/14    Comment: Survivorship  Aneita Gwendlyn DASEN, MD (Inactive) Consulting Physician Gastroenterology  10/24/18     Objective  Vitals: BP 118/76   Pulse 89   Temp 97.9 F (36.6 C)   Ht 5' 3 (1.6 m)   Wt 172 lb 9.6 oz (78.3 kg)   LMP 03/17/2011   SpO2 97%   BMI 30.57 kg/m  General:  Well developed, well nourished, no acute distress  Psych:  Alert and orientedx3,normal mood and affect HEENT:  Normocephalic, atraumatic, non-icteric sclera,  supple neck without adenopathy, mass or thyromegaly Cardiovascular:  Normal S1, S2, RRR without gallop, rub or murmur Respiratory:  Good breath sounds bilaterally, CTAB with normal respiratory effort Gastrointestinal: normal bowel sounds, soft, non-tender, no noted masses. No HSM MSK: extremities without edema, joints without erythema or swelling Neurologic:    Mental status is normal.  Gross motor and sensory exams are normal.  No tremor  Commons side effects, risks, benefits, and alternatives for medications and treatment plan prescribed today were discussed, and the patient expressed understanding of the given instructions. Patient is instructed to call or message via MyChart if he/she has any questions or concerns regarding our treatment plan. No barriers to understanding were identified. We discussed Red Flag symptoms and signs in detail. Patient expressed understanding regarding what to do in case of urgent or emergency type symptoms.  Medication list was reconciled, printed and provided to the patient in AVS. Patient instructions and summary information was reviewed with the patient as documented in the AVS. This note was prepared with assistance of Dragon voice recognition software. Occasional wrong-word or sound-a-like substitutions may have occurred due to the inherent limitations of voice recognition software

## 2023-07-23 NOTE — Patient Instructions (Signed)
 Please return in 12 months for your annual complete physical; please come fasting. For follow up on chronic medical conditions   I will release your lab results to you on your MyChart account with further instructions. You may see the results before I do, but when I review them I will send you a message with my report or have my assistant call you if things need to be discussed. Please reply to my message with any questions. Thank you!   If you have any questions or concerns, please don't hesitate to send me a message via MyChart or call the office at [phone]. Thank you for visiting with us  today! It's our pleasure caring for you.

## 2023-07-24 LAB — COMPREHENSIVE METABOLIC PANEL WITH GFR
ALT: 12 U/L (ref 0–35)
AST: 18 U/L (ref 0–37)
Albumin: 4.3 g/dL (ref 3.5–5.2)
Alkaline Phosphatase: 49 U/L (ref 39–117)
BUN: 12 mg/dL (ref 6–23)
CO2: 28 meq/L (ref 19–32)
Calcium: 9.1 mg/dL (ref 8.4–10.5)
Chloride: 103 meq/L (ref 96–112)
Creatinine, Ser: 0.75 mg/dL (ref 0.40–1.20)
GFR: 85.21 mL/min (ref >60.00)
Glucose, Bld: 81 mg/dL (ref 70–99)
Potassium: 3.4 meq/L — ABNORMAL LOW (ref 3.5–5.1)
Sodium: 140 meq/L (ref 135–145)
Total Bilirubin: 0.4 mg/dL (ref 0.2–1.2)
Total Protein: 7.7 g/dL (ref 6.0–8.3)

## 2023-07-24 LAB — CBC WITH DIFFERENTIAL/PLATELET
Basophils Absolute: 0 K/uL (ref 0.0–0.1)
Basophils Relative: 0.7 % (ref 0.0–3.0)
Eosinophils Absolute: 0 K/uL (ref 0.0–0.7)
Eosinophils Relative: 1.1 % (ref 0.0–5.0)
HCT: 39.2 % (ref 36.0–46.0)
Hemoglobin: 12.7 g/dL (ref 12.0–15.0)
Lymphocytes Relative: 22.4 % (ref 12.0–46.0)
Lymphs Abs: 0.9 K/uL (ref 0.7–4.0)
MCHC: 32.5 g/dL (ref 30.0–36.0)
MCV: 84.5 fl (ref 78.0–100.0)
Monocytes Absolute: 0.3 K/uL (ref 0.1–1.0)
Monocytes Relative: 8.7 % (ref 3.0–12.0)
Neutro Abs: 2.6 K/uL (ref 1.4–7.7)
Neutrophils Relative %: 67.1 % (ref 43.0–77.0)
Platelets: 222 K/uL (ref 150.0–400.0)
RBC: 4.64 Mil/uL (ref 3.87–5.11)
RDW: 16 % — ABNORMAL HIGH (ref 11.5–15.5)
WBC: 3.9 K/uL — ABNORMAL LOW (ref 4.0–10.5)

## 2023-07-24 LAB — LIPID PANEL
Cholesterol: 239 mg/dL — ABNORMAL HIGH (ref 0–200)
HDL: 65.3 mg/dL (ref >39.00)
LDL Cholesterol: 158 mg/dL — ABNORMAL HIGH (ref 0–99)
NonHDL: 173.35
Total CHOL/HDL Ratio: 4
Triglycerides: 75 mg/dL (ref 0.0–149.0)
VLDL: 15 mg/dL (ref 0.0–40.0)

## 2023-07-24 LAB — TSH: TSH: 0.63 u[IU]/mL (ref 0.35–5.50)

## 2023-07-24 LAB — HEMOGLOBIN A1C: Hgb A1c MFr Bld: 6.7 % — ABNORMAL HIGH (ref 4.6–6.5)

## 2023-07-24 LAB — VITAMIN B12: Vitamin B-12: >1500 pg/mL — ABNORMAL HIGH (ref 211–911)

## 2023-07-24 LAB — VITAMIN D 25 HYDROXY (VIT D DEFICIENCY, FRACTURES): VITD: 27.6 ng/mL — ABNORMAL LOW (ref 30.00–100.00)

## 2023-07-25 ENCOUNTER — Ambulatory Visit: Payer: Self-pay | Admitting: Family Medicine

## 2023-07-28 NOTE — Progress Notes (Signed)
 See mychart note Dear Misty Snyder, Your lab tests show multiple mild concerns: Your A1c is now in the diabetic range (the cut off is 6.5 and you are at 6.7). I know you are improving your diet: low sugar, low carb. this will be important to prevent this from worsening and needing medications.  As this is a new diagnosis, please schedule a visit with me in 3 months to recheck the number and discuss further. We need to consider lowering your cholesterol with medication as well.  Your vitamin D  is low, are you taking Vit D daily? If not, please start with 1000-2000 units daily; if you are, then double your current dose. And you can decrease your vit B12 to 3x/week.  I will see you in 3 months.  Take care. Sincerely, Dr. Jodie

## 2023-08-21 ENCOUNTER — Ambulatory Visit: Payer: 59 | Admitting: Dermatology

## 2023-10-23 ENCOUNTER — Ambulatory Visit (INDEPENDENT_AMBULATORY_CARE_PROVIDER_SITE_OTHER)

## 2023-10-23 ENCOUNTER — Ambulatory Visit: Admitting: Family Medicine

## 2023-10-23 DIAGNOSIS — Z23 Encounter for immunization: Secondary | ICD-10-CM

## 2023-10-23 NOTE — Progress Notes (Signed)
 Pt came in on the Nurse schedule to receive Flu shot. Per Dr. Jodie. Administered in the Rt deltoid without any complaints.

## 2023-11-04 ENCOUNTER — Other Ambulatory Visit: Payer: Self-pay | Admitting: Family Medicine

## 2023-11-05 ENCOUNTER — Other Ambulatory Visit: Payer: Self-pay | Admitting: Family

## 2023-11-05 DIAGNOSIS — R053 Chronic cough: Secondary | ICD-10-CM

## 2023-11-09 ENCOUNTER — Ambulatory Visit: Admitting: Family Medicine

## 2023-11-09 VITALS — BP 138/84 | HR 84 | Temp 97.7°F | Ht 63.0 in | Wt 178.0 lb

## 2023-11-09 DIAGNOSIS — E119 Type 2 diabetes mellitus without complications: Secondary | ICD-10-CM | POA: Insufficient documentation

## 2023-11-09 DIAGNOSIS — E1169 Type 2 diabetes mellitus with other specified complication: Secondary | ICD-10-CM | POA: Diagnosis not present

## 2023-11-09 DIAGNOSIS — E782 Mixed hyperlipidemia: Secondary | ICD-10-CM | POA: Diagnosis not present

## 2023-11-09 DIAGNOSIS — E559 Vitamin D deficiency, unspecified: Secondary | ICD-10-CM | POA: Diagnosis not present

## 2023-11-09 LAB — POCT GLYCOSYLATED HEMOGLOBIN (HGB A1C): Hemoglobin A1C: 6.2 % — AB (ref 4.0–5.6)

## 2023-11-09 MED ORDER — ROSUVASTATIN CALCIUM 10 MG PO TABS: 10.0000 mg | ORAL_TABLET | Freq: Every day | ORAL | 3 refills | Status: AC

## 2023-11-09 NOTE — Patient Instructions (Addendum)
 Please return in 3 months for diabetes follow up   If you have any questions or concerns, please don't hesitate to send me a message via MyChart or call the office at [phone]. Thank you for visiting with us  today! It's our pleasure caring for you.    VISIT SUMMARY: Today, we reviewed your diabetes management, cholesterol levels, sleep issues, and supplement use. Your diabetes control has improved significantly, and we discussed further steps to maintain this progress. We also addressed your cholesterol levels and recommended starting a new medication. Additionally, we talked about your sleep disturbances and current supplement regimen.  YOUR PLAN: -TYPE 2 DIABETES MELLITUS: Type 2 diabetes is a condition where your body does not use insulin properly, leading to high blood sugar levels. Your blood sugar control has improved, with your A1c dropping from 6.7% to 6.2%. Continue your current exercise routine of walking three days a week and maintain your dietary changes, especially reducing sugar intake. Please provide a urine sample to check for early signs of kidney damage.  We also recommend a diabetic eye exam.  Please schedule.  -HYPERLIPIDEMIA: Hyperlipidemia means you have high levels of cholesterol in your blood. Your LDL cholesterol is currently at 158 mg/dL, and our goal is to get it below 80 mg/dL. We recommend starting rosuvastatin (Crestor) with one pill nightly to help lower your LDL cholesterol. Watch for any side effects like body aches or joint pain and let us  know if you experience any.  -VITAMIN D  DEFICIENCY: Vitamin D  deficiency means you have lower than normal levels of vitamin D , which is important for bone health. Continue taking your current vitamin D  supplements, and we will recheck your levels at your next visit.  -GENERAL HEALTH MAINTENANCE: We discussed the importance of getting the pneumococcal vaccine to protect against infections, especially since you have diabetes. You  have already received your flu vaccine. We recommend getting the pneumococcal vaccine at a future visit.  INSTRUCTIONS: Please provide a urine sample to monitor for proteinuria. Start taking rosuvastatin (Crestor) one pill nightly and monitor for any side effects. We will recheck your vitamin D  levels at your next visit. Plan to get the pneumococcal vaccine at a future visit.                      Contains text generated by [employer].                                 Contains text generated by [employer].

## 2023-11-09 NOTE — Progress Notes (Signed)
 Subjective  CC:  Chief Complaint  Patient presents with   Diabetes    HPI: Misty Snyder is a 63 y.o. female who presents to the office today for follow up of diabetes and problems listed above in the chief complaint.  Discussed the use of AI scribe software for clinical note transcription with the patient, who gave verbal consent to proceed.  History of Present Illness Misty Snyder is a 63 year old female with type 2 diabetes who presents for follow-up on diabetes management.  New onset type 2 diabetes: A1c in July 6 0.7 - Type 2 diabetes with significant improvement in glycemic control, A1c decreased from 6.7 in July to 6.2 currently - Eliminated orange juice from diet - Reduced intake of bread and pasta - Increased consumption of leafy vegetables, nuts, and water - Stopped consuming potato chips, previously eaten in large quantities - Fondness for sugary foods and drinks, including chocolate milk and grapes, but making efforts to reduce sugar intake, gummy bears - Switched from orange juice to no-sugar-added grape juice  - No symptoms of hyperglycemia- -eligible for Prevnar 20 but defers today.  She did have her flu shot. - Has been normotensive.  Not on an ACE inhibitor.  Needs urine nephropathy screening.  Next hyperlipidemia: LDL above goal  - Discussed dietary changes  Sleep disturbance, chronic insomnia trazodone , usually well-controlled - Poor sleep over the past three nights - Tossing and turning at night - No reported stress - Believes poor sleep may have contributed to higher than normal blood pressure reading today  Supplement use - Current supplementation includes vitamin B, potassium, magnesium, and vitamin D  - Increased vitamin D  intake to four units after previously not taking any, vitamin D  deficiency    Wt Readings from Last 3 Encounters:  11/09/23 178 lb (80.7 kg)  07/23/23 172 lb 9.6 oz (78.3 kg)  05/09/23 176 lb 6 oz (80 kg)    BP  Readings from Last 3 Encounters:  11/09/23 138/84  07/23/23 118/76  05/09/23 115/73    Assessment  1. Diabetes mellitus without complication (HCC)   2. Combined hyperlipidemia associated with type 2 diabetes mellitus (HCC)   3. Vitamin D  deficiency      Plan  Assessment and Plan Assessment & Plan Type 2 diabetes mellitus Type 2 diabetes mellitus with improved control. Hemoglobin A1c decreased from 6.7% in July to 6.2% today, indicating significant improvement. She has been engaging in regular physical activity and dietary modifications, including walking three days a week and reducing intake of high-sugar foods and drinks. - Continue current exercise regimen of walking three days a week. - Maintain dietary changes, including reducing sugar intake and avoiding sugary drinks.  Had long discussion on dietary recommended changes. - Provide urine sample to monitor for proteinuria as a sign of early kidney damage.  Patient left without leaving a sample, will get next visit. Monitor blood pressure-, low threshold to start ACE inhibitor.   Hyperlipidemia Elevated cholesterol levels with LDL at 158 mg/dL. With diabetes, LDL should ideally be under 80 mg/dL. Dietary changes alone may not be sufficient to achieve target LDL levels. Rosuvastatin (Crestor) is recommended to lower LDL cholesterol. Most people tolerate it well, but some may experience body aches or joint pain. - Start rosuvastatin (Crestor) 10 mg 1 pill nightly to lower LDL cholesterol. - Monitor for potential side effects such as body aches or joint pain and report if she occurs.  Recheck in 3 months  Vitamin D   deficiency Vitamin D  deficiency previously identified. She is currently taking vitamin D  supplements.  Now taking 4000 units daily - Continue current vitamin D  supplementation. - Recheck vitamin D  levels at next visit.  General Health Maintenance Discussed the importance of pneumococcal vaccination due to increased risk of  complications from infections in diabetics. She has already received the flu vaccine. Pneumococcal vaccination is recommended to be administered at a future visit. - Recommend pneumococcal vaccination, to be administered at a future visit.    Follow up: 3 months to recheck diabetes Orders Placed This Encounter  Procedures   POCT HgB A1C   Meds ordered this encounter  Medications   rosuvastatin (CRESTOR) 10 MG tablet    Sig: Take 1 tablet (10 mg total) by mouth daily.    Dispense:  90 tablet    Refill:  3      Immunization History  Administered Date(s) Administered   Influenza Inj Mdck Quad Pf 11/12/2021   Influenza Whole 11/19/2008, 10/08/2009   Influenza, Seasonal, Injecte, Preservative Fre 10/23/2023   Influenza,inj,Quad PF,6+ Mos 10/16/2013, 12/02/2014, 10/24/2018, 10/10/2019, 10/25/2020   Influenza,trivalent, recombinat, inj, PF 10/08/2022   Influenza-Unspecified 11/09/2015   Pneumococcal Polysaccharide-23 02/16/2014   Td 10/08/2009   Tdap 10/10/2019   Zoster Recombinant(Shingrix) 04/26/2020, 07/01/2020    Diabetes Related Lab Review: Lab Results  Component Value Date   HGBA1C 6.2 (A) 11/09/2023   HGBA1C 6.7 (H) 07/23/2023    No results found for: MACKEY CURRENT Lab Results  Component Value Date   CREATININE 0.75 07/23/2023   BUN 12 07/23/2023   NA 140 07/23/2023   K 3.4 (L) 07/23/2023   CL 103 07/23/2023   CO2 28 07/23/2023   Lab Results  Component Value Date   CHOL 239 (H) 07/23/2023   CHOL 226 (H) 06/21/2022   CHOL 230 (H) 05/12/2021   Lab Results  Component Value Date   HDL 65.30 07/23/2023   HDL 63.10 06/21/2022   HDL 61.50 05/12/2021   Lab Results  Component Value Date   LDLCALC 158 (H) 07/23/2023   LDLCALC 149 (H) 06/21/2022   LDLCALC 148 (H) 05/12/2021   Lab Results  Component Value Date   TRIG 75.0 07/23/2023   TRIG 70.0 06/21/2022   TRIG 101.0 05/12/2021   Lab Results  Component Value Date   CHOLHDL 4 07/23/2023    CHOLHDL 4 06/21/2022   CHOLHDL 4 05/12/2021   Lab Results  Component Value Date   LDLDIRECT 140.6 12/13/2011   The 10-year ASCVD risk score (Arnett DK, et al., 2019) is: 18.5%   Values used to calculate the score:     Age: 64 years     Clincally relevant sex: Female     Is Non-Hispanic African American: Yes     Diabetic: Yes     Tobacco smoker: No     Systolic Blood Pressure: 138 mmHg     Is BP treated: No     HDL Cholesterol: 65.3 mg/dL     Total Cholesterol: 239 mg/dL I have reviewed the PMH, Fam and Soc history. 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 Aneita - Reflux esophagitis with no bleeding. Biopsied. - No endoscopic esophageal abnormality to explain patient's dysphagia. Esophagus dilated. - Multiple gastric polyps. Biopsied. - Granular, darkend, speckled mucosa in the second portion of the duodenum. Biopsied. - Normal duodenal bulb    Esophageal dysmotility 06/14/2016    Priority: Medium    Constipation, chronic 04/29/2015    Priority: Medium    Insomnia due to medical condition 05/07/2012    Priority: Medium     Menopausal and neuropathy. Started trazadone in 2021    Uterine fibroid 11/19/2008    Priority: Medium    Vitamin D  deficiency 11/09/2023    Priority: Low   Atrophic vaginitis 02/25/2019    Priority: Low   Vasomotor symptoms due to menopause 02/25/2019    Priority: Low   Recurrent depression 05/11/2021   Genetic testing 06/25/2014    Negative;  No pathogenic mutations BreastNext (17 genes) @ [facility]: ATM, BARD1, BRCA1, BRCA2, BRIP1, CDH1, CHEK2, MRE11A, MUTYH, NBN, NF1, PALB2, PTEN, RAD50, RAD51C, RAD51D, and TP53. Tested 06/03/14     Social History: Patient  reports that she has never smoked. She has never used smokeless tobacco. She reports that she does not drink alcohol and does not use drugs.  Review of Systems: Ophthalmic: negative for eye pain, loss of vision or double vision Cardiovascular: negative for chest pain Respiratory: negative for SOB or persistent cough Gastrointestinal: negative for abdominal pain Genitourinary: negative for dysuria or gross hematuria MSK: negative for foot lesions Neurologic: negative for weakness or gait disturbance  Objective  Vitals: BP 138/84   Pulse 84   Temp 97.7 F (36.5 C)   Ht 5' 3 (1.6 m)   Wt 178 lb (80.7 kg)   LMP 03/17/2011   SpO2 95%   BMI 31.53 kg/m  General: well appearing, no acute distress  Psych:  Alert and oriented, normal mood and affect Foot exam: no erythema, pallor, or cyanosis visible nl proprioception and sensation to monofilament testing bilaterally, +2 distal pulses bilaterally  Diabetic education: ongoing education regarding chronic disease management for diabetes was given today. We continue to reinforce the ABC's of diabetic management: A1c (<7 or 8 dependent upon patient), tight blood pressure control, and cholesterol management with goal LDL < 100 minimally. We discuss diet strategies, exercise recommendations, medication options and possible side effects. At each visit, we review recommended immunizations and preventive care recommendations for diabetics and stress that good diabetic control can prevent other problems. See below for this patient's data. Commons side effects, risks, benefits, and alternatives for medications and treatment plan prescribed today were discussed, and the patient expressed understanding of the given instructions. Patient is  instructed to call or message via MyChart if he/she has any questions or concerns regarding our treatment plan. No barriers to understanding were identified. We discussed Red Flag symptoms and signs in detail. Patient expressed understanding regarding what to do in case of urgent or emergency type symptoms.  Medication list was reconciled, printed and provided to the patient in AVS. Patient instructions and summary information was reviewed with the patient as documented in the AVS. This note was prepared with assistance of Dragon voice recognition software. Occasional wrong-word or sound-a-like substitutions may have occurred due to the inherent limitations of voice recognition software

## 2024-01-18 ENCOUNTER — Ambulatory Visit: Payer: Self-pay

## 2024-01-18 ENCOUNTER — Telehealth: Payer: Self-pay | Admitting: Family Medicine

## 2024-01-18 NOTE — Telephone Encounter (Signed)
 Currently being Triaged 01/18/24-LA

## 2024-01-18 NOTE — Telephone Encounter (Signed)
 Scheduled with hudnell

## 2024-01-18 NOTE — Telephone Encounter (Signed)
 FYI Only or Action Required?: FYI only for provider: appointment scheduled on 01/23/24.  Patient was last seen in primary care on 11/09/2023 by Jodie Lavern CROME, MD.  Called Nurse Triage reporting Dizziness and Headache.  Symptoms began several weeks ago.  Interventions attempted: Rest, hydration, or home remedies.  Symptoms are: gradually improving.  Triage Disposition: See PCP Within 2 Weeks  Patient/caregiver understands and will follow disposition?:     Reason for Disposition  [1] MILD dizziness (e.g., vertigo; walking normally) AND [2] has been evaluated by doctor (or NP/PA) for this  [1] Headache AND [2] has not taken pain medications  Answer Assessment - Initial Assessment Questions 1. LOCATION: Where does it hurt?      Front-nasal 2. ONSET: When did the headache start? (e.g., minutes, hours, days)      Last week  3. PATTERN: Does the pain come and go, or has it been constant since it started?     Intermittent  4. SEVERITY: How bad is the pain? and What does it keep you from doing?  (e.g., Scale 1-10; mild, moderate, or severe)     4/10 5. RECURRENT SYMPTOM: Have you ever had headaches before? If Yes, ask: When was the last time? and What happened that time?      yes 6. CAUSE: What do you think is causing the headache?     Inner ear  7. MIGRAINE: Have you been diagnosed with migraine headaches? If Yes, ask: Is this headache similar?      denies 8. HEAD INJURY: Has there been any recent injury to your head?      denies 9. OTHER SYMPTOMS: Do you have any other symptoms? (e.g., fever, stiff neck, eye pain, sore throat, cold symptoms)     Intermittent dizziness-denies tingling, vision changes, weakness, last week had SOB, morning off balance that resolves while she is up walking around.  10. PREGNANCY: Is there any chance you are pregnant? When was your last menstrual period?  Answer Assessment - Initial Assessment Questions 1. DESCRIPTION:  Describe your dizziness.     Off balance in the morning 2. VERTIGO: Do you feel like either you or the room is spinning or tilting?      Off balance in morning 3. LIGHTHEADED: Do you feel lightheaded? (e.g., somewhat faint, woozy, weak upon standing)     denies 4. SEVERITY: How bad is it?  Can you walk?     mild 5. ONSET:  When did the dizziness begin?     Several weeks. Not currently experiencing, last week last episode 6. AGGRAVATING FACTORS: Does anything make it worse? (e.g., standing, change in head position)     Getting out of bed in the morning  7. CAUSE: What do you think is causing the dizziness?     Inner ear 8. RECURRENT SYMPTOM: Have you had dizziness before? If Yes, ask: When was the last time? What happened that time?     Yes-history of vertigo-used to take meclizine  but does not like side effects 9. OTHER SYMPTOMS: Do you have any other symptoms? (e.g., earache, headache, numbness, tinnitus, vomiting, weakness)     Last week short of breath this has resolved. Current symptom mild headache 3/10 no vision changes, denies all other symptoms.  10. PREGNANCY: Is there any chance you are pregnant? When was your last menstrual period?  Protocols used: Headache-A-AH, Dizziness - Vertigo-A-AH

## 2024-01-18 NOTE — Telephone Encounter (Signed)
 noted

## 2024-01-18 NOTE — Telephone Encounter (Signed)
 Per Chart Final Disposition was to see PCP within 2 weeks. Triage RN scheduled Patient for 01/23/24 with Hudnell.

## 2024-01-18 NOTE — Telephone Encounter (Signed)
 Noted

## 2024-01-23 ENCOUNTER — Ambulatory Visit: Admitting: Family

## 2024-01-23 ENCOUNTER — Encounter: Payer: Self-pay | Admitting: Family

## 2024-01-23 VITALS — BP 112/78 | HR 84 | Temp 98.1°F | Ht 63.0 in | Wt 181.0 lb

## 2024-01-23 DIAGNOSIS — J3489 Other specified disorders of nose and nasal sinuses: Secondary | ICD-10-CM | POA: Diagnosis not present

## 2024-01-23 DIAGNOSIS — R42 Dizziness and giddiness: Secondary | ICD-10-CM

## 2024-01-23 MED ORDER — MECLIZINE HCL 12.5 MG PO TABS: 6.2500 mg | ORAL_TABLET | Freq: Three times a day (TID) | ORAL | 0 refills | Status: AC | PRN

## 2024-01-23 MED ORDER — TRIAMCINOLONE ACETONIDE 55 MCG/ACT NA AERO: 1.0000 | INHALATION_SPRAY | Freq: Every day | NASAL | 2 refills | Status: AC

## 2024-01-23 NOTE — Progress Notes (Signed)
 "  Patient ID: Misty Snyder, female    DOB: [birth year], 64 y.o.   MRN: [id number]  Chief Complaint  Patient presents with   Dizziness    Pt c/o dizziness, lightheadedness and headache. Present off and on through out the day. Has tried naproxen  and tylenol .   Discussed the use of AI scribe software for clinical note transcription with the patient, who gave verbal consent to proceed.  History of Present Illness Misty Snyder is a 64 year old female who presents with ongoing headaches and dizziness.  She has had headaches for a couple of months with pain up to 4/10. Naproxen  relieves them better than Tylenol . Headaches are sometimes associated with dizziness, and she has vertigo in the past.  Last Monday she had severe dizziness and lightheadedness that forced her to leave a funeral and she was unable to drive home. She has daily dizziness, worse in the mornings, with balance problems and frequent bumping into objects. She recently had nasal pressure and a runny nose. She tried meclizine  in the past but it caused excessive drowsiness and she stopped it. She has not tried saline or intranasal steroids such as Flonase or Nasacort . She walks about four miles three days a week and drinks water regularly, though a nurse recently suggested dehydration as a possible factor. She denies stress. She reports normal blood pressure and is not on antihypertensive medication. She takes trazodone  100 mg nightly for sleep. She has cataracts with poor night vision and currently relies on others for transportation while awaiting surgery due to insurance copay issues. She is retired and works part-time at [employer].  Assessment & Plan Vertigo and headache, likely multifactorial (possible sinusitis) Persistent headache with recent exacerbation causing dizziness and vertigo. Possible sinusitis due to nasal pressure and headache location. Differential includes stress, anxiety, dehydration, and sinusitis.  Cataracts present but not addressed yet due to copay issues. Meclizine  previously caused drowsiness. - Prescribed meclizine  12.5 mg, advised to take half a tablet initially to assess drowsiness, dose ok to take 2h prior to Trazodone . - Recommended saline nasal spray several times daily for disinfecting. - Prescribed generic Nasacort  nasal spray, one squirt each side twice daily, reduce to once daily after 2-3d then as needed as symptoms improve. - Encouraged hydration with five 16.9 oz water bottles daily. - Discussed potential Toradol  injection if headaches persist. - Advised to monitor symptoms and report if no improvement.  Subjective:    Outpatient Medications Prior to Visit  Medication Sig Dispense Refill   clobetasol  (TEMOVATE ) 0.05 % external solution Apply 1 Application topically 2 (two) times daily as needed. 50 mL 0   Fluocinolone  Acetonide Body (DERMA-SMOOTHE /FS BODY) 0.01 % OIL Apply to the hair and scalp the night prior to washing hair. Rinse out with hair washing. 118.28 mL 0   gabapentin  (NEURONTIN ) 300 MG capsule TAKE 1 CAPSULE BY MOUTH TWICE A DAY 180 capsule 3   naproxen  (NAPROSYN ) 500 MG tablet TAKE 1 TABLET BY MOUTH TWICE A DAY AS NEEDED 60 tablet 0   pantoprazole  (PROTONIX ) 40 MG tablet Take 1 tablet (40 mg total) by mouth 2 (two) times daily. 180 tablet 3   rosuvastatin  (CRESTOR ) 10 MG tablet Take 1 tablet (10 mg total) by mouth daily. 90 tablet 3   Safety Seal Miscellaneous MISC AA Gel with minoxidil USP 10% and clobetasol  USP 0.05% - use daily QAM on affected areas 30 g 2   traZODone  (DESYREL ) 50 MG tablet TAKE 2 TABLETS (100 MG TOTAL) BY  MOUTH AT BEDTIME AS NEEDED FOR SLEEP. 180 tablet 3   No facility-administered medications prior to visit.   Past Medical History:  Diagnosis Date   Breast cancer (HCC) 05/15/14   right breast invasive ductal carcinoma   Depression    Personal history of chemotherapy    Reflux esophagitis 03/03/2019   egd 02/2019   Tubular adenoma  of colon 03/03/2019   Colonoscopy 02/2019   Uterine fibroid    Past Surgical History:  Procedure Laterality Date   INSERTION CENTRAL VENOUS ACCESS DEVICE W/ SUBCUTANEOUS PORT  06/18/2014   MASTECTOMY Right 2016   MASTECTOMY W/ SENTINEL NODE BIOPSY Right 06/18/2014   WITH PORT A CATH PLACEMENT   MASTECTOMY W/ SENTINEL NODE BIOPSY Right 06/17/2014   Procedure: RIGHT MASTECTOMY WITH SENTINEL LYMPH NODE BIOPSY WITH BLUE DYE MAPPING AND PORT PLACEMENT;  Surgeon: Donnice Lima, MD;  Location: [facility] OR;  Service: General;  Laterality: Right;   NECK SURGERY     PORTACATH PLACEMENT Left 06/17/2014   Procedure: INSERTION PORT-A-CATH;  Surgeon: Donnice Lima, MD;  Location: [facility] OR;  Service: General;  Laterality: Left;   Allergies[1]    Objective:    Physical Exam Vitals and nursing note reviewed.  Constitutional:      Appearance: Normal appearance.  Cardiovascular:     Rate and Rhythm: Normal rate and regular rhythm.  Pulmonary:     Effort: Pulmonary effort is normal.     Breath sounds: Normal breath sounds.  Musculoskeletal:        General: Normal range of motion.  Skin:    General: Skin is warm and dry.  Neurological:     Mental Status: She is alert.  Psychiatric:        Mood and Affect: Mood normal.        Behavior: Behavior normal.    BP 112/78 (BP Location: Left Arm, Patient Position: Sitting, Cuff Size: Normal)   Pulse 84   Temp 98.1 F (36.7 C) (Temporal)   Ht 5' 3 (1.6 m)   Wt 181 lb (82.1 kg)   LMP 03/17/2011   SpO2 95%   BMI 32.06 kg/m  Wt Readings from Last 3 Encounters:  01/23/24 181 lb (82.1 kg)  11/09/23 178 lb (80.7 kg)  07/23/23 172 lb 9.6 oz (78.3 kg)      [name], NP     [1] No Known Allergies  "

## 2024-02-11 ENCOUNTER — Ambulatory Visit: Admitting: Family Medicine
